# Patient Record
Sex: Female | Born: 1950
Health system: Southern US, Community
[De-identification: ages and names within clinical notes are randomized; demographics above are authoritative.]

## PROBLEM LIST (undated history)

## (undated) DIAGNOSIS — R011 Cardiac murmur, unspecified: Secondary | ICD-10-CM

## (undated) DIAGNOSIS — B019 Varicella without complication: Secondary | ICD-10-CM

## (undated) DIAGNOSIS — M1990 Unspecified osteoarthritis, unspecified site: Secondary | ICD-10-CM

## (undated) DIAGNOSIS — G43909 Migraine, unspecified, not intractable, without status migrainosus: Secondary | ICD-10-CM

## (undated) DIAGNOSIS — J45909 Unspecified asthma, uncomplicated: Secondary | ICD-10-CM

## (undated) DIAGNOSIS — T7840XA Allergy, unspecified, initial encounter: Secondary | ICD-10-CM

## (undated) DIAGNOSIS — I639 Cerebral infarction, unspecified: Secondary | ICD-10-CM

## (undated) HISTORY — DX: Allergy, unspecified, initial encounter: T78.40XA

## (undated) HISTORY — PX: COLONOSCOPY: SHX174

## (undated) HISTORY — DX: Unspecified osteoarthritis, unspecified site: M19.90

## (undated) HISTORY — DX: Varicella without complication: B01.9

## (undated) HISTORY — DX: Cerebral infarction, unspecified: I63.9

## (undated) HISTORY — DX: Cardiac murmur, unspecified: R01.1

## (undated) HISTORY — DX: Migraine, unspecified, not intractable, without status migrainosus: G43.909

## (undated) HISTORY — DX: Unspecified asthma, uncomplicated: J45.909

---
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
## 2017-03-23 ENCOUNTER — Ambulatory Visit (INDEPENDENT_AMBULATORY_CARE_PROVIDER_SITE_OTHER): Payer: BLUE CROSS/BLUE SHIELD | Admitting: Family Medicine

## 2017-03-23 ENCOUNTER — Encounter: Payer: Self-pay | Admitting: Gastroenterology

## 2017-03-23 ENCOUNTER — Encounter: Payer: Self-pay | Admitting: Family Medicine

## 2017-03-23 VITALS — BP 136/80 | HR 97 | Resp 12 | Ht 66.93 in | Wt 165.2 lb

## 2017-03-23 DIAGNOSIS — Z13220 Encounter for screening for lipoid disorders: Secondary | ICD-10-CM

## 2017-03-23 DIAGNOSIS — M7541 Impingement syndrome of right shoulder: Secondary | ICD-10-CM

## 2017-03-23 DIAGNOSIS — Z131 Encounter for screening for diabetes mellitus: Secondary | ICD-10-CM | POA: Diagnosis not present

## 2017-03-23 DIAGNOSIS — M25511 Pain in right shoulder: Secondary | ICD-10-CM

## 2017-03-23 DIAGNOSIS — Z1211 Encounter for screening for malignant neoplasm of colon: Secondary | ICD-10-CM

## 2017-03-23 LAB — BASIC METABOLIC PANEL
BUN: 14 mg/dL (ref 6–23)
CO2: 27 mEq/L (ref 19–32)
Calcium: 9.5 mg/dL (ref 8.4–10.5)
Chloride: 105 mEq/L (ref 96–112)
Creatinine, Ser: 0.74 mg/dL (ref 0.40–1.20)
GFR: 101.37 mL/min (ref >60.00)
Glucose, Bld: 110 mg/dL — ABNORMAL HIGH (ref 70–99)
Potassium: 4 mEq/L (ref 3.5–5.1)
Sodium: 140 mEq/L (ref 135–145)

## 2017-03-23 LAB — LIPID PANEL
Cholesterol: 226 mg/dL — ABNORMAL HIGH (ref 0–200)
HDL: 58.9 mg/dL (ref >39.00)
LDL Cholesterol: 141 mg/dL — ABNORMAL HIGH (ref 0–99)
NonHDL: 166.96
Total CHOL/HDL Ratio: 4
Triglycerides: 130 mg/dL (ref 0.0–149.0)
VLDL: 26 mg/dL (ref 0.0–40.0)

## 2017-03-23 LAB — CBC
HCT: 44 % (ref 36.0–46.0)
Hemoglobin: 14.2 g/dL (ref 12.0–15.0)
MCHC: 32.2 g/dL (ref 30.0–36.0)
MCV: 81.3 fl (ref 78.0–100.0)
Platelets: 229 10*3/uL (ref 150.0–400.0)
RBC: 5.42 Mil/uL — ABNORMAL HIGH (ref 3.87–5.11)
RDW: 13.8 % (ref 11.5–15.5)
WBC: 6.3 10*3/uL (ref 4.0–10.5)

## 2017-03-23 NOTE — Progress Notes (Addendum)
HPI:   Leah Pierce is a 66 y.o. female, who is here today with her female friend to establish care.  Former PCP: Dr Terri Skains in [place]. Moved to this area in 02/2016.  Last preventive routine visit: 2016 Colonoscopy 2006-2007  Chronic medical problems: asthma and migraine headaches.   Concerns today: Shoulder pain.  Having right shoulder pain since 12/2016. She was trying to "undo" her bra when she felt a "pop" and sudden pain. Pain is intermittent, throbbing,exacerbated by picking heavy objects and alleviated by rest. It is mild, getting better but concerned because it is not resolved. Sometimes pain is radiated to right trapezium and cervical area as well as mid right arm. Occasionally tingling sensation on fingertips,bilateral.  She denies weakness.  She has not tried OTC medication.  -She is fating today and would like labs done, lipid and diabetes screening.   Review of Systems  Constitutional: Negative for activity change, appetite change, fatigue, fever and unexpected weight change.  HENT: Negative for mouth sores, nosebleeds and trouble swallowing.   Eyes: Negative for redness and visual disturbance.  Respiratory: Negative for cough, shortness of breath and wheezing.   Cardiovascular: Negative for chest pain, palpitations and leg swelling.  Gastrointestinal: Negative for abdominal pain, nausea and vomiting.       Negative for changes in bowel habits.  Endocrine: Negative for cold intolerance, heat intolerance, polydipsia, polyphagia and polyuria.  Genitourinary: Negative for decreased urine volume and hematuria.  Musculoskeletal: Positive for arthralgias and neck pain. Negative for joint swelling.  Skin: Negative for color change and rash.  Allergic/Immunologic: Positive for environmental allergies.  Neurological: Negative for syncope, weakness and headaches.  Hematological: Negative for adenopathy. Does not bruise/bleed easily.    Psychiatric/Behavioral: Negative for confusion and sleep disturbance. The patient is not nervous/anxious.      No current outpatient prescriptions on file prior to visit.   No current facility-administered medications on file prior to visit.      Past Medical History:  Diagnosis Date  . Allergy   . Asthma   . Chicken pox   . Migraines    Not on File  Family History  Problem Relation Age of Onset  . Arthritis Mother   . Stroke Mother   . Hypertension Mother   . Diabetes Mother   . Arthritis Father   . Cancer Father     Prostate  . Cancer Brother     prostate  . Cancer Brother     Pancreas    Social History   Social History  . Marital status: Single    Spouse name: N/A  . Number of children: N/A  . Years of education: N/A   Social History Main Topics  . Smoking status: Never Smoker  . Smokeless tobacco: Never Used  . Alcohol use Yes  . Drug use: No  . Sexual activity: Not Asked   Other Topics Concern  . None   Social History Narrative  . None    Vitals:   03/23/17 0824  BP: 136/80  Pulse: 97  Resp: 12  O2 sat at RA 98%  Body mass index is 25.94 kg/m.   Physical Exam  Nursing note and vitals reviewed. Constitutional: She is oriented to person, place, and time. She appears well-developed and well-nourished. No distress.  HENT:  Head: Atraumatic.  Mouth/Throat: Oropharynx is clear and moist and mucous membranes are normal.  Eyes: Conjunctivae and EOM are normal.  Neck: No tracheal deviation present. No  thyroid mass and no thyromegaly present.  Cardiovascular: Normal rate and regular rhythm.   No murmur heard. Pulses:      Dorsalis pedis pulses are 2+ on the right side, and 2+ on the left side.  Respiratory: Effort normal and breath sounds normal. No respiratory distress.  GI: Soft. She exhibits no mass. There is no hepatomegaly. There is no tenderness.  Musculoskeletal: She exhibits no edema.  Right shoulder: Pain elicited with Juanetta Gosling'  test, empty can supraspinatus test,lift-Off Subscapularis test.  ROM elicits pain. Active abduction mildly limited, passive movement normal. Mild pain upon palpation of right trapezium muscle. No tenderness upon palpation of cervical paraspinal muscles and no limitation of ROM.  Lymphadenopathy:    She has no cervical adenopathy.  Neurological: She is alert and oriented to person, place, and time. She has normal strength. Gait normal.  Skin: Skin is warm. No rash noted. No erythema.  Psychiatric: She has a normal mood and affect.  Well groomed, good eye contact.     ASSESSMENT AND PLAN:   Leah Pierce was seen today for establish care.  Diagnoses and all orders for this visit:  Lab Results  Component Value Date   WBC 6.3 03/23/2017   HGB 14.2 03/23/2017   HCT 44.0 03/23/2017   MCV 81.3 03/23/2017   PLT 229.0 03/23/2017   Lab Results  Component Value Date   CHOL 226 (H) 03/23/2017   HDL 58.90 03/23/2017   LDLCALC 141 (H) 03/23/2017   TRIG 130.0 03/23/2017   CHOLHDL 4 03/23/2017   Lab Results  Component Value Date   CREATININE 0.74 03/23/2017   BUN 14 03/23/2017   NA 140 03/23/2017   K 4.0 03/23/2017   CL 105 03/23/2017   CO2 27 03/23/2017    Right shoulder pain, unspecified chronicity  Improving. Possible causes discussed: OA, rotator cuff tendinitis,radicular pain among some. Further recommendations will be given according to imaging results.   -     DG Shoulder Right; Future -     DG Cervical Spine Complete; Future -     CBC -     Ambulatory referral to Physical Therapy  Impingement syndrome, shoulder, right  She agrees with trying PT evaluation and treatment. OTC Tylenol, NSAID's, or topical Icy hot may help.  -     Ambulatory referral to Physical Therapy  Diabetes mellitus screening -     Basic metabolic panel  Lipid screening -     Lipid panel  Colon cancer screening -     Ambulatory referral to Gastroenterology   Next OV CPE.    Leah G.  [place], MD  [facility] [place] office.

## 2017-03-23 NOTE — Progress Notes (Signed)
Pre visit review using our clinic review tool, if applicable. No additional management support is needed unless otherwise documented below in the visit note. 

## 2017-03-23 NOTE — Patient Instructions (Signed)
A few things to remember from today's visit:   Right shoulder pain, unspecified chronicity - Plan: DG Shoulder Right, DG Cervical Spine Complete, CBC, Ambulatory referral to Physical Therapy  Impingement syndrome, shoulder, right - Plan: Ambulatory referral to Physical Therapy  Diabetes mellitus screening - Plan: Basic metabolic panel  Lipid screening - Plan: Lipid panel  Colon cancer screening - Plan: Ambulatory referral to Gastroenterology   Please be sure medication list is accurate. If a new problem present, please set up appointment sooner than planned today.

## 2017-03-24 ENCOUNTER — Ambulatory Visit (INDEPENDENT_AMBULATORY_CARE_PROVIDER_SITE_OTHER)
Admission: RE | Admit: 2017-03-24 | Discharge: 2017-03-24 | Disposition: A | Discharge status: Home / Self Care | Payer: BLUE CROSS/BLUE SHIELD | Source: Ambulatory Visit | Attending: Family Medicine | Admitting: Family Medicine

## 2017-03-24 DIAGNOSIS — M25511 Pain in right shoulder: Secondary | ICD-10-CM

## 2017-03-31 ENCOUNTER — Ambulatory Visit: Payer: BLUE CROSS/BLUE SHIELD | Attending: Family Medicine

## 2017-03-31 DIAGNOSIS — M25511 Pain in right shoulder: Secondary | ICD-10-CM | POA: Insufficient documentation

## 2017-03-31 DIAGNOSIS — R293 Abnormal posture: Secondary | ICD-10-CM

## 2017-03-31 DIAGNOSIS — G8929 Other chronic pain: Secondary | ICD-10-CM | POA: Insufficient documentation

## 2017-03-31 DIAGNOSIS — M25611 Stiffness of right shoulder, not elsewhere classified: Secondary | ICD-10-CM | POA: Insufficient documentation

## 2017-03-31 NOTE — Therapy (Signed)
[facility] [address], [place], [place], [address] Phone: [phone]   Fax:  [phone]  Physical Therapy Evaluation  Patient Details  Name: Leah Pierce MRN: [id number] Date of Birth: 1951-02-20 Referring Provider: Martinique, Betty, MD  Encounter Date: 03/31/2017      PT End of Session - 03/31/17 1222    Visit Number 1   Date for PT Re-Evaluation 05/26/17   PT Start Time 1134   PT Stop Time 1217   PT Time Calculation (min) 43 min   Activity Tolerance Patient tolerated treatment well   Behavior During Therapy [facility] for tasks assessed/performed      Past Medical History:  Diagnosis Date  . Allergy   . Asthma   . Chicken pox   . Migraines     Past Surgical History:  Procedure Laterality Date  . TONSILLECTOMY  1959    There were no vitals filed for this visit.       Subjective Assessment - 03/31/17 1136    Subjective Pt is a Rt hand dominant female who presents to PT with compliants of Rt shoulder pain that began 12/2016.  Pt reached behind her back to fasten her bra and felt immediate pain.     Diagnostic tests x-ray of shoulder negative.  Cervical x-ray: DDD at C5-6   Patient Stated Goals reduce Rt shoulder pain, reach overhead with Rt UE, fasten bra with Rt UE   Currently in Pain? Yes   Pain Score 0-No pain  up 5/10 with aggravating activities   Pain Location Shoulder   Pain Orientation Right   Pain Descriptors / Indicators Throbbing   Pain Type Chronic pain   Pain Onset More than a month ago   Pain Frequency Intermittent   Aggravating Factors  reaching overhead, lifting/laundry, reaching behind the back   Pain Relieving Factors somteimes heat            [facility] PT Assessment - 03/31/17 0001      Assessment   Medical Diagnosis Rt shoulder pain, impingement syndrome, shoulder, right   Referring Provider Martinique, Betty, MD   Onset Date/Surgical Date 01/01/17   Hand Dominance Right    Next MD Visit 6 months     Precautions   Precautions None     Restrictions   Weight Bearing Restrictions No     Balance Screen   Has the patient fallen in the past 6 months No   Has the patient had a decrease in activity level because of a fear of falling?  No   Is the patient reluctant to leave their home because of a fear of falling?  No     Home Ecologist residence   Living Arrangements Spouse/significant other   Type of [place] Two level     Prior Function   Level of [place] Retired   Leisure [employer], walking      Cognition   Overall Cognitive Status Within Functional Limits for tasks assessed     Observation/Other Assessments   Focus on Therapeutic Outcomes (FOTO)  49% limitation     Posture/Postural Control   Posture/Postural Control Postural limitations   Postural Limitations Forward head;Rounded Shoulders     ROM / Strength   AROM / PROM / Strength AROM;PROM;Strength     AROM   Overall AROM  Deficits   Overall AROM Comments Rt=Lt shoulder flexion and abduction limited  by 25% with pain on the Rt.   AROM Assessment Site Shoulder   Right/Left Shoulder Right   Right Shoulder Internal Rotation --  lacking 9 inches vs the Lt and more lateral   Right Shoulder External Rotation --  lacking 2 inches vs the Lt     PROM   Overall PROM  Deficits   Overall PROM Comments Lt P/ROM is full   PROM Assessment Site Shoulder   Right/Left Shoulder Right   Right Shoulder Flexion 143 Degrees   Right Shoulder ABduction 83 Degrees   Right Shoulder Internal Rotation 43 Degrees   Right Shoulder External Rotation 48 Degrees     Strength   Overall Strength Deficits   Strength Assessment Site Shoulder   Right/Left Shoulder Right;Left   Right Shoulder Flexion 4/5   Right Shoulder ABduction 4-/5   Right Shoulder Internal Rotation 4+/5   Right Shoulder External Rotation 4/5   Left  Shoulder Flexion 4+/5   Left Shoulder ABduction 4+/5   Left Shoulder Internal Rotation 5/5   Left Shoulder External Rotation 5/5     Palpation   Palpation comment palpable tenderness over anterior aspect of Rt glenohumeral joint and trigger point in Rt upper traps.  Decreased inferior glide.     Ambulation/Gait   Ambulation/Gait Yes   Ambulation/Gait Assistance 7: Independent                   [facility] Adult PT Treatment/Exercise - 03/31/17 0001      Modalities   Modalities Iontophoresis     Iontophoresis   Type of Iontophoresis Dexamethasone   Location Rt anterior glenohumeral joint   Dose 1.0   #1   Time 6 hour wear                PT Education - 03/31/17 1213    Education provided Yes   Education Details shoulder A/AROM, posture education, scap squeezes, Psychologist, clinical) Educated Patient;Other (comment)   Methods Explanation;Demonstration;Handout   Comprehension Verbalized understanding;Returned demonstration          PT Short Term Goals - 03/31/17 1229      PT SHORT TERM GOAL #1   Title be independent in initial HEP   Time 4   Period Weeks   Status New     PT SHORT TERM GOAL #2   Title demonstrate Rt shoulder A/ROM IR to lacking < or = to 5 inches vs the Lt to improve self-care   Time 4   Period Weeks   Status New     PT SHORT TERM GOAL #3   Title report a 30% reduction in Rt shoulder pain with lifting and reaching   Time 4   Period Weeks   Status New     PT SHORT TERM GOAL #4   Title report 25% improvement in quality and quantity of sleep   Time 4   Period Weeks   Status New     PT SHORT TERM GOAL #5   Title demonstrate and verbalize understanding of correct posture and report making corrections at home   Time 4   Period Weeks   Status New           PT Long Term Goals - 03/31/17 1132      PT LONG TERM GOAL #1   Title be independent in advanced HEP   Time 8   Period Weeks   Status New     PT LONG TERM GOAL #2  Title reduce FOTO to < or = to 33% limitation   Time 8   Period Weeks   Status New     PT LONG TERM GOAL #3   Title report a 75% reduction in Rt shoulder pain with reaching and lifitng   Time 8   Period Weeks   Status New     PT LONG TERM GOAL #4   Title demonstrate Rt shoulder A/ROM IR to lacking < or = to 3 inches vs the Lt to improve self-care   Time 8   Period Weeks   Status New     PT LONG TERM GOAL #5   Title report no sleep limitations due to Rt shoulder pain   Time 8   Period Weeks   Status New               Plan - 03/31/17 1223    Clinical Impression Statement Pt is a Rt hand dominant female who presents to PT with Rt shoulder pain that began 12/2016 when she reached back to fasten her bra.  X-ray of neck showerd C5-6 DDD and Rt shoulder was normal.  Pt demonstrates limited Rt shoulder A/ROM, P/ROM and strength.  Pt with painful Rt shoulder mobility.  Pt with palpable tenderness over Rt anterior glenohumeral joint and trigger points in Rt upper trap.  Pt reports up to 5/10 Rt shoulder pain with lifting and reaching over head or behind the back and pt wakes with pain at night.  Pt with poor seated posture with forward shoulders and head.  Pt will benefit from skilled PT for postural strength, shoulder ROM and mobilization, ionto and modalities.     Rehab Potential Good   PT Frequency 2x / week   PT Duration 8 weeks   PT Treatment/Interventions ADLs/Self Care Home Management;Cryotherapy;Electrical Stimulation;Iontophoresis 1m/ml Dexamethasone;Ultrasound;Moist Heat;Therapeutic activities;Therapeutic exercise;Neuromuscular re-education;Patient/family education;Passive range of motion;Manual techniques;Dry needling;Taping   PT Next Visit Plan postural strength, Rt shoulder flexibility, ionto #2, shoulder mobs, modalities   Consulted and Agree with Plan of Care Patient      Patient will benefit from skilled therapeutic intervention in order to improve the following  deficits and impairments:  Decreased range of motion, Pain, Decreased endurance, Increased muscle spasms, Postural dysfunction, Decreased strength, Hypomobility, Impaired flexibility, Improper body mechanics, Decreased activity tolerance  Visit Diagnosis: Chronic right shoulder pain - Plan: PT plan of care cert/re-cert  Abnormal posture - Plan: PT plan of care cert/re-cert  Stiffness of right shoulder, not elsewhere classified - Plan: PT plan of care cert/re-cert     Problem List There are no active problems to display for this patient.    KSigurd Sos PT 03/31/17 12:38 PM  [facility] [address] [place] [address]Phone: [phone]  Fax:  [phone] Name: MCamden MazzaferroMRN: [id number] of Birth: [birth year]

## 2017-03-31 NOTE — Patient Instructions (Addendum)
Hold all stretches 5-10 seconds and perform 5-10 times, 3 times a day.   Slide right arm up wall, with  PALM FACING THE WALL, by leaning toward wall.    Copyright  VHI. All rights reserved.    Clasp hands together and raise arms above head, keeping elbows as straight as possible. Can be done sitting or lying.     One hand on opposite side of head, pull head to side as far as is comfortable. Stop if there is pain. Hold ____ seconds. Repeat with other hand to other side. Repeat ____ times. Do ____ sessions per day.   Copyright  VHI. All rights reserved.  Scapular Retraction (Standing)    Hold 5 seconds, do 10.  Many times a day.       Posture - Standing   Good posture is important. Avoid slouching and forward head thrust. Maintain curve in low back and align ears over shoulders, hips over ankles.  Pull your belly button in toward your back bone. Posture Tips DO: - stand tall and erect - keep chin tucked in - keep head and shoulders in alignment - check posture regularly in mirror or large window - pull head back against headrest in car seat;  Change your position often.  Sit with lumbar support. DON'T: - slouch or slump while watching TV or reading - sit, stand or lie in one position  for too long;  Sitting is especially hard on the spine so if you sit at a desk/use the computer, then stand up often! Copyright  VHI. All rights reserved.  Posture - Sitting  Sit upright, head facing forward. Try using a roll to support lower back. Keep shoulders relaxed, and avoid rounded back. Keep hips level with knees. Avoid crossing legs for long periods. Copyright  VHI. All rights reserved.  Chronic neck strain can develop because of poor posture and faulty work habits  Postural strain related to slumped sitting and forward head posture is a leading cause of headaches, neck and upper back pain  General strengthening and flexibility exercises are helpful in the treatment of neck pain.   Most importantly, you should learn to correct the posture that may be contributing to chronic pain.   Change positions frequently  Change your work or home environment to improve posture and mechanics.   AROM: Lateral Neck Flexion    Slowly tilt head toward one shoulder, then the other. Hold each position _20___ seconds. Repeat __3__ times per set. Do __1__ sets per session. Do _3___ sessions per day.  http://orth.exer.us/297   Copyright  VHI. All rights reserved.   IONTOPHORESIS PATIENT PRECAUTIONS & CONTRAINDICATIONS:  . Redness under one or both electrodes can occur.  This characterized by a uniform redness that usually disappears within 12 hours of treatment. . Small pinhead size blisters may result in response to the drug.  Contact your physician if the problem persists more than 24 hours. . On rare occasions, iontophoresis therapy can result in temporary skin reactions such as rash, inflammation, irritation or burns.  The skin reactions may be the result of individual sensitivity to the ionic solution used, the condition of the skin at the start of treatment, reaction to the materials in the electrodes, allergies or sensitivity to dexamethasone, or a poor connection between the patch and your skin.  Discontinue using iontophoresis if you have any of these reactions and report to your therapist. . Remove the Patch or electrodes if you have any undue sensation of pain or  burning during the treatment and report discomfort to your therapist. . Tell your Therapist if you have had known adverse reactions to the application of electrical current. . If using the Patch, the LED light will turn off when treatment is complete and the patch can be removed.  Approximate treatment time is 1-3 hours.  Remove the patch when light goes off or after 6 hours. . The Patch can be worn during normal activity, however excessive motion where the electrodes have been placed can cause poor contact between  the skin and the electrode or uneven electrical current resulting in greater risk of skin irritation. Marland Kitchen Keep out of the reach of children.   . DO NOT use if you have a cardiac pacemaker or any other electrically sensitive implanted device. . DO NOT use if you have a known sensitivity to dexamethasone. . DO NOT use during Magnetic Resonance Imaging (MRI). . DO NOT use over broken or compromised skin (e.g. sunburn, cuts, or acne) due to the increased risk of skin reaction. . DO NOT SHAVE over the area to be treated:  To establish good contact between the Patch and the skin, excessive hair may be clipped. . DO NOT place the Patch or electrodes on or over your eyes, directly over your heart, or brain. . DO NOT reuse the Patch or electrodes as this may cause burns to occur. [facility] [address], [address] [place], [place] [address] Phone # [phone] Fax [phone]

## 2017-04-01 ENCOUNTER — Encounter: Payer: Self-pay | Admitting: Physical Therapy

## 2017-04-01 ENCOUNTER — Ambulatory Visit: Payer: BLUE CROSS/BLUE SHIELD | Admitting: Physical Therapy

## 2017-04-01 DIAGNOSIS — R293 Abnormal posture: Secondary | ICD-10-CM

## 2017-04-01 DIAGNOSIS — M25611 Stiffness of right shoulder, not elsewhere classified: Secondary | ICD-10-CM

## 2017-04-01 DIAGNOSIS — M25511 Pain in right shoulder: Secondary | ICD-10-CM | POA: Diagnosis not present

## 2017-04-01 DIAGNOSIS — G8929 Other chronic pain: Secondary | ICD-10-CM

## 2017-04-01 NOTE — Therapy (Signed)
[facility] [address] [place], [place], [address] Phone: [phone]   Fax:  [phone]  Physical Therapy Treatment  Patient Details  Name: Leah Pierce MRN: [id number] Date of Birth: Dec 14, 1950 Referring Provider: [place], Betty, MD  Encounter Date: 04/01/2017      PT End of Session - 04/01/17 1224    Visit Number 2   Date for PT Re-Evaluation 05/26/17   PT Start Time 1225   PT Stop Time 1305   PT Time Calculation (min) 40 min   Activity Tolerance Patient tolerated treatment well   Behavior During Therapy [facility] for tasks assessed/performed      Past Medical History:  Diagnosis Date  . Allergy   . Asthma   . Chicken pox   . Migraines     Past Surgical History:  Procedure Laterality Date  . TONSILLECTOMY  1959    There were no vitals filed for this visit.      Subjective Assessment - 04/01/17 1225    Subjective Pt was sore after eval. First patch didn't help much but it also did not stay on very well.   Diagnostic tests x-ray of shoulder negative.  Cervical x-ray: DDD at C5-6   Patient Stated Goals reduce Rt shoulder pain, reach overhead with Rt UE, fasten bra with Rt UE   Pain Score 4    Pain Location Shoulder   Pain Orientation Right   Pain Descriptors / Indicators Throbbing   Multiple Pain Sites No            [facility] PT Assessment - 04/01/17 0001      PROM   Right Shoulder ABduction 110 Degrees                     [facility] Adult PT Treatment/Exercise - 04/01/17 0001      Shoulder Exercises: Seated   External Rotation Strengthening;Both;20 reps;Theraband   Theraband Level (Shoulder External Rotation) Level 1 (Yellow)   Other Seated Exercises Shld circles back 10x     Shoulder Exercises: Standing   Extension Strengthening;Both;10 reps;Theraband   Theraband Level (Shoulder Extension) Level 2 (Red)   Row Strengthening;Both;10 reps;Theraband   Theraband Level  (Shoulder Row) Level 2 (Red)   Other Standing Exercises cone stack to second shelf 8x 2 sets   Other Standing Exercises UE ranger 10x circles each directiion     Shoulder Exercises: Pulleys   Flexion 3 minutes   ABduction --  scaption  x 3 min     Shoulder Exercises: ROM/Strengthening   UBE (Upper Arm Bike) L1 2x2 with postural focus   Other ROM/Strengthening Exercises Finger ladder 10x flexion reaching #21     Iontophoresis   Type of Iontophoresis Dexamethasone   Location Rt anterior glenohumeral joint  Used alcohol swab for oily skin   Dose --  #2 1 ml skin intact   Time 6 hour wear                PT Education - 03/31/17 1213    Education provided Yes   Education Details shoulder A/AROM, posture education, scap squeezes, Research scientist (life sciences)) Educated Patient;Other (comment)   Methods Explanation;Demonstration;Handout   Comprehension Verbalized understanding;Returned demonstration          PT Short Term Goals - 03/31/17 1229      PT SHORT TERM GOAL #1   Title be independent in initial HEP   Time 4   Period Weeks  Status New     PT SHORT TERM GOAL #2   Title demonstrate Rt shoulder A/ROM IR to lacking < or = to 5 inches vs the Lt to improve self-care   Time 4   Period Weeks   Status New     PT SHORT TERM GOAL #3   Title report a 30% reduction in Rt shoulder pain with lifting and reaching   Time 4   Period Weeks   Status New     PT SHORT TERM GOAL #4   Title report 25% improvement in quality and quantity of sleep   Time 4   Period Weeks   Status New     PT SHORT TERM GOAL #5   Title demonstrate and verbalize understanding of correct posture and report making corrections at home   Time 4   Period Weeks   Status New           PT Long Term Goals - 03/31/17 1132      PT LONG TERM GOAL #1   Title be independent in advanced HEP   Time 8   Period Weeks   Status New     PT LONG TERM GOAL #2   Title reduce FOTO to < or = to 33%  limitation   Time 8   Period Weeks   Status New     PT LONG TERM GOAL #3   Title report a 75% reduction in Rt shoulder pain with reaching and lifitng   Time 8   Period Weeks   Status New     PT LONG TERM GOAL #4   Title demonstrate Rt shoulder A/ROM IR to lacking < or = to 3 inches vs the Lt to improve self-care   Time 8   Period Weeks   Status New     PT LONG TERM GOAL #5   Title report no sleep limitations due to Rt shoulder pain   Time 8   Period Weeks   Status New               Plan - 04/01/17 1224    Clinical Impression Statement AROM alsready showing signs or improving, pain startin gto come down. Pt tolerated many exercises today with only complaints of being tired, no increased pain. She is independent & compliant her initial HEP, meeting this goal.     Rehab Potential Good   PT Frequency 2x / week   PT Duration 8 weeks   PT Treatment/Interventions ADLs/Self Care Home Management;Cryotherapy;Electrical Stimulation;Iontophoresis 4mg /ml Dexamethasone;Ultrasound;Moist Heat;Therapeutic activities;Therapeutic exercise;Neuromuscular re-education;Patient/family education;Passive range of motion;Manual techniques;Dry needling;Taping   PT Next Visit Plan Ionto #3, shoulder ROM and strength, shoulder mobs   Consulted and Agree with Plan of Care --      Patient will benefit from skilled therapeutic intervention in order to improve the following deficits and impairments:  Decreased range of motion, Pain, Decreased endurance, Postural dysfunction, Decreased strength, Hypomobility, Impaired flexibility, Improper body mechanics, Decreased activity tolerance  Visit Diagnosis: Chronic right shoulder pain  Abnormal posture  Stiffness of right shoulder, not elsewhere classified     Problem List There are no active problems to display for this patient.   Antoine Fiallos, PTA 04/01/2017, 1:07 PM  [facility] [address] [place], [place], [address] Phone: [phone]   Fax:  [phone]  Name: Jaydalynn Olivero MRN: [id number] Date of Birth: 18-Sep-1951

## 2017-04-06 ENCOUNTER — Encounter: Payer: Self-pay | Admitting: Physical Therapy

## 2017-04-06 ENCOUNTER — Ambulatory Visit: Payer: BLUE CROSS/BLUE SHIELD | Admitting: Physical Therapy

## 2017-04-06 DIAGNOSIS — R293 Abnormal posture: Secondary | ICD-10-CM

## 2017-04-06 DIAGNOSIS — G8929 Other chronic pain: Secondary | ICD-10-CM

## 2017-04-06 DIAGNOSIS — M25511 Pain in right shoulder: Principal | ICD-10-CM

## 2017-04-06 DIAGNOSIS — M25611 Stiffness of right shoulder, not elsewhere classified: Secondary | ICD-10-CM

## 2017-04-06 NOTE — Therapy (Signed)
[facility] [address], [address] [place], [place], [address] Phone: [phone]   Fax:  [phone]  Physical Therapy Treatment  Patient Details  Name: Leah Pierce MRN: [id number] Date of Birth: 1951-09-09 Referring Provider: [place], Betty, MD  Encounter Date: 04/06/2017      PT End of Session - 04/06/17 1227    Visit Number 3   Date for PT Re-Evaluation 05/26/17   PT Start Time 1227   PT Stop Time 1306   PT Time Calculation (min) 39 min   Activity Tolerance Patient tolerated treatment well   Behavior During Therapy [facility] for tasks assessed/performed      Past Medical History:  Diagnosis Date  . Allergy   . Asthma   . Chicken pox   . Migraines     Past Surgical History:  Procedure Laterality Date  . TONSILLECTOMY  1959    There were no vitals filed for this visit.      Subjective Assessment - 04/06/17 1230    Subjective Pain about the same, my motion is getting better, sleeping about the same.    Diagnostic tests x-ray of shoulder negative.  Cervical x-ray: DDD at C5-6   Patient Stated Goals reduce Rt shoulder pain, reach overhead with Rt UE, fasten bra with Rt UE   Currently in Pain? Yes   Pain Score 4    Pain Location Shoulder   Pain Orientation Right   Pain Descriptors / Indicators Sore;Throbbing   Aggravating Factors  Reaching   Pain Relieving Factors sometimes heat?   Multiple Pain Sites No                         [facility] Adult PT Treatment/Exercise - 04/06/17 0001      Self-Care   Self-Care Posture   Posture Review of posture with focus on where she puts her chin.     Therapeutic Activites    Therapeutic Activities --  Self UT release with theracane: where to buy     Shoulder Exercises: Standing   Internal Rotation AAROM;Both;10 reps   Row Strengthening;Both;10 reps;Theraband   Theraband Level (Shoulder Row) Level 2 (Red)     Shoulder Exercises: Pulleys   Flexion  3 minutes   ABduction Limitations 3 min  some pain     Shoulder Exercises: ROM/Strengthening   UBE (Upper Arm Bike) L1 3x3  postural focus    Other ROM/Strengthening Exercises Finger ladder 10x flexion reaching #21  #22     Shoulder Exercises: Stretch   Other Shoulder Stretches Theracane trigger point release to RTUT      Iontophoresis   Type of Iontophoresis Dexamethasone   Location Rt anterior glenohumeral joint  Used alcohol swab for oily skin   Dose 1 ml  #3 skin intact   Time 6 hour wear                PT Education - 04/06/17 1257    Education provided Yes   Education Details IR behind the back stretch   Person(s) Educated Patient   Methods Explanation;Demonstration;Tactile cues;Verbal cues;Handout   Comprehension Verbalized understanding;Returned demonstration          PT Short Term Goals - 04/06/17 1228      PT SHORT TERM GOAL #1   Title be independent in initial HEP   Time 4   Period Weeks   Status Achieved     PT SHORT TERM GOAL #3   Title  report a 30% reduction in Rt shoulder pain with lifting and reaching   Time 4   Period Weeks   Status On-going  pain about the same     PT SHORT TERM GOAL #4   Title report 25% improvement in quality and quantity of sleep   Time 4   Period Weeks   Status On-going     PT SHORT TERM GOAL #5   Title demonstrate and verbalize understanding of correct posture and report making corrections at home   Time 4   Period Weeks   Status Achieved           PT Long Term Goals - 03/31/17 1132      PT LONG TERM GOAL #1   Title be independent in advanced HEP   Time 8   Period Weeks   Status New     PT LONG TERM GOAL #2   Title reduce FOTO to < or = to 33% limitation   Time 8   Period Weeks   Status New     PT LONG TERM GOAL #3   Title report a 75% reduction in Rt shoulder pain with reaching and lifitng   Time 8   Period Weeks   Status New     PT LONG TERM GOAL #4   Title demonstrate Rt shoulder  A/ROM IR to lacking < or = to 3 inches vs the Lt to improve self-care   Time 8   Period Weeks   Status New     PT LONG TERM GOAL #5   Title report no sleep limitations due to Rt shoulder pain   Time 8   Period Weeks   Status New               Plan - 04/06/17 1228    Clinical Impression Statement Pt reports her pain is about the same. Exercises do not increase pain. She is reaching higher in the shower and in her kitchen.  Sleeping is about the same , she feels becasue she sleeps on her shoulder this contributes to the pain. Pt was educated in how to use the theracane for home self release techniques for RT upper trap trigger points.  Pt is interested in purchasing.    Rehab Potential Good   PT Frequency 2x / week   PT Duration 8 weeks   PT Treatment/Interventions ADLs/Self Care Home Management;Cryotherapy;Electrical Stimulation;Iontophoresis 4mg /ml Dexamethasone;Ultrasound;Moist Heat;Therapeutic activities;Therapeutic exercise;Neuromuscular re-education;Patient/family education;Passive range of motion;Manual techniques;Dry needling;Taping   PT Next Visit Plan Ionto #4 shoulder AROM and strength   Consulted and Agree with Plan of Care Patient      Patient will benefit from skilled therapeutic intervention in order to improve the following deficits and impairments:  Decreased range of motion, Pain, Decreased endurance, Postural dysfunction, Decreased strength, Hypomobility, Impaired flexibility, Improper body mechanics, Decreased activity tolerance  Visit Diagnosis: Chronic right shoulder pain  Abnormal posture  Stiffness of right shoulder, not elsewhere classified     Problem List There are no active problems to display for this patient.   COCHRAN,JENNIFER, PTA 04/06/2017, 1:08 PM  [facility] [address] [place], [place], [address] Phone: [phone]   Fax:  [phone]  Name: Kella Splinter MRN: [id number] Date of Birth: 07-19-1951

## 2017-04-06 NOTE — Patient Instructions (Signed)
Chest / Shoulder Stretch: Yardstick Along Spine    Hold stick horizontally against buttocks, hands shoulder width apart. Bend elbows, and raise stick along spine. Hold 1 count. Slowly return to starting position. Repeat __10__ times. Do 3x day  Copyright  VHI. All rights reserved.

## 2017-04-08 ENCOUNTER — Encounter: Payer: Self-pay | Admitting: Physical Therapy

## 2017-04-08 ENCOUNTER — Ambulatory Visit: Payer: BLUE CROSS/BLUE SHIELD | Admitting: Physical Therapy

## 2017-04-08 DIAGNOSIS — G8929 Other chronic pain: Secondary | ICD-10-CM

## 2017-04-08 DIAGNOSIS — M25511 Pain in right shoulder: Secondary | ICD-10-CM | POA: Diagnosis not present

## 2017-04-08 DIAGNOSIS — M25611 Stiffness of right shoulder, not elsewhere classified: Secondary | ICD-10-CM

## 2017-04-08 DIAGNOSIS — R293 Abnormal posture: Secondary | ICD-10-CM

## 2017-04-08 NOTE — Therapy (Signed)
 [facility] [address], [address] [place], [place], [address] Phone: [phone]   Fax:  [phone]  Physical Therapy Treatment  Patient Details  Name: Leah Pierce MRN: [id number] Date of Birth: 07/20/1951 Referring Provider: [place], Betty, MD  Encounter Date: 04/08/2017      PT End of Session - 04/08/17 1220    Visit Number 4   Date for PT Re-Evaluation 05/26/17   PT Start Time 1220   PT Stop Time 1300   PT Time Calculation (min) 40 min   Activity Tolerance Patient tolerated treatment well   Behavior During Therapy [facility] for tasks assessed/performed      Past Medical History:  Diagnosis Date  . Allergy   . Asthma   . Chicken pox   . Migraines     Past Surgical History:  Procedure Laterality Date  . TONSILLECTOMY  1959    There were no vitals filed for this visit.      Subjective Assessment - 04/08/17 1221    Subjective The next day my shoulder felt much better. I already did laundry this AM.    Currently in Pain? Yes   Pain Score 4    Pain Location Shoulder   Pain Orientation Right   Pain Descriptors / Indicators Dull   Multiple Pain Sites No                         [facility] Adult PT Treatment/Exercise - 04/08/17 0001      Shoulder Exercises: Seated   Abduction AROM;Strengthening;Both;20 reps     Shoulder Exercises: Standing   Extension Strengthening;Both;20 reps;Theraband   Theraband Level (Shoulder Extension) Level 3 (Green)   Row Strengthening;Both;20 reps;Theraband   Theraband Level (Shoulder Row) Level 3 (Green)     Shoulder Exercises: Pulleys   Flexion 3 minutes   ABduction Limitations 3 min  some pain     Shoulder Exercises: ROM/Strengthening   UBE (Upper Arm Bike) L1 3x3  postural focus    Other ROM/Strengthening Exercises Finger ladder 10x scaption  reaching #21  #22     Shoulder Exercises: Stretch   Internal Rotation Stretch --  2x 10 with cane behind pt  back     Iontophoresis   Type of Iontophoresis Dexamethasone   Location Rt anterior glenohumeral joint  Used alcohol swab for oily skin   Dose 1 ml  #3 skin intact   Time 6 hour wear     Manual Therapy   Manual Therapy Soft tissue mobilization   Soft tissue mobilization Rt shoulder, bicep, forearm and upper trap                  PT Short Term Goals - 04/06/17 1228      PT SHORT TERM GOAL #1   Title be independent in initial HEP   Time 4   Period Weeks   Status Achieved     PT SHORT TERM GOAL #3   Title report a 30% reduction in Rt shoulder pain with lifting and reaching   Time 4   Period Weeks   Status On-going  pain about the same     PT SHORT TERM GOAL #4   Title report 25% improvement in quality and quantity of sleep   Time 4   Period Weeks   Status On-going     PT SHORT TERM GOAL #5   Title demonstrate and verbalize understanding of correct posture and report making corrections  at home   Time 4   Period Weeks   Status Achieved           PT Long Term Goals - 03/31/17 1132      PT LONG TERM GOAL #1   Title be independent in advanced HEP   Time 8   Period Weeks   Status New     PT LONG TERM GOAL #2   Title reduce FOTO to < or = to 33% limitation   Time 8   Period Weeks   Status New     PT LONG TERM GOAL #3   Title report a 75% reduction in Rt shoulder pain with reaching and lifitng   Time 8   Period Weeks   Status New     PT LONG TERM GOAL #4   Title demonstrate Rt shoulder A/ROM IR to lacking < or = to 3 inches vs the Lt to improve self-care   Time 8   Period Weeks   Status New     PT LONG TERM GOAL #5   Title report no sleep limitations due to Rt shoulder pain   Time 8   Period Weeks   Status New               Plan - 04/08/17 1221    Clinical Impression Statement Pain continues to come down slowly, pt had a good day yesterday she reports. Most difficulty is reaching behing her back.  She has not purcahsed a  theracane yet. Pt's soft tissue was fairly tight feeling from the upper trap all the way to the forearm. Pt was able to do laundry this AM with minimal pain.    PT Frequency 2x / week   PT Duration 8 weeks   PT Treatment/Interventions ADLs/Self Care Home Management;Cryotherapy;Electrical Stimulation;Iontophoresis 4mg /ml Dexamethasone;Ultrasound;Moist Heat;Therapeutic activities;Therapeutic exercise;Neuromuscular re-education;Patient/family education;Passive range of motion;Manual techniques;Dry needling;Taping   PT Next Visit Plan Ionto, IR & abduction ROM gentle strength. Manual to RT UE.    Consulted and Agree with Plan of Care Patient      Patient will benefit from skilled therapeutic intervention in order to improve the following deficits and impairments:  Decreased range of motion, Pain, Decreased endurance, Postural dysfunction, Decreased strength, Hypomobility, Impaired flexibility, Improper body mechanics, Decreased activity tolerance  Visit Diagnosis: Chronic right shoulder pain  Abnormal posture  Stiffness of right shoulder, not elsewhere classified     Problem List There are no active problems to display for this patient.   ,, PTA 04/08/2017, 1:03 PM  [facility] [address], [address] [place], [place], [address] Phone: [phone]   Fax:  [phone]  Name: Leah Pierce MRN: [id number] Date of Birth: 1951-10-20

## 2017-04-14 ENCOUNTER — Encounter: Payer: BLUE CROSS/BLUE SHIELD | Admitting: Physical Therapy

## 2017-04-16 ENCOUNTER — Encounter: Payer: BLUE CROSS/BLUE SHIELD | Admitting: Physical Therapy

## 2017-04-20 ENCOUNTER — Encounter: Payer: Self-pay | Admitting: Physical Therapy

## 2017-04-20 ENCOUNTER — Ambulatory Visit: Payer: BLUE CROSS/BLUE SHIELD | Attending: Family Medicine | Admitting: Physical Therapy

## 2017-04-20 DIAGNOSIS — R293 Abnormal posture: Secondary | ICD-10-CM | POA: Insufficient documentation

## 2017-04-20 DIAGNOSIS — M25611 Stiffness of right shoulder, not elsewhere classified: Secondary | ICD-10-CM | POA: Insufficient documentation

## 2017-04-20 DIAGNOSIS — M25511 Pain in right shoulder: Secondary | ICD-10-CM | POA: Diagnosis present

## 2017-04-20 DIAGNOSIS — G8929 Other chronic pain: Secondary | ICD-10-CM | POA: Diagnosis present

## 2017-04-20 NOTE — Therapy (Signed)
[facility] [address] [place], [place], [address] Phone: [phone]   Fax:  [phone]  Physical Therapy Treatment  Patient Details  Name: Leah Pierce MRN: [id number] Date of Birth: 1951-09-16 Referring Provider: [place], Betty, MD  Encounter Date: 04/20/2017      PT End of Session - 04/20/17 1023    Visit Number 5   Date for PT Re-Evaluation 05/26/17   PT Start Time 1015   PT Stop Time 1100   PT Time Calculation (min) 45 min   Activity Tolerance Patient tolerated treatment well   Behavior During Therapy [facility] for tasks assessed/performed      Past Medical History:  Diagnosis Date  . Allergy   . Asthma   . Chicken pox   . Migraines     Past Surgical History:  Procedure Laterality Date  . TONSILLECTOMY  1959    There were no vitals filed for this visit.      Subjective Assessment - 04/20/17 1021    Subjective Patient arriving today reporting 4/10 pain in her right shoulder. Pt reporting doing household chores over the weekend which increased pain.    Diagnostic tests x-ray of shoulder negative.  Cervical x-ray: DDD at C5-6   Patient Stated Goals reduce Rt shoulder pain, reach overhead with Rt UE, fasten bra with Rt UE   Currently in Pain? Yes   Pain Score 4    Pain Location Shoulder   Pain Orientation Right   Pain Descriptors / Indicators Aching;Throbbing   Pain Onset More than a month ago   Pain Frequency Intermittent   Aggravating Factors  reaching, household chores   Pain Relieving Factors heat   Multiple Pain Sites No                         [facility] Adult PT Treatment/Exercise - 04/20/17 0001      Shoulder Exercises: Seated   Abduction AROM;Strengthening;Both;20 reps     Shoulder Exercises: Standing   Extension Strengthening;Both;20 reps;Theraband   Theraband Level (Shoulder Extension) Level 3 (Green)   Row Strengthening;Both;20 reps;Theraband   Theraband  Level (Shoulder Row) Level 3 (Green)     Shoulder Exercises: Pulleys   Flexion 3 minutes   ABduction Limitations 3 min  some pain     Shoulder Exercises: ROM/Strengthening   UBE (Upper Arm Bike) L1 3x3  postural focus    Other ROM/Strengthening Exercises Finger ladder reaching to #22  x 10 times  #22     Modalities   Modalities Iontophoresis     Iontophoresis   Type of Iontophoresis Dexamethasone   Location Rt anterior glenohumeral joint  Used alcohol swab for oily skin   Dose 1 ml  #3 skin intact   Time 6 hour wear     Manual Therapy   Manual Therapy Soft tissue mobilization   Soft tissue mobilization right shoulder bicep, forearm and upper trap                PT Education - 04/20/17 1022    Education provided Yes   Education Details reviewed HEP verbally   Person(s) Educated Patient   Methods Explanation;Demonstration   Comprehension Returned demonstration;Verbalized understanding          PT Short Term Goals - 04/06/17 1228      PT SHORT TERM GOAL #1   Title be independent in initial HEP   Time 4   Period Weeks   Status  Achieved     PT SHORT TERM GOAL #3   Title report a 30% reduction in Rt shoulder pain with lifting and reaching   Time 4   Period Weeks   Status On-going  pain about the same     PT SHORT TERM GOAL #4   Title report 25% improvement in quality and quantity of sleep   Time 4   Period Weeks   Status On-going     PT SHORT TERM GOAL #5   Title demonstrate and verbalize understanding of correct posture and report making corrections at home   Time 4   Period Weeks   Status Achieved           PT Long Term Goals - 03/31/17 1132      PT LONG TERM GOAL #1   Title be independent in advanced HEP   Time 8   Period Weeks   Status New     PT LONG TERM GOAL #2   Title reduce FOTO to < or = to 33% limitation   Time 8   Period Weeks   Status New     PT LONG TERM GOAL #3   Title report a 75% reduction in Rt shoulder pain  with reaching and lifitng   Time 8   Period Weeks   Status New     PT LONG TERM GOAL #4   Title demonstrate Rt shoulder A/ROM IR to lacking < or = to 3 inches vs the Lt to improve self-care   Time 8   Period Weeks   Status New     PT LONG TERM GOAL #5   Title report no sleep limitations due to Rt shoulder pain   Time 8   Period Weeks   Status New               Plan - 04/20/17 1025    Clinical Impression Statement Pt reporting pain upon arrival reporting 4/10 pain in her r shoulder. Pt tolerated doing exercises well. Pt did reporting increased pain with shoulder abduction today. continued skilled PT to progress pt toward goals set.    Rehab Potential Good   PT Frequency 2x / week   PT Duration 8 weeks   PT Next Visit Plan Ionto, IR & abduction ROM gentle strength. Manual to RT UE.    Consulted and Agree with Plan of Care Patient      Patient will benefit from skilled therapeutic intervention in order to improve the following deficits and impairments:  Decreased range of motion, Pain, Decreased endurance, Postural dysfunction, Decreased strength, Hypomobility, Impaired flexibility, Improper body mechanics, Decreased activity tolerance  Visit Diagnosis: Chronic right shoulder pain  Abnormal posture  Stiffness of right shoulder, not elsewhere classified     Problem List There are no active problems to display for this patient.   Sharmon Leyden, MPT 04/20/2017, 10:37 AM  [facility] [address] [place], [place], [address] Phone: [phone]   Fax:  [phone]  Name: Leah Pierce MRN: [id number] Date of Birth: 1951-04-03

## 2017-04-22 ENCOUNTER — Ambulatory Visit: Payer: BLUE CROSS/BLUE SHIELD

## 2017-04-22 DIAGNOSIS — M25611 Stiffness of right shoulder, not elsewhere classified: Secondary | ICD-10-CM

## 2017-04-22 DIAGNOSIS — M25511 Pain in right shoulder: Secondary | ICD-10-CM | POA: Diagnosis not present

## 2017-04-22 DIAGNOSIS — G8929 Other chronic pain: Secondary | ICD-10-CM

## 2017-04-22 DIAGNOSIS — R293 Abnormal posture: Secondary | ICD-10-CM

## 2017-04-22 NOTE — Patient Instructions (Addendum)
KEEP HEAD IN NEUTRAL AND SHOULDERS DOWN AND RELAXED   Hold tubing in right hand, arm forward. Pull arm back, elbow straight.  Both arms at the same time.  Repeat __10__ times per set. Do __2__ sets per session. Do _1-2___ sessions per day.  Copyright  VHI. All rights reserved.     With resistive band anchored in door, grasp both ends. Keeping elbows bent, pull back, squeezing shoulder blades together. Hold _3__ seconds. Repeat _2x10___ times. Do _1-2___ sessions per day.  http://[email or web address].exer.us/98   SHOULDER: Flexion Unilateral (Weight)   Start with arm at side. Raise both arms forward and up to shoulder high  Keep elbows straight.  Use   lb weight.10  reps per set, 2-3___ sets per day.  Copyright  VHI. All rights reserved.  SHOULDER: Abduction (Weight)   Raise arm out and up. Keep elbow straight. Do not shrug shoulders. Use  # lb weight. _10__ reps per set, 2-_3__ sets per day.  Copyright  VHI. All rights reserved.  SHOULDER: Scaption (Weight)   Place arm at 45 angle to body. Raise arm up to shoulder level - shoulder keeping elbow straight.  Use   lb weight. _10__ reps per set, _2-3__ sets per day.  IONTOPHORESIS PATIENT PRECAUTIONS & CONTRAINDICATIONS:  . Redness under one or both electrodes can occur.  This characterized by a uniform redness that usually disappears within 12 hours of treatment. . Small pinhead size blisters may result in response to the drug.  Contact your physician if the problem persists more than 24 hours. . On rare occasions, iontophoresis therapy can result in temporary skin reactions such as rash, inflammation, irritation or burns.  The skin reactions may be the result of individual sensitivity to the ionic solution used, the condition of the skin at the start of treatment, reaction to the materials in the electrodes, allergies or sensitivity to dexamethasone, or a poor connection between the patch and your skin.  Discontinue using iontophoresis if  you have any of these reactions and report to your therapist. . Remove the Patch or electrodes if you have any undue sensation of pain or burning during the treatment and report discomfort to your therapist. . Tell your Therapist if you have had known adverse reactions to the application of electrical current. . If using the Patch, the LED light will turn off when treatment is complete and the patch can be removed.  Approximate treatment time is 1-3 hours.  Remove the patch when light goes off or after 6 hours. . The Patch can be worn during normal activity, however excessive motion where the electrodes have been placed can cause poor contact between the skin and the electrode or uneven electrical current resulting in greater risk of skin irritation. Marland Kitchen Keep out of the reach of children.   . DO NOT use if you have a cardiac pacemaker or any other electrically sensitive implanted device. . DO NOT use if you have a known sensitivity to dexamethasone. . DO NOT use during Magnetic Resonance Imaging (MRI). . DO NOT use over broken or compromised skin (e.g. sunburn, cuts, or acne) due to the increased risk of skin reaction. . DO NOT SHAVE over the area to be treated:  To establish good contact between the Patch and the skin, excessive hair may be clipped. . DO NOT place the Patch or electrodes on or over your eyes, directly over your heart, or brain. . DO NOT reuse the Patch or electrodes as this may cause burns to  occur.   [facility] [address], [address] [place], [place] [address] Phone # [phone] Fax [phone]

## 2017-04-22 NOTE — Therapy (Signed)
[facility] [address], [place], [place], [address] Phone: [phone]   Fax:  [phone]  Physical Therapy Treatment  Patient Details  Name: Leah Pierce MRN: [id number] Date of Birth: Dec 04, 1950 Referring Provider: Martinique, Betty, MD  Encounter Date: 04/22/2017      PT End of Session - 04/22/17 1229    Visit Number 6   PT Start Time 2202   PT Stop Time 1228   PT Time Calculation (min) 43 min   Activity Tolerance Patient tolerated treatment well   Behavior During Therapy [facility] for tasks assessed/performed      Past Medical History:  Diagnosis Date  . Allergy   . Asthma   . Chicken pox   . Migraines     Past Surgical History:  Procedure Laterality Date  . TONSILLECTOMY  1959    There were no vitals filed for this visit.      Subjective Assessment - 04/22/17 1204    Subjective Ready for D/C.  Pt reports 90% improvement   Currently in Pain? Yes   Pain Score 0-No pain            [facility] PT Assessment - 04/22/17 0001      Assessment   Medical Diagnosis Rt shoulder pain, impingement syndrome, shoulder, right     Cognition   Overall Cognitive Status Within Functional Limits for tasks assessed     Observation/Other Assessments   Focus on Therapeutic Outcomes (FOTO)  38% limitation     ROM / Strength   AROM / PROM / Strength Strength;AROM     AROM   Overall AROM  Deficits   AROM Assessment Site Shoulder   Right/Left Shoulder Right   Right Shoulder Flexion 132 Degrees   Right Shoulder ABduction 110 Degrees   Right Shoulder Internal Rotation --  lacking 9# and lateral   Right Shoulder External Rotation --  Rt=Lt     Strength   Right Shoulder Flexion 4/5   Right Shoulder ABduction 4-/5   Right Shoulder Internal Rotation 5/5   Right Shoulder External Rotation 4+/5                     [facility] Adult PT Treatment/Exercise - 04/22/17 0001      Shoulder Exercises: Seated    Flexion Strengthening;Both;20 reps  also scaption   Abduction AROM;Strengthening;Both;20 reps     Shoulder Exercises: Standing   Extension Strengthening;Both;20 reps;Theraband   Theraband Level (Shoulder Extension) Level 3 (Green)   Row Strengthening;Both;20 reps;Theraband   Theraband Level (Shoulder Row) Level 3 (Green)     Shoulder Exercises: Pulleys   Flexion 3 minutes   ABduction Limitations 3 min  some pain     Shoulder Exercises: ROM/Strengthening   UBE (Upper Arm Bike) L1 3x3  postural focus   PT present to discuss progress   Other ROM/Strengthening Exercises Finger ladder reaching  x 10 times  #22     Iontophoresis   Type of Iontophoresis Dexamethasone   Location Rt anterior glenohumeral joint  Used alcohol swab for oily skin   Dose 47m  #6   Time 6 hour wear                PT Education - 04/22/17 1212    Education provided Yes   Education Details scapular strength, 3 way raises   Person(s) Educated Patient   Methods Explanation;Demonstration   Comprehension Verbalized understanding;Returned demonstration  PT Short Term Goals - 04/06/17 1228      PT SHORT TERM GOAL #1   Title be independent in initial HEP   Time 4   Period Weeks   Status Achieved     PT SHORT TERM GOAL #3   Title report a 30% reduction in Rt shoulder pain with lifting and reaching   Time 4   Period Weeks   Status On-going  pain about the same     PT SHORT TERM GOAL #4   Title report 25% improvement in quality and quantity of sleep   Time 4   Period Weeks   Status On-going     PT SHORT TERM GOAL #5   Title demonstrate and verbalize understanding of correct posture and report making corrections at home   Time 4   Period Weeks   Status Achieved           PT Long Term Goals - 04/22/17 1202      PT LONG TERM GOAL #1   Title be independent in advanced HEP   Status Achieved     PT LONG TERM GOAL #2   Title reduce FOTO to < or = to 33% limitation    Baseline 38% limitation   Status Achieved     PT LONG TERM GOAL #3   Title report a 75% reduction in Rt shoulder pain with reaching and lifitng   Baseline 90%   Status Achieved     PT LONG TERM GOAL #4   Title demonstrate Rt shoulder A/ROM IR to lacking < or = to 3 inches vs the Lt to improve self-care   Status Not Met     PT LONG TERM GOAL #5   Title report no sleep limitations due to Rt shoulder pain   Status Achieved               Plan - 04/22/17 1212    Clinical Impression Statement Pt is ready for D/C to HEP.  Pt reports 90% overall improvement since the start of care.  Pt issued HEP for advanced strength after D/C and pt will continue to work on flexibility due to limited A/ROM into flexion and IR.  Pt will follow-up with MD as needed.     PT Next Visit Plan D/C PT to HEP   Consulted and Agree with Plan of Care Patient      Patient will benefit from skilled therapeutic intervention in order to improve the following deficits and impairments:     Visit Diagnosis: Chronic right shoulder pain  Abnormal posture  Stiffness of right shoulder, not elsewhere classified     Problem List There are no active problems to display for this patient.    PHYSICAL THERAPY DISCHARGE SUMMARY  Visits from Start of Care: 6 Current functional level related to goals / functional outcomes: Pt reports 90% overall improvement. Pt will D/C to HEP   Remaining deficits: See above for most current status.   Education / Equipment: HEP Plan: Patient agrees to discharge.  Patient goals were partially met. Patient is being discharged due to being pleased with the current functional level.  ?????        Sigurd Sos, PT 04/22/17 12:32 PM  [facility] [address] [place], [place], [address] Phone: [phone]   Fax:  [phone]  Name: Kortny Lirette MRN: [id number] Date of Birth: June 26, 1951

## 2017-05-18 ENCOUNTER — Ambulatory Visit (AMBULATORY_SURGERY_CENTER): Payer: Self-pay | Admitting: *Deleted

## 2017-05-18 ENCOUNTER — Encounter: Payer: Self-pay | Admitting: Gastroenterology

## 2017-05-18 VITALS — Ht 63.0 in | Wt 166.0 lb

## 2017-05-18 DIAGNOSIS — Z1211 Encounter for screening for malignant neoplasm of colon: Secondary | ICD-10-CM

## 2017-05-18 MED ORDER — NA SULFATE-K SULFATE-MG SULF 17.5-3.13-1.6 GM/177ML PO SOLN: ORAL | 0 refills | Status: DC

## 2017-05-18 NOTE — Progress Notes (Signed)
No egg or soy allergy  No home oxygen used or hx of sleep apnea  No diet medications used  No anesthesia or intubation problems per pt  Registered in EMMI  Pt had colonoscopy in 2006 in [place]- not sure who did procedure or where.  States she had no polyps

## 2017-05-18 NOTE — Addendum Note (Signed)
Addended by: WESTBROOK, KRISTEN B on: 05/18/2017 11:04 AM   Modules accepted: Level of Service  

## 2017-06-01 ENCOUNTER — Ambulatory Visit (AMBULATORY_SURGERY_CENTER): Payer: PPO | Admitting: Gastroenterology

## 2017-06-01 ENCOUNTER — Encounter: Payer: Self-pay | Admitting: Gastroenterology

## 2017-06-01 VITALS — BP 142/87 | HR 75 | Temp 97.1°F | Resp 17 | Ht 63.0 in | Wt 166.0 lb

## 2017-06-01 DIAGNOSIS — Z1211 Encounter for screening for malignant neoplasm of colon: Secondary | ICD-10-CM

## 2017-06-01 DIAGNOSIS — Z1212 Encounter for screening for malignant neoplasm of rectum: Secondary | ICD-10-CM | POA: Diagnosis not present

## 2017-06-01 MED ORDER — SODIUM CHLORIDE 0.9 % IV SOLN: 500.0000 mL | INTRAVENOUS | Status: AC

## 2017-06-01 NOTE — Progress Notes (Signed)
Alert and oriented x3, pleased with MAC, report to RN Vinnie Level

## 2017-06-01 NOTE — Op Note (Signed)
[facility] Patient Name: Leah Pierce Procedure Date: 06/01/2017 8:53 AM MRN: [id number] Endoscopist: Napoleon Form , MD Age: 66 Referring MD:  Date of Birth: 1951/06/30 Gender: Female Account #: [id number] Procedure:                Colonoscopy Indications:              Screening for colorectal malignant neoplasm, Last                            colonoscopy: date unknown (unable to locate last                            colonoscopy report) Medicines:                Monitored Anesthesia Care Procedure:                Pre-Anesthesia Assessment:                           - Prior to the procedure, a History and Physical                            was performed, and patient medications and                            allergies were reviewed. The patient's tolerance of                            previous anesthesia was also reviewed. The risks                            and benefits of the procedure and the sedation                            options and risks were discussed with the patient.                            All questions were answered, and informed consent                            was obtained. Prior Anticoagulants: The patient has                            taken no previous anticoagulant or antiplatelet                            agents. ASA Grade Assessment: II - A patient with                            mild systemic disease. After reviewing the risks                            and benefits, the patient was deemed in  satisfactory condition to undergo the procedure.                           After obtaining informed consent, the colonoscope                            was passed under direct vision. Throughout the                            procedure, the patient's blood pressure, pulse, and                            oxygen saturations were monitored continuously. The                            Colonoscope was introduced through  the anus and                            advanced to the the terminal ileum, with                            identification of the appendiceal orifice and IC                            valve. The colonoscopy was performed without                            difficulty. The patient tolerated the procedure                            well. The quality of the bowel preparation was                            excellent. The terminal ileum, ileocecal valve,                            appendiceal orifice, and rectum were photographed. Scope In: 8:56:45 AM Scope Out: 9:14:58 AM Scope Withdrawal Time: 0 hours 13 minutes 26 seconds  Total Procedure Duration: 0 hours 18 minutes 13 seconds  Findings:                 The perianal and digital rectal examinations were                            normal.                           Non-bleeding internal hemorrhoids were found during                            retroflexion. The hemorrhoids were small.                           The exam was otherwise without abnormality. Complications:            No immediate complications.  Estimated Blood Loss:     Estimated blood loss: none. Impression:               - Non-bleeding internal hemorrhoids.                           - The examination was otherwise normal.                           - No specimens collected. Recommendation:           - Patient has a contact number available for                            emergencies. The signs and symptoms of potential                            delayed complications were discussed with the                            patient. Return to normal activities tomorrow.                            Written discharge instructions were provided to the                            patient.                           - Resume previous diet.                           - Continue present medications.                           - Repeat colonoscopy in 10 years for screening                             purposes. Napoleon Form, MD 06/01/2017 9:17:03 AM This report has been signed electronically.

## 2017-06-01 NOTE — Patient Instructions (Signed)
YOU HAD AN ENDOSCOPIC PROCEDURE TODAY AT THE [facility]:   Refer to the procedure report that was given to you for any specific questions about what was found during the examination.  If the procedure report does not answer your questions, please call your gastroenterologist to clarify.  If you requested that your care partner not be given the details of your procedure findings, then the procedure report has been included in a sealed envelope for you to review at your convenience later.  YOU SHOULD EXPECT: Some feelings of bloating in the abdomen. Passage of more gas than usual.  Walking can help get rid of the air that was put into your GI tract during the procedure and reduce the bloating. If you had a lower endoscopy (such as a colonoscopy or flexible sigmoidoscopy) you may notice spotting of blood in your stool or on the toilet paper. If you underwent a bowel prep for your procedure, you may not have a normal bowel movement for a few days.  Please Note:  You might notice some irritation and congestion in your nose or some drainage.  This is from the oxygen used during your procedure.  There is no need for concern and it should clear up in a day or so.  SYMPTOMS TO REPORT IMMEDIATELY:   Following lower endoscopy (colonoscopy or flexible sigmoidoscopy):  Excessive amounts of blood in the stool  Significant tenderness or worsening of abdominal pains  Swelling of the abdomen that is new, acute  Fever of 100F or higher   For urgent or emergent issues, a gastroenterologist can be reached at any hour by calling [phone].   DIET:  We do recommend a small meal at first, but then you may proceed to your regular diet.  Drink plenty of fluids but you should avoid alcoholic beverages for 24 hours. Try to increase the fiber in your diet, and drink plenty of water.  ACTIVITY:  You should plan to take it easy for the rest of today and you should NOT DRIVE or use heavy machinery until  tomorrow (because of the sedation medicines used during the test).    FOLLOW UP: Our staff will call the number listed on your records the next business day following your procedure to check on you and address any questions or concerns that you may have regarding the information given to you following your procedure. If we do not reach you, we will leave a message.  However, if you are feeling well and you are not experiencing any problems, there is no need to return our call.  We will assume that you have returned to your regular daily activities without incident.  If any biopsies were taken you will be contacted by phone or by letter within the next 1-3 weeks.  Please call us at [phone] if you have not heard about the biopsies in 3 weeks.    SIGNATURES/CONFIDENTIALITY: You and/or your care partner have signed paperwork which will be entered into your electronic medical record.  These signatures attest to the fact that that the information above on your After Visit Summary has been reviewed and is understood.  Full responsibility of the confidentiality of this discharge information lies with you and/or your care-partner.  You will need another colonoscopy in 10 years. 

## 2017-06-01 NOTE — Progress Notes (Signed)
Pt's states no medical or surgical changes since previsit or office visit.  No egg or soy allergy  

## 2017-06-02 ENCOUNTER — Telehealth: Payer: Self-pay

## 2017-06-02 NOTE — Telephone Encounter (Signed)
  Follow up Call-  Call back number 06/01/2017  Post procedure Call Back phone  # [phone]  Permission to leave phone message Yes  Some recent data might be hidden     Patient questions:  Do you have a fever, pain , or abdominal swelling? No. Pain Score  0 *  Have you tolerated food without any problems? Yes.    Have you been able to return to your normal activities? Yes.    Do you have any questions about your discharge instructions: Diet   No. Medications  No. Follow up visit  No.  Do you have questions or concerns about your Care? No.  Actions: * If pain score is 4 or above: No action needed, pain <4.

## 2017-09-23 ENCOUNTER — Encounter: Payer: Self-pay | Admitting: Family Medicine

## 2017-09-23 ENCOUNTER — Ambulatory Visit: Payer: PPO | Admitting: Family Medicine

## 2017-09-23 VITALS — BP 120/82 | HR 89 | Temp 98.3°F | Resp 12 | Ht 63.0 in | Wt 162.5 lb

## 2017-09-23 DIAGNOSIS — J309 Allergic rhinitis, unspecified: Secondary | ICD-10-CM | POA: Insufficient documentation

## 2017-09-23 DIAGNOSIS — J988 Other specified respiratory disorders: Secondary | ICD-10-CM

## 2017-09-23 DIAGNOSIS — J4520 Mild intermittent asthma, uncomplicated: Secondary | ICD-10-CM | POA: Insufficient documentation

## 2017-09-23 MED ORDER — ALBUTEROL SULFATE HFA 108 (90 BASE) MCG/ACT IN AERS: 2.0000 | INHALATION_SPRAY | Freq: Four times a day (QID) | RESPIRATORY_TRACT | 0 refills | Status: DC | PRN

## 2017-09-23 MED ORDER — AZITHROMYCIN 250 MG PO TABS: ORAL_TABLET | ORAL | 0 refills | Status: AC

## 2017-09-23 NOTE — Progress Notes (Signed)
ACUTE VISIT  HPI:  Chief Complaint  Patient presents with  . Cough  . Body sweats    [name] is a 66 y.o.female here today complaining of 2-3 weeks of respiratory symptoms. Initially she had sore throat, myalgias, and fatigue but these are resolved. She is still having a productive cough with yellowish/clear sputum and subjective fever with night sweats.  Cough  This is a new problem. The current episode started 1 to 4 weeks ago. The problem has been unchanged. The cough is productive of sputum. Associated symptoms include chills, a fever, postnasal drip, rhinorrhea and sweats. Pertinent negatives include no chest pain, ear congestion, ear pain, eye redness, headaches, heartburn, hemoptysis, myalgias, nasal congestion, rash, sore throat, shortness of breath, weight loss or wheezing. The symptoms are aggravated by exercise. She has tried OTC cough suppressant for the symptoms. The treatment provided mild relief. Her past medical history is significant for environmental allergies.   No alleviating factor identified. No Hx of recent travel. No sick contact. No known insect bite.  Hx of allergies: Allergic rhinitis, she has not on treatment currently.  Symptoms are exacerbated by sudden changes in temperature, she is not sure about elevating factors.  She does not like intranasal spray medications.  History of asthma, she has not an exacerbation a year.   Review of Systems  Constitutional: Positive for chills, fatigue and fever. Negative for activity change, appetite change and weight loss.  HENT: Positive for postnasal drip, rhinorrhea and sneezing. Negative for ear pain, mouth sores, nosebleeds, sinus pain, sore throat, trouble swallowing and voice change.   Eyes: Negative for discharge, redness and itching.  Respiratory: Positive for cough. Negative for hemoptysis, shortness of breath and wheezing.   Cardiovascular: Negative for chest pain and leg swelling.    Gastrointestinal: Negative for abdominal pain, diarrhea, heartburn, nausea and vomiting.  Musculoskeletal: Negative for back pain and myalgias.  Skin: Negative for pallor and rash.  Allergic/Immunologic: Positive for environmental allergies.  Neurological: Negative for syncope, weakness and headaches.  Hematological: Negative for adenopathy. Does not bruise/bleed easily.      Current Outpatient Medications on File Prior to Visit  Medication Sig Dispense Refill  . BIOTIN PO Take by mouth as needed.    Marland Kitchen co-enzyme Q-10 30 MG capsule Take 30 mg by mouth daily.     . Multiple Vitamin (MULTIVITAMIN WITH MINERALS) TABS tablet Take 1 tablet by mouth daily.    Marland Kitchen thiamine (VITAMIN B-1) 100 MG tablet Take 100 mg by mouth daily.    . vitamin E 100 UNIT capsule Take by mouth daily.     Current Facility-Administered Medications on File Prior to Visit  Medication Dose Route Frequency Provider Last Rate Last Dose  . 0.9 %  sodium chloride infusion  500 mL Intravenous Continuous Nandigam, Eleonore Chiquito, MD         Past Medical History:  Diagnosis Date  . Allergy   . Arthritis   . Asthma    hx of  . Chicken pox   . Heart murmur   . Migraines    Allergies  Allergen Reactions  . Tetracyclines & Related     Rash on hands    Social History   Socioeconomic History  . Marital status: Single    Spouse name: None  . Number of children: None  . Years of education: None  . Highest education level: None  Social Needs  . Financial resource strain: None  .  Food insecurity - worry: None  . Food insecurity - inability: None  . Transportation needs - medical: None  . Transportation needs - non-medical: None  Occupational History  . None  Tobacco Use  . Smoking status: Never Smoker  . Smokeless tobacco: Never Used  Substance and Sexual Activity  . Alcohol use: Yes    Comment: occasionally  . Drug use: No  . Sexual activity: None  Other Topics Concern  . None  Social History Narrative   . None    Vitals:   09/23/17 1216  BP: 120/82  Pulse: 89  Resp: 12  Temp: 98.3 F (36.8 C)  SpO2: 98%   Body mass index is 28.79 kg/m.   Physical Exam  Nursing note and vitals reviewed. Constitutional: She is oriented to person, place, and time. She appears well-developed. She does not appear ill. No distress.  HENT:  Head: Normocephalic and atraumatic.  Right Ear: Tympanic membrane, external ear and ear canal normal.  Left Ear: Tympanic membrane, external ear and ear canal normal.  Nose: Right sinus exhibits no maxillary sinus tenderness and no frontal sinus tenderness. Left sinus exhibits no maxillary sinus tenderness and no frontal sinus tenderness.  Mouth/Throat: Oropharynx is clear and moist and mucous membranes are normal.  Hypertrophic turbinates.  Eyes: Conjunctivae are normal.  Neck: No muscular tenderness present. No edema and no erythema present.  Cardiovascular: Normal rate and regular rhythm.  No murmur heard. Respiratory: Effort normal and breath sounds normal. No respiratory distress.  Prolonged expiration.  Lymphadenopathy:       Head (right side): No submandibular adenopathy present.       Head (left side): No submandibular adenopathy present.    She has no cervical adenopathy.  Neurological: She is alert and oriented to person, place, and time. She has normal strength.  Skin: Skin is warm. No rash noted. No erythema.  Psychiatric: She has a normal mood and affect. Her speech is normal.  Well groomed, good eye contact.     ASSESSMENT AND PLAN:   [name] was seen today for cough and body sweats.  Diagnoses and all orders for this visit:  Respiratory tract infection  To the auscultation negative for rales. We discussed a few options, including imaging to evaluate for pneumonia versus empiric antibiotic treatment.  She would like to start antibiotic and prefers to hold on CXR.  We discussed side effects of abx.. Instructed about warning  signs. Recommend checking temperature if she feels like she is febrile. Explained that the cough can last a few days and even weeks after URI. Follow-up as needed.  -     azithromycin (ZITHROMAX) 250 MG tablet; 2 tabs day one then 1 tab daily for 4 days. -     albuterol (PROVENTIL HFA;VENTOLIN HFA) 108 (90 Base) MCG/ACT inhaler; Inhale 2 puffs every 6 (six) hours as needed into the lungs for wheezing or shortness of breath.  Allergic rhinitis, unspecified seasonality, unspecified trigger  Nasal irrigations with saline as needed. She is not interested in intranasal medication. OTC Allegra 180 mg daily recommended. Follow-up as needed.  Asthma, intermittent, uncomplicated  Explained the cough can be a manifestation of asthma exacerbation mainly when present with exercise, so Albuterol may help with cough. Albuterol inh 2 puff every 6 hours for a week then as needed for wheezing or shortness of breath.  Instructed about warning signs. Follow-up as needed.  -     albuterol (PROVENTIL HFA;VENTOLIN HFA) 108 (90 Base) MCG/ACT inhaler; Inhale 2  puffs every 6 (six) hours as needed into the lungs for wheezing or shortness of breath.    -Ms. Bryon Olden advised to seek attention immediately if symptoms worsen or to follow if they persist or new concerns arise.       Sidney Silberman G. [place], MD  [facility]. [place] office.

## 2017-09-23 NOTE — Patient Instructions (Signed)
A few things to remember from today's visit:   Respiratory tract infection - Plan: azithromycin (ZITHROMAX) 250 MG tablet, albuterol (PROVENTIL HFA;VENTOLIN HFA) 108 (90 Base) MCG/ACT inhaler  Allergic rhinitis, unspecified seasonality, unspecified trigger  There are 2 forms of allergic rhinitis: . Seasonal (hay fever): Caused by an allergy to pollen and/or mold spores in the air. Pollen is the fine powder that comes from the stamen of flowering plants. It can be carried through the air and is easily inhaled. Symptoms are seasonal and usually occur in spring, late summer, and fall. Marland Kitchen Perennial: Caused by other allergens such as dust mites, pet hair or dander, or mold. Symptoms occur year-round.  Symptoms: Your symptoms can vary, depending on the severity of your allergies. Symptoms can include: Sneezing, coughing.itching (mostly eyes, nose, mouth, throat and skin),runny nose,stuffy nose.headache,pressure in the nose and cheeks,ear fullness and popping, sore throat.watery, red, or swollen eyes,dark circles under your eyes,trouble smelling, and sometimes hives.  Allergic rhinitis cannot be prevented. You can help your symptoms by avoiding the things that you are allergic, including: . Keeping windows closed. This is especially important during high-pollen seasons. . Washing your hands after petting animals. . Using dust- and mite-proof bedding and mattress covers. . Wearing glasses outside to protect your eyes. . Showering before bed to wash off allergens from hair and skin. You can also avoid things that can make your symptoms worse, such as: . aerosol sprays . air pollution . cold temperatures . humidity . irritating fumes . tobacco smoke . wind . wood smoke.   Antihistamines help reduce the sneezing, runny nose, and itchiness of allergies. These come in pill form and as nasal sprays. Allegra,Zyrtec,or Claritin are some examples. Decongestants, such as pseudoephedrine and  phenylephrine, help temporarily relieve the stuffy nose of allergies. Decongestants are found in many medicines and come as pills, nose sprays, and nose drops. They could increase heart rate and cause tachycardia and tremor. Nasal Afrin should not be used for more than 3 days because you can become dependent on them. This causes you to feel even more stopped-up when you try to quit using them.  Nasal sprays: steroids or antihistaminics. Over the counter intranasal sterids: Nasocort,Rhinocort,or Flonase.You won't notice their benefits for up to 2 weeks after starting them. Allergy shots or sublingual tablets when other treatment do not help.This is done by immunologists.    Please be sure medication list is accurate. If a new problem present, please set up appointment sooner than planned today.

## 2017-10-21 ENCOUNTER — Other Ambulatory Visit: Payer: Self-pay | Admitting: Family Medicine

## 2017-10-21 DIAGNOSIS — J988 Other specified respiratory disorders: Secondary | ICD-10-CM

## 2017-10-21 DIAGNOSIS — J4520 Mild intermittent asthma, uncomplicated: Secondary | ICD-10-CM

## 2018-02-03 ENCOUNTER — Ambulatory Visit (INDEPENDENT_AMBULATORY_CARE_PROVIDER_SITE_OTHER): Payer: PPO | Admitting: Family Medicine

## 2018-02-03 ENCOUNTER — Encounter: Payer: Self-pay | Admitting: Family Medicine

## 2018-02-03 VITALS — BP 130/86 | HR 87 | Temp 98.2°F | Resp 12 | Ht 63.0 in | Wt 156.2 lb

## 2018-02-03 DIAGNOSIS — R51 Headache: Secondary | ICD-10-CM

## 2018-02-03 DIAGNOSIS — R519 Headache, unspecified: Secondary | ICD-10-CM

## 2018-02-03 DIAGNOSIS — J309 Allergic rhinitis, unspecified: Secondary | ICD-10-CM | POA: Diagnosis not present

## 2018-02-03 DIAGNOSIS — J3489 Other specified disorders of nose and nasal sinuses: Secondary | ICD-10-CM

## 2018-02-03 DIAGNOSIS — M542 Cervicalgia: Secondary | ICD-10-CM

## 2018-02-03 MED ORDER — AMOXICILLIN-POT CLAVULANATE 875-125 MG PO TABS: 1.0000 | ORAL_TABLET | Freq: Two times a day (BID) | ORAL | 0 refills | Status: AC

## 2018-02-03 MED ORDER — TIZANIDINE HCL 2 MG PO TABS: 2.0000 mg | ORAL_TABLET | Freq: Three times a day (TID) | ORAL | 0 refills | Status: DC | PRN

## 2018-02-03 MED ORDER — FLUTICASONE PROPIONATE 50 MCG/ACT NA SUSP: 1.0000 | Freq: Two times a day (BID) | NASAL | 3 refills | Status: DC

## 2018-02-03 NOTE — Patient Instructions (Addendum)
A few things to remember from today's visit:   Allergic rhinitis, unspecified seasonality, unspecified trigger - Plan: fluticasone (FLONASE) 50 MCG/ACT nasal spray  Sinus pain - Plan: amoxicillin-clavulanate (AUGMENTIN) 875-125 MG tablet  Cervicalgia - Plan: tiZANidine (ZANAFLEX) 2 MG tablet  There are 2 forms of allergic rhinitis: . Seasonal (hay fever): Caused by an allergy to pollen and/or mold spores in the air. Pollen is the fine powder that comes from the stamen of flowering plants. It can be carried through the air and is easily inhaled. Symptoms are seasonal and usually occur in spring, late summer, and fall. Marland Kitchen Perennial: Caused by other allergens such as dust mites, pet hair or dander, or mold. Symptoms occur year-round.  Symptoms: Your symptoms can vary, depending on the severity of your allergies. Symptoms can include: Sneezing, coughing.itching (mostly eyes, nose, mouth, throat and skin),runny nose,stuffy nose.headache,pressure in the nose and cheeks,ear fullness and popping, sore throat.watery, red, or swollen eyes,dark circles under your eyes,trouble smelling, and sometimes hives.  Allergic rhinitis cannot be prevented. You can help your symptoms by avoiding the things that you are allergic, including: . Keeping windows closed. This is especially important during high-pollen seasons. . Washing your hands after petting animals. . Using dust- and mite-proof bedding and mattress covers. . Wearing glasses outside to protect your eyes. . Showering before bed to wash off allergens from hair and skin. You can also avoid things that can make your symptoms worse, such as: . aerosol sprays . air pollution . cold temperatures . humidity . irritating fumes . tobacco smoke . wind . wood smoke.   Antihistamines help reduce the sneezing, runny nose, and itchiness of allergies. These come in pill form and as nasal sprays. Allegra,Zyrtec,or Claritin are some examples. Decongestants,  such as pseudoephedrine and phenylephrine, help temporarily relieve the stuffy nose of allergies. Decongestants are found in many medicines and come as pills, nose sprays, and nose drops. They could increase heart rate and cause tachycardia and tremor. Nasal Afrin should not be used for more than 3 days because you can become dependent on them. This causes you to feel even more stopped-up when you try to quit using them.  Nasal sprays: steroids or antihistaminics. Over the counter intranasal sterids: Nasocort,Rhinocort,or Flonase.You won't notice their benefits for up to 2 weeks after starting them. Allergy shots or sublingual tablets when other treatment do not help.This is done by immunologists.   Please be sure medication list is accurate. If a new problem present, please set up appointment sooner than planned today.

## 2018-02-03 NOTE — Progress Notes (Signed)
ACUTE VISIT   HPI:  Chief Complaint  Patient presents with  . Headache    started 3 days, right-sided    Leah Pierce is a 67 y.o. female, who is here today complaining of 3 days of right-sided headache.  She has history of migraines, which she reports as improved at age 28. Right facial and periocular pressure pain,6-7/10 ,constant. She denies associated photophobia, nausea, vomiting, or focal deficit.  She has not identified exacerbating or alleviating factors.  She is reporting visual floaters, which she has had intermittently for about 2 years, she has followed with her eye care provider. Also having sand-like sensation in the right eye, no pruritus or discharge. She denies any recent eye trauma or foreign body.  She has had some nasal congestion and clear rhinorrhea, "little speckles of blood."  She has no noted fever, chills, body aches, fatigue. No sick contacts or recent travel. She has history of allergic rhinitis.  She has not try OTC medications.  Right sided neck pressure/dull pain, intermittently for the past week or so. No history of trauma. Pain is not radiated to upper extremities. She denies numbness or tingling. Pain is exacerbated by neck movement. Alleviated by rest.  Review of Systems  Constitutional: Negative for activity change, appetite change, fatigue and fever.  HENT: Positive for congestion, nosebleeds, postnasal drip, rhinorrhea and sinus pain. Negative for ear pain, mouth sores, sore throat, trouble swallowing and voice change.   Eyes: Negative for photophobia, discharge, redness, itching and visual disturbance.  Respiratory: Positive for cough. Negative for chest tightness, shortness of breath and wheezing.   Gastrointestinal: Negative for abdominal pain, diarrhea, nausea and vomiting.  Musculoskeletal: Positive for neck pain. Negative for gait problem.  Skin: Negative for rash.  Allergic/Immunologic: Positive for  environmental allergies.  Neurological: Positive for headaches. Negative for syncope, facial asymmetry, weakness and numbness.  Hematological: Negative for adenopathy. Does not bruise/bleed easily.      Current Outpatient Medications on File Prior to Visit  Medication Sig Dispense Refill  . BIOTIN PO Take by mouth as needed.    Marland Kitchen co-enzyme Q-10 30 MG capsule Take 30 mg by mouth daily.     . Multiple Vitamin (MULTIVITAMIN WITH MINERALS) TABS tablet Take 1 tablet by mouth daily.    Marland Kitchen thiamine (VITAMIN B-1) 100 MG tablet Take 100 mg by mouth daily.    . vitamin E 100 UNIT capsule Take by mouth daily.    Marland Kitchen albuterol (PROVENTIL HFA;VENTOLIN HFA) 108 (90 Base) MCG/ACT inhaler Inhale 2 puffs every 6 (six) hours as needed into the lungs for wheezing or shortness of breath. 1 Inhaler 0   Current Facility-Administered Medications on File Prior to Visit  Medication Dose Route Frequency Provider Last Rate Last Dose  . 0.9 %  sodium chloride infusion  500 mL Intravenous Continuous Nandigam, Eleonore Chiquito, MD         Past Medical History:  Diagnosis Date  . Allergy   . Arthritis   . Asthma    hx of  . Chicken pox   . Heart murmur   . Migraines    Allergies  Allergen Reactions  . Tetracyclines & Related     Rash on hands    Social History   Socioeconomic History  . Marital status: Single    Spouse name: Not on file  . Number of children: Not on file  . Years of education: Not on file  . Highest education level: Not on  file  Occupational History  . Not on file  Social Needs  . Financial resource strain: Not on file  . Food insecurity:    Worry: Not on file    Inability: Not on file  . Transportation needs:    Medical: Not on file    Non-medical: Not on file  Tobacco Use  . Smoking status: Never Smoker  . Smokeless tobacco: Never Used  Substance and Sexual Activity  . Alcohol use: Yes    Comment: occasionally  . Drug use: No  . Sexual activity: Not on file  Lifestyle  .  Physical activity:    Days per week: Not on file    Minutes per session: Not on file  . Stress: Not on file  Relationships  . Social connections:    Talks on phone: Not on file    Gets together: Not on file    Attends religious service: Not on file    Active member of club or organization: Not on file    Attends meetings of clubs or organizations: Not on file    Relationship status: Not on file  Other Topics Concern  . Not on file  Social History Narrative  . Not on file    Vitals:   02/03/18 1409  BP: 130/86  Pulse: 87  Resp: 12  Temp: 98.2 F (36.8 C)  SpO2: 99%   Body mass index is 27.68 kg/m.    Physical Exam  Nursing note and vitals reviewed. Constitutional: She is oriented to person, place, and time. She appears well-developed. She does not appear ill. No distress.  HENT:  Head: Normocephalic and atraumatic.  Right Ear: Tympanic membrane, external ear and ear canal normal.  Left Ear: Tympanic membrane, external ear and ear canal normal.  Nose: Rhinorrhea and septal deviation present. Right sinus exhibits maxillary sinus tenderness. Right sinus exhibits no frontal sinus tenderness. Left sinus exhibits no maxillary sinus tenderness and no frontal sinus tenderness.  Mouth/Throat: Oropharynx is clear and moist and mucous membranes are normal.  Hypertrophic turbinates.  Eyes: Pupils are equal, round, and reactive to light. Conjunctivae and EOM are normal.  Neck: No edema and no erythema present.  Cardiovascular: Normal rate and regular rhythm.  No murmur heard. Respiratory: Effort normal and breath sounds normal. No respiratory distress.  Musculoskeletal:       Cervical back: She exhibits tenderness. She exhibits no bony tenderness.       Back:  Mild tenderness upon palpation of paraspinal cervical muscles right trapezium, mild muscle spasm.   Lymphadenopathy:    She has no cervical adenopathy.  Neurological: She is alert and oriented to person, place, and  time. She has normal strength. No cranial nerve deficit. Gait normal.  Skin: Skin is warm. No rash noted. No erythema.  Psychiatric: She has a normal mood and affect. Her speech is normal.  Well groomed, good eye contact.      ASSESSMENT AND PLAN:  Leah Pierce was seen today for headache.  Diagnoses and all orders for this visit:  Headache, unspecified headache type  We discussed possible etiologies, including migraine headache, allergies, sinusitis. Neurologic examination today is negative. Abdomen pain imaging is needed at this time. Instructed about warning signs.  Allergic rhinitis, unspecified seasonality, unspecified trigger  Saline nasal irrigations as needed. Flonase intranasal spray recommended as well as a OTC antihistaminic.  -     fluticasone (FLONASE) 50 MCG/ACT nasal spray; Place 1 spray into both nostrils 2 (two) times daily.  Sinus  pain  On examination today right maxillary tenderness with percussion, so I will treat as acute sinusitis.  Explained that this could be viral, in which case antibiotic treatment will not help. We discussed some side effects of antibiotics.  -     amoxicillin-clavulanate (AUGMENTIN) 875-125 MG tablet; Take 1 tablet by mouth 2 (two) times daily for 7 days.  Cervicalgia  Local ice and massage might help. We discussed some side effects of muscle relaxants, recommend Zanaflex 2 mg 3 times daily as needed. Since there is not a history of trauma, I do not think Symbicort imaging is needed today.   -     tiZANidine (ZANAFLEX) 2 MG tablet; Take 1 tablet (2 mg total) by mouth every 8 (eight) hours as needed for up to 15 days for muscle spasms.       -LeahRena Hunke was advised to seek immediate medical attention if sudden worsening symptoms or to follow if they persist or if new concerns arise.  She voices understanding and agrees with plan.       Betty G. [place], MD  [facility]. [place]  office.

## 2018-02-05 ENCOUNTER — Ambulatory Visit: Payer: PPO

## 2018-02-07 ENCOUNTER — Encounter: Payer: Self-pay | Admitting: Family Medicine

## 2018-03-08 ENCOUNTER — Other Ambulatory Visit: Payer: Self-pay | Admitting: Family Medicine

## 2018-03-08 DIAGNOSIS — M542 Cervicalgia: Secondary | ICD-10-CM

## 2018-03-12 ENCOUNTER — Other Ambulatory Visit: Payer: Self-pay | Admitting: *Deleted

## 2018-03-12 DIAGNOSIS — M542 Cervicalgia: Secondary | ICD-10-CM

## 2018-03-12 MED ORDER — TIZANIDINE HCL 2 MG PO TABS: 2.0000 mg | ORAL_TABLET | Freq: Four times a day (QID) | ORAL | 1 refills | Status: DC | PRN

## 2018-04-05 ENCOUNTER — Other Ambulatory Visit: Payer: Self-pay | Admitting: Family Medicine

## 2018-04-05 DIAGNOSIS — J309 Allergic rhinitis, unspecified: Secondary | ICD-10-CM

## 2018-04-19 NOTE — Progress Notes (Signed)
HPI:   Leah Pierce is a 67 y.o. female, who is here today for her welcome to Medicare visit.  Last CPE: 03/2017.  Regular exercise 3 or more time per week: Not consistently. Following a healthy diet: Yes.   She lives with her female partner. Independent ADL's and IADL's.  She fell about 1 to 2 months ago, slip on cocunut oil at home, unharmed.   Functional Status Survey: Is the patient deaf or have difficulty hearing?: No Does the patient have difficulty seeing, even when wearing glasses/contacts?: No Does the patient have difficulty concentrating, remembering, or making decisions?: No Does the patient have difficulty walking or climbing stairs?: No Does the patient have difficulty dressing or bathing?: No Does the patient have difficulty doing errands alone such as visiting a doctor's office or shopping?: No  Fall Risk  04/20/2018  Falls in the past year? Yes  Injury with Fall? No  Risk for fall due to : Other (Comment)     Providers she sees regularly: N/A  She is supposed to follow with ophthalmologist, she has not found provider in town. Last eye exam done in [place] in 2017.   Depression screen [facility] 2/9 04/20/2018  Decreased Interest 0  Down, Depressed, Hopeless 0  PHQ - 2 Score 0    Mini-Cog - 04/20/18 0934    Normal clock drawing test?  yes    How many words correct?  3       Hearing Screening   125Hz  250Hz  500Hz  1000Hz  2000Hz  3000Hz  4000Hz  6000Hz  8000Hz   Right ear:   Pass Pass Pass  Pass    Left ear:   Pass Pass Pass  Pass      Visual Acuity Screening   Right eye Left eye Both eyes  Without correction: 20/20 20/25 20/20   With correction:       Chronic medical problems:   Hyperlipidemia:  Currently on nonpharmacologic treatment. Following a low fat diet: Yes.   Lab Results  Component Value Date   CHOL 226 (H) 03/23/2017   HDL 58.90 03/23/2017   LDLCALC 141 (H) 03/23/2017   TRIG 130.0 03/23/2017   CHOLHDL 4 03/23/2017     03/2017 glucose 110  No history of DM, hypertension, CAD, or tobacco use.  Pap smear: 2017 negative. Hx of abnormal pap smears: Denies    There is no immunization history on file for this patient. Zoster vaccine never. Tdap > 10 years.  Mammogram: 2017 Colonoscopy: 05/2017, 10 years follow-up was recommended. DEXA: Does not recall date, 3+ years.  Hep C screening:  Never.  She has no concerns today.   Review of Systems  Constitutional: Negative for appetite change, fatigue, fever and unexpected weight change.  HENT: Negative for dental problem, hearing loss, nosebleeds, sore throat, trouble swallowing and voice change.   Eyes: Negative for redness and visual disturbance.  Respiratory: Negative for cough, shortness of breath and wheezing.   Cardiovascular: Negative for chest pain and leg swelling.  Gastrointestinal: Negative for abdominal pain, blood in stool, nausea and vomiting.       No changes in bowel habits.  Endocrine: Negative for cold intolerance, heat intolerance, polydipsia, polyphagia and polyuria.  Genitourinary: Negative for decreased urine volume, dysuria, hematuria, vaginal bleeding and vaginal discharge.       No breast tenderness or nipple discharge.  Musculoskeletal: Positive for arthralgias. Negative for gait problem.  Skin: Negative for rash and wound.  Allergic/Immunologic: Positive for environmental allergies.  Neurological: Negative for  syncope, weakness and headaches.  Hematological: Negative for adenopathy. Does not bruise/bleed easily.  Psychiatric/Behavioral: Negative for confusion and sleep disturbance. The patient is not nervous/anxious.   All other systems reviewed and are negative.     Current Outpatient Medications on File Prior to Visit  Medication Sig Dispense Refill  . BIOTIN PO Take by mouth as needed.    . Multiple Vitamin (MULTIVITAMIN WITH MINERALS) TABS tablet Take 1 tablet by mouth daily.    Marland Kitchen thiamine (VITAMIN B-1) 100 MG  tablet Take 100 mg by mouth daily.    Marland Kitchen tiZANidine (ZANAFLEX) 2 MG tablet Take 1 tablet (2 mg total) by mouth every 6 (six) hours as needed for muscle spasms. 45 tablet 1  . vitamin E 100 UNIT capsule Take by mouth daily.     Current Facility-Administered Medications on File Prior to Visit  Medication Dose Route Frequency Provider Last Rate Last Dose  . 0.9 %  sodium chloride infusion  500 mL Intravenous Continuous Nandigam, Eleonore Chiquito, MD         Past Medical History:  Diagnosis Date  . Allergy   . Arthritis   . Asthma    hx of  . Chicken pox   . Heart murmur   . Migraines     Past Surgical History:  Procedure Laterality Date  . CESAREAN SECTION    . COLONOSCOPY    . TONSILLECTOMY  1959    Allergies  Allergen Reactions  . Tetracyclines & Related     Rash on hands    Family History  Problem Relation Age of Onset  . Arthritis Mother   . Stroke Mother   . Hypertension Mother   . Diabetes Mother   . Colon polyps Mother   . Arthritis Father   . Cancer Father        Prostate  . Cancer Brother        prostate  . Cancer Brother        Pancreas  . Colon cancer Neg Hx   . Esophageal cancer Neg Hx   . Rectal cancer Neg Hx   . Stomach cancer Neg Hx     Social History   Socioeconomic History  . Marital status: Single    Spouse name: Not on file  . Number of children: Not on file  . Years of education: Not on file  . Highest education level: Not on file  Occupational History  . Not on file  Social Needs  . Financial resource strain: Not on file  . Food insecurity:    Worry: Not on file    Inability: Not on file  . Transportation needs:    Medical: Not on file    Non-medical: Not on file  Tobacco Use  . Smoking status: Never Smoker  . Smokeless tobacco: Never Used  Substance and Sexual Activity  . Alcohol use: Yes    Comment: occasionally  . Drug use: No  . Sexual activity: Not on file  Lifestyle  . Physical activity:    Days per week: Not on file     Minutes per session: Not on file  . Stress: Not on file  Relationships  . Social connections:    Talks on phone: Not on file    Gets together: Not on file    Attends religious service: Not on file    Active member of club or organization: Not on file    Attends meetings of clubs or organizations: Not on file  Relationship status: Not on file  Other Topics Concern  . Not on file  Social History Narrative  . Not on file     Vitals:   04/20/18 0920  BP: 124/78  Pulse: 80  Resp: 12  Temp: 98 F (36.7 C)  SpO2: 100%   Body mass index is 26.42 kg/m.   Wt Readings from Last 3 Encounters:  04/20/18 149 lb 2 oz (67.6 kg)  02/03/18 156 lb 4 oz (70.9 kg)  09/23/17 162 lb 8 oz (73.7 kg)     Physical Exam  Nursing note and vitals reviewed. Constitutional: She is oriented to person, place, and time. She appears well-developed and well-nourished. No distress.  HENT:  Head: Normocephalic and atraumatic.  Right Ear: Hearing, tympanic membrane, external ear and ear canal normal.  Left Ear: Hearing, tympanic membrane, external ear and ear canal normal.  Mouth/Throat: Uvula is midline, oropharynx is clear and moist and mucous membranes are normal.  Eyes: Pupils are equal, round, and reactive to light. Conjunctivae and EOM are normal.  Cardiovascular: Normal rate and regular rhythm.  No murmur heard. Pulses:      Dorsalis pedis pulses are 2+ on the right side, and 2+ on the left side.  Respiratory: Effort normal and breath sounds normal. No respiratory distress.  GI: Soft. She exhibits no mass. There is no hepatomegaly. There is no tenderness.  Genitourinary: There is no rash, tenderness or lesion on the right labia. There is no rash, tenderness or lesion on the left labia. Uterus is not enlarged and not tender. Cervix exhibits no motion tenderness, no discharge and no friability. Right adnexum displays no mass, no tenderness and no fullness. Left adnexum displays no mass, no  tenderness and no fullness. No erythema, tenderness or bleeding in the vagina. No vaginal discharge found.  Genitourinary Comments: Breast:No masses,skin abnormalities,or nipple discharge.  Pap smear collected.  Musculoskeletal: She exhibits no edema or tenderness.  No major deformity or signs of synovitis appreciated.  Lymphadenopathy:    She has no cervical adenopathy.       Right: No inguinal and no supraclavicular adenopathy present.       Left: No inguinal and no supraclavicular adenopathy present.  Neurological: She is alert and oriented to person, place, and time. She has normal strength. No cranial nerve deficit. Gait normal.  Reflex Scores:      Bicep reflexes are 2+ on the right side and 2+ on the left side.      Patellar reflexes are 2+ on the right side and 2+ on the left side. Skin: Skin is warm. No rash noted. No erythema.  Psychiatric: She has a normal mood and affect. Cognition and memory are normal.  Well groomed, good eye contact.    ASSESSMENT AND PLAN:  Leah Pierce was here today annual physical examination.   Orders Placed This Encounter  Procedures  . DG Bone Density  . MM 3D SCREEN BREAST BILATERAL  . Lipid panel  . Hemoglobin A1c  . Basic metabolic panel  . Hepatitis C antibody    Lab Results  Component Value Date   CHOL 211 (H) 04/21/2018   HDL 58.80 04/21/2018   LDLCALC 134 (H) 04/21/2018   TRIG 91.0 04/21/2018   CHOLHDL 4 04/21/2018   Lab Results  Component Value Date   CREATININE 0.73 04/21/2018   BUN 12 04/21/2018   NA 143 04/21/2018   K 4.3 04/21/2018   CL 108 04/21/2018   CO2 26 04/21/2018  Lab Results  Component Value Date   HGBA1C 5.9 04/21/2018    Encounter for Medicare annual wellness exam  We discussed the importance of staying active, physically and mentally, as well as the benefits of a healthy/balance diet. Low impact exercise that involve stretching and strengthing are ideal. Vaccines: Prevnar 13 will be  given the day she comes for lab work,04/21/18. Rx for Shingrix given and she was instructed to inquire about Tdap vaccination with her insurance. We discussed preventive screening for the next 5-10 years, summery of recommendations given in AVS: Colon cancer screening: Colonoscopy due in 2028. Eye exam and glaucoma screening: She does not need a referral to see an ophthalmologist, she will establish with a provider in the area. Diabetes screening every 1 to 3 years.  Fall prevention discussed.  Advance directives and end of life she has power of attorney and living will. Her POA is ehr nephew: Rosalia Hammers [phone]).    Encounter for HCV screening test for high risk patient -     Hepatitis C antibody; Future  Breast cancer screening -     MM 3D SCREEN BREAST BILATERAL; Future  Hyperlipidemia, unspecified hyperlipidemia type  For now she will continue nonpharmacologic treatment. Further recommendations will be given according to lipid panel results as well as 10 years score CVD. Follow-up in 6 to 12 months depending of lab results.  The 10-year ASCVD risk score Denman George DC Montez Hageman., et al., 2013) is: 7.6%   Values used to calculate the score:     Age: 55 years     Sex: Female     Is Non-Hispanic African American: Yes     Diabetic: No     Tobacco smoker: No     Systolic Blood Pressure: 124 mmHg     Is BP treated: No     HDL Cholesterol: 58.8 mg/dL     Total Cholesterol: 211 mg/dL  -     Lipid panel; Future  Hyperglycemia  Primary prevention through a healthy lifestyle recommended. Further recommendation will be given according to A1c results.  -     Hemoglobin A1c; Future -     Basic metabolic panel; Future  Cervical cancer screening -     Cytology - PAP  Asymptomatic postmenopausal estrogen deficiency -     DG Bone Density; Future    Return in about 1 year (around 04/21/2019) for Medicare/F/U.       Dyke Weible G. [place], MD  [facility]. [place]  office.

## 2018-04-20 ENCOUNTER — Other Ambulatory Visit (HOSPITAL_COMMUNITY)
Admission: RE | Admit: 2018-04-20 | Discharge: 2018-04-20 | Disposition: A | Discharge status: Home / Self Care | Payer: PPO | Source: Ambulatory Visit | Attending: Family Medicine | Admitting: Family Medicine

## 2018-04-20 ENCOUNTER — Ambulatory Visit (INDEPENDENT_AMBULATORY_CARE_PROVIDER_SITE_OTHER): Payer: PPO | Admitting: Family Medicine

## 2018-04-20 ENCOUNTER — Encounter: Payer: Self-pay | Admitting: Family Medicine

## 2018-04-20 VITALS — BP 124/78 | HR 80 | Temp 98.0°F | Resp 12 | Ht 63.0 in | Wt 149.1 lb

## 2018-04-20 DIAGNOSIS — E785 Hyperlipidemia, unspecified: Secondary | ICD-10-CM | POA: Diagnosis not present

## 2018-04-20 DIAGNOSIS — R768 Other specified abnormal immunological findings in serum: Secondary | ICD-10-CM | POA: Diagnosis not present

## 2018-04-20 DIAGNOSIS — Z9189 Other specified personal risk factors, not elsewhere classified: Secondary | ICD-10-CM

## 2018-04-20 DIAGNOSIS — Z1231 Encounter for screening mammogram for malignant neoplasm of breast: Secondary | ICD-10-CM

## 2018-04-20 DIAGNOSIS — Z1239 Encounter for other screening for malignant neoplasm of breast: Secondary | ICD-10-CM

## 2018-04-20 DIAGNOSIS — Z0000 Encounter for general adult medical examination without abnormal findings: Secondary | ICD-10-CM

## 2018-04-20 DIAGNOSIS — Z124 Encounter for screening for malignant neoplasm of cervix: Secondary | ICD-10-CM

## 2018-04-20 DIAGNOSIS — R739 Hyperglycemia, unspecified: Secondary | ICD-10-CM

## 2018-04-20 DIAGNOSIS — Z23 Encounter for immunization: Secondary | ICD-10-CM | POA: Diagnosis not present

## 2018-04-20 DIAGNOSIS — Z780 Asymptomatic menopausal state: Secondary | ICD-10-CM

## 2018-04-20 DIAGNOSIS — Z1159 Encounter for screening for other viral diseases: Secondary | ICD-10-CM | POA: Diagnosis not present

## 2018-04-20 NOTE — Patient Instructions (Signed)
A few things to remember from today's visit:   Encounter for Medicare annual wellness exam  Encounter for HCV screening test for high risk patient - Plan: Hepatitis C antibody  Breast cancer screening - Plan: MM 3D SCREEN BREAST BILATERAL  Hyperlipidemia, unspecified hyperlipidemia type - Plan: Lipid panel  Hyperglycemia - Plan: Hemoglobin F7J, Basic metabolic panel  Cervical cancer screening - Plan: Cytology - PAP  Asymptomatic postmenopausal estrogen deficiency - Plan: DG Bone Density     A few tips:  -As we age balance is not as good as it was, so there is a higher risks for falls. Please remove small rugs and furniture that is "in your way" and could increase the risk of falls. Stretching exercises may help with fall prevention: Yoga and Tai Chi are some examples. Low impact exercise is better, so you are not very achy the next day.  -Sun screen and avoidance of direct sun light recommended. Caution with dehydration, if working outdoors be sure to drink enough fluids.  - Some medications are not safe as we age, increases the risk of side effects and can potentially interact with other medication you are also taken;  including some of over the counter medications. Be sure to let me know when you start a new medication even if it is a dietary/vitamin supplement.   -Healthy diet low in red meet/animal fat and sugar + regular physical activity is recommended.       Screening schedule for the next 5-10 years:  Colonoscopy  Glaucoma screening/eye exam every 1-2 years.  Mammogram for breast cancer screening annually.  Flu vaccine annually.  Diabetes and cholesterol screening   Fall prevention   Advance directives:  Please see a lawyer and/or go to this website to help you with advanced directives and designating a health care power of attorney so that your wishes will be followed should you become too ill to make your own medical  decisions.  [email or web address]       A few tips:  -As we age balance is not as good as it was, so there is a higher risks for falls. Please remove small rugs and furniture that is "in your way" and could increase the risk of falls. Stretching exercises may help with fall prevention: Yoga and Tai Chi are some examples. Low impact exercise is better, so you are not very achy the next day.  -Sun screen and avoidance of direct sun light recommended. Caution with dehydration, if working outdoors be sure to drink enough fluids.  - Some medications are not safe as we age, increases the risk of side effects and can potentially interact with other medication you are also taken;  including some of over the counter medications. Be sure to let me know when you start a new medication even if it is a dietary/vitamin supplement.   -Healthy diet low in red meet/animal fat and sugar + regular physical activity is recommended.       Please be sure medication list is accurate. If a new problem present, please set up appointment sooner than planned today.

## 2018-04-21 DIAGNOSIS — Z23 Encounter for immunization: Secondary | ICD-10-CM | POA: Diagnosis not present

## 2018-04-21 DIAGNOSIS — Z124 Encounter for screening for malignant neoplasm of cervix: Secondary | ICD-10-CM | POA: Diagnosis not present

## 2018-04-21 DIAGNOSIS — Z1231 Encounter for screening mammogram for malignant neoplasm of breast: Secondary | ICD-10-CM | POA: Diagnosis not present

## 2018-04-21 DIAGNOSIS — Z0000 Encounter for general adult medical examination without abnormal findings: Secondary | ICD-10-CM | POA: Diagnosis not present

## 2018-04-21 DIAGNOSIS — Z9189 Other specified personal risk factors, not elsewhere classified: Secondary | ICD-10-CM | POA: Diagnosis not present

## 2018-04-21 DIAGNOSIS — R768 Other specified abnormal immunological findings in serum: Secondary | ICD-10-CM | POA: Diagnosis not present

## 2018-04-21 DIAGNOSIS — E785 Hyperlipidemia, unspecified: Secondary | ICD-10-CM | POA: Diagnosis not present

## 2018-04-21 DIAGNOSIS — Z780 Asymptomatic menopausal state: Secondary | ICD-10-CM | POA: Diagnosis not present

## 2018-04-21 DIAGNOSIS — Z1159 Encounter for screening for other viral diseases: Secondary | ICD-10-CM | POA: Diagnosis not present

## 2018-04-21 DIAGNOSIS — R739 Hyperglycemia, unspecified: Secondary | ICD-10-CM | POA: Diagnosis not present

## 2018-04-21 LAB — LIPID PANEL
Cholesterol: 211 mg/dL — ABNORMAL HIGH (ref 0–200)
HDL: 58.8 mg/dL (ref >39.00)
LDL Cholesterol: 134 mg/dL — ABNORMAL HIGH (ref 0–99)
NonHDL: 151.75
Total CHOL/HDL Ratio: 4
Triglycerides: 91 mg/dL (ref 0.0–149.0)
VLDL: 18.2 mg/dL (ref 0.0–40.0)

## 2018-04-21 LAB — BASIC METABOLIC PANEL
BUN: 12 mg/dL (ref 6–23)
CO2: 26 mEq/L (ref 19–32)
Calcium: 9.4 mg/dL (ref 8.4–10.5)
Chloride: 108 mEq/L (ref 96–112)
Creatinine, Ser: 0.73 mg/dL (ref 0.40–1.20)
GFR: 102.63 mL/min (ref >60.00)
Glucose, Bld: 102 mg/dL — ABNORMAL HIGH (ref 70–99)
Potassium: 4.3 mEq/L (ref 3.5–5.1)
Sodium: 143 mEq/L (ref 135–145)

## 2018-04-21 LAB — HEMOGLOBIN A1C: Hgb A1c MFr Bld: 5.9 % (ref 4.6–6.5)

## 2018-04-21 MED ORDER — ZOSTER VAC RECOMB ADJUVANTED 50 MCG/0.5ML IM SUSR: INTRAMUSCULAR | 1 refills | Status: DC

## 2018-04-22 LAB — CYTOLOGY - PAP
Diagnosis: NEGATIVE
HPV: NOT DETECTED

## 2018-04-25 LAB — HCV RNA,QUANTITATIVE REAL TIME PCR
HCV Quantitative Log: <1.18 Log IU/mL
HCV RNA, PCR, QN: <15 IU/mL

## 2018-04-25 LAB — HEPATITIS C ANTIBODY
Hepatitis C Ab: REACTIVE — AB
SIGNAL TO CUT-OFF: 1.17 — ABNORMAL HIGH (ref <1.00)

## 2018-06-07 ENCOUNTER — Ambulatory Visit
Admission: RE | Admit: 2018-06-07 | Discharge: 2018-06-07 | Disposition: A | Discharge status: Home / Self Care | Payer: PPO | Source: Ambulatory Visit | Attending: Family Medicine | Admitting: Family Medicine

## 2018-06-07 DIAGNOSIS — Z1231 Encounter for screening mammogram for malignant neoplasm of breast: Secondary | ICD-10-CM | POA: Diagnosis not present

## 2018-06-07 DIAGNOSIS — Z1239 Encounter for other screening for malignant neoplasm of breast: Secondary | ICD-10-CM

## 2018-06-07 DIAGNOSIS — Z780 Asymptomatic menopausal state: Secondary | ICD-10-CM

## 2018-06-07 DIAGNOSIS — M810 Age-related osteoporosis without current pathological fracture: Secondary | ICD-10-CM | POA: Diagnosis not present

## 2018-06-10 DIAGNOSIS — M810 Age-related osteoporosis without current pathological fracture: Secondary | ICD-10-CM | POA: Insufficient documentation

## 2018-07-28 ENCOUNTER — Other Ambulatory Visit: Payer: Self-pay | Admitting: *Deleted

## 2018-07-28 ENCOUNTER — Telehealth: Payer: Self-pay | Admitting: Family Medicine

## 2018-07-28 MED ORDER — ALENDRONATE SODIUM 70 MG PO TABS: 70.0000 mg | ORAL_TABLET | ORAL | 11 refills | Status: DC

## 2018-07-28 NOTE — Telephone Encounter (Signed)
Copied from CRM [id number]. Topic: General - Other >> Jul 28, 2018 12:13 PM Gerrianne Scale wrote: Reason for CRM: pt would like for [place] assistant to give her a call back about starting a pill for her bone loss in her hip she states that they discussed this when pt was in the office

## 2018-07-28 NOTE — Telephone Encounter (Signed)
Spoke with patient and she stated that her insurance denied paying for her mammogram when she came in for her wellness visit and she is going to appeal it and need a letter stating that it was apart/necessary for her wellness exam. Patient also stated that she is ready to start the Fosamax for her bone loss.

## 2018-08-04 ENCOUNTER — Encounter: Payer: Self-pay | Admitting: *Deleted

## 2018-08-04 NOTE — Telephone Encounter (Signed)
Patient informed that letter is completed and ready for pick-up. Letter placed at front desk. Patient verbalized understanding.

## 2018-08-04 NOTE — Telephone Encounter (Signed)
It is Ok to provide letter stating that mammogram is necessary and recommended for breast cancer screening. She had her prior mammogram in 2017.  Thanks, BJ

## 2018-08-21 IMAGING — DX DG CERVICAL SPINE COMPLETE 4+V
5 series · 6 of 6 positions shown · non-contrast
Comparison: None in PACs

CLINICAL DATA: Right shoulder, neck, and upper arm injury 2 months
ago. Persistent shoulder pain which radiates into the neck.

EXAM:
CERVICAL SPINE - COMPLETE 4+ VIEW

[c-spine lat]
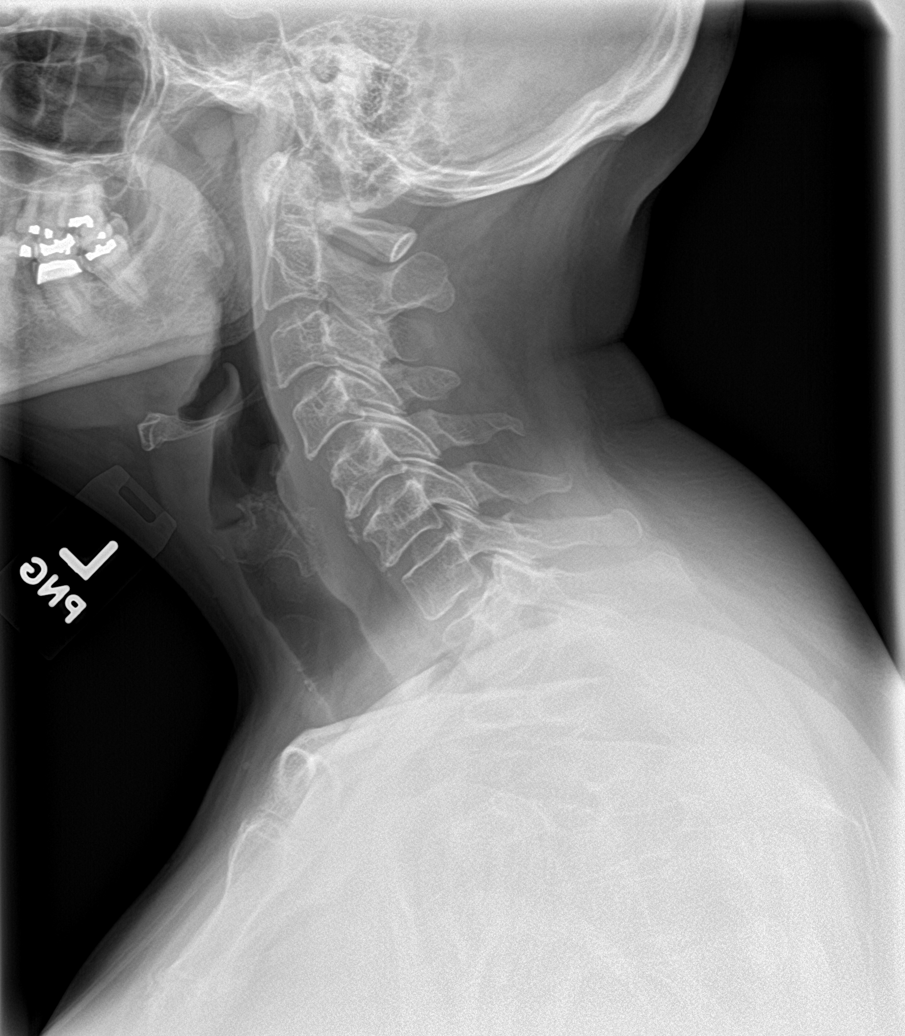

[c-spine obl (1 of 2)]
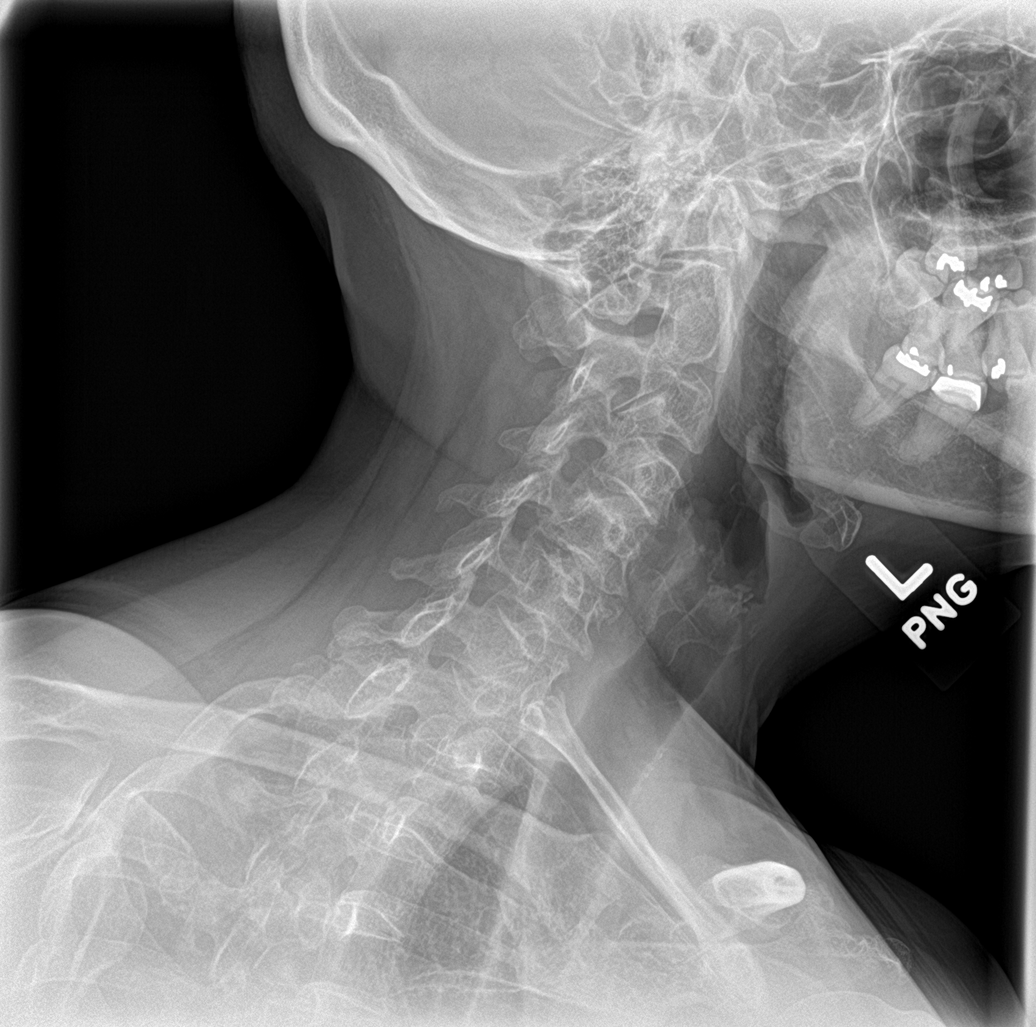

[c-spine obl (2 of 2)]
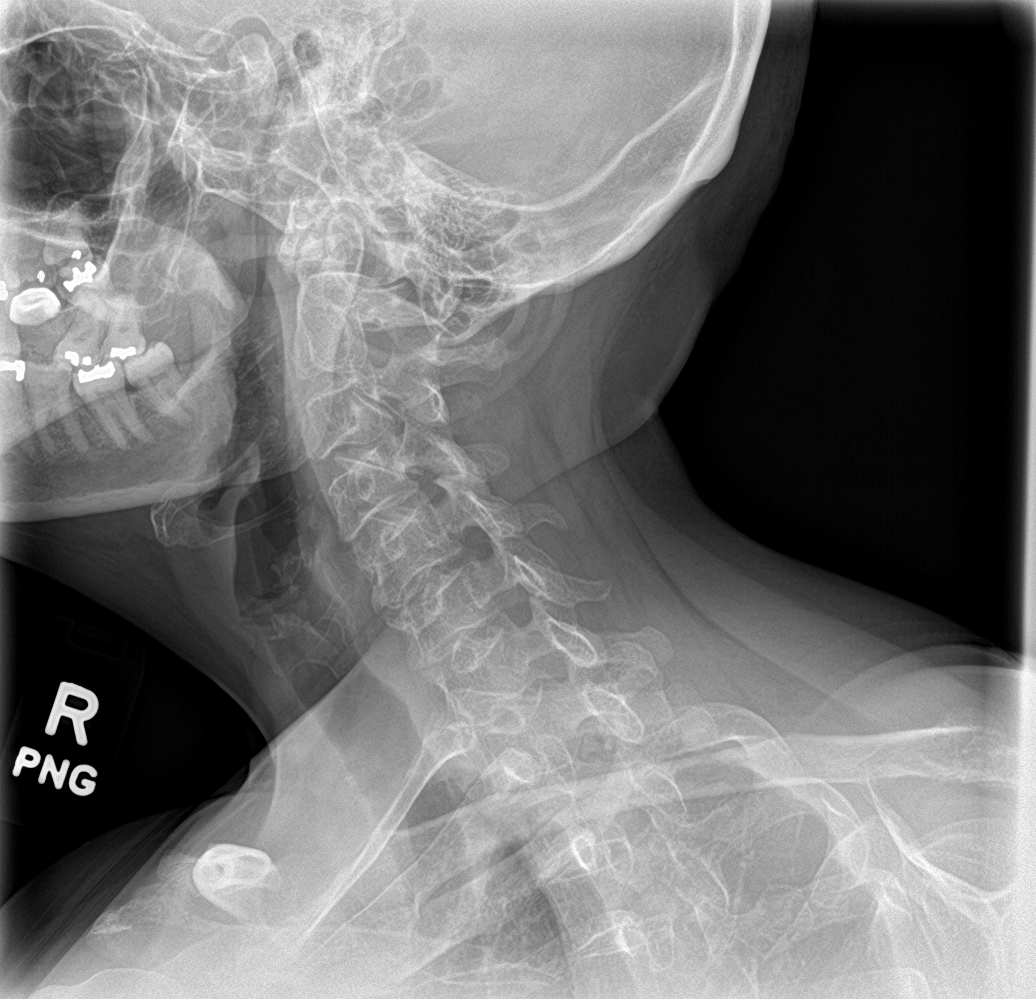

[c-spine ap]
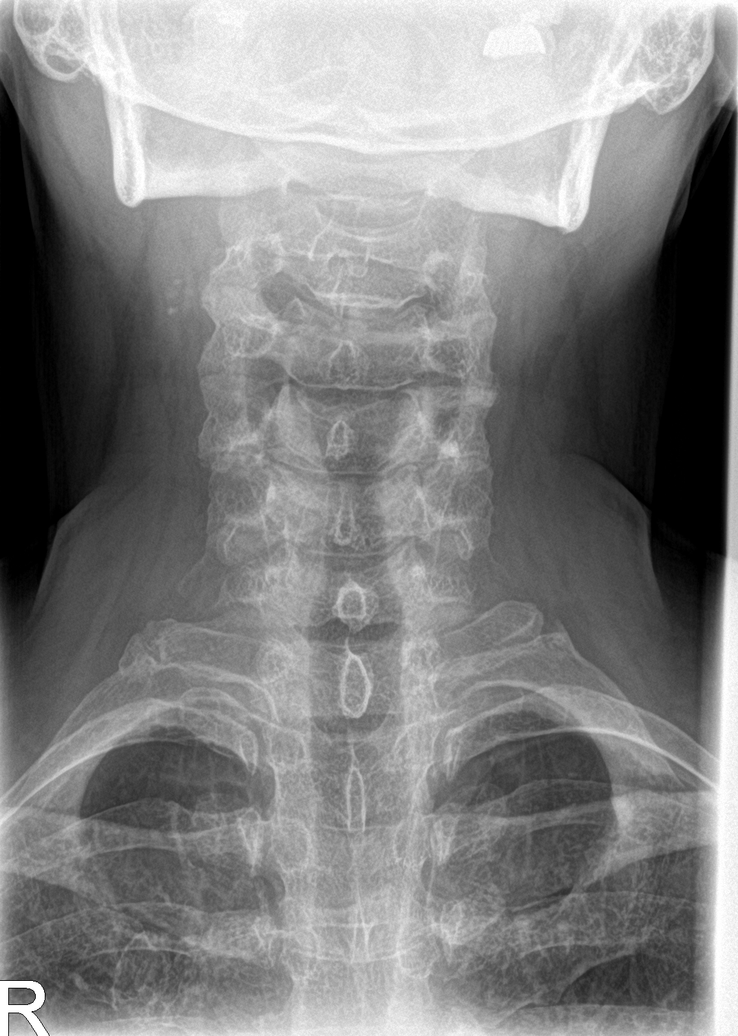

[Series 5: c-spine open mouth · 0.14mm/px · 2 of 2 slices shown]
[im 1/2]
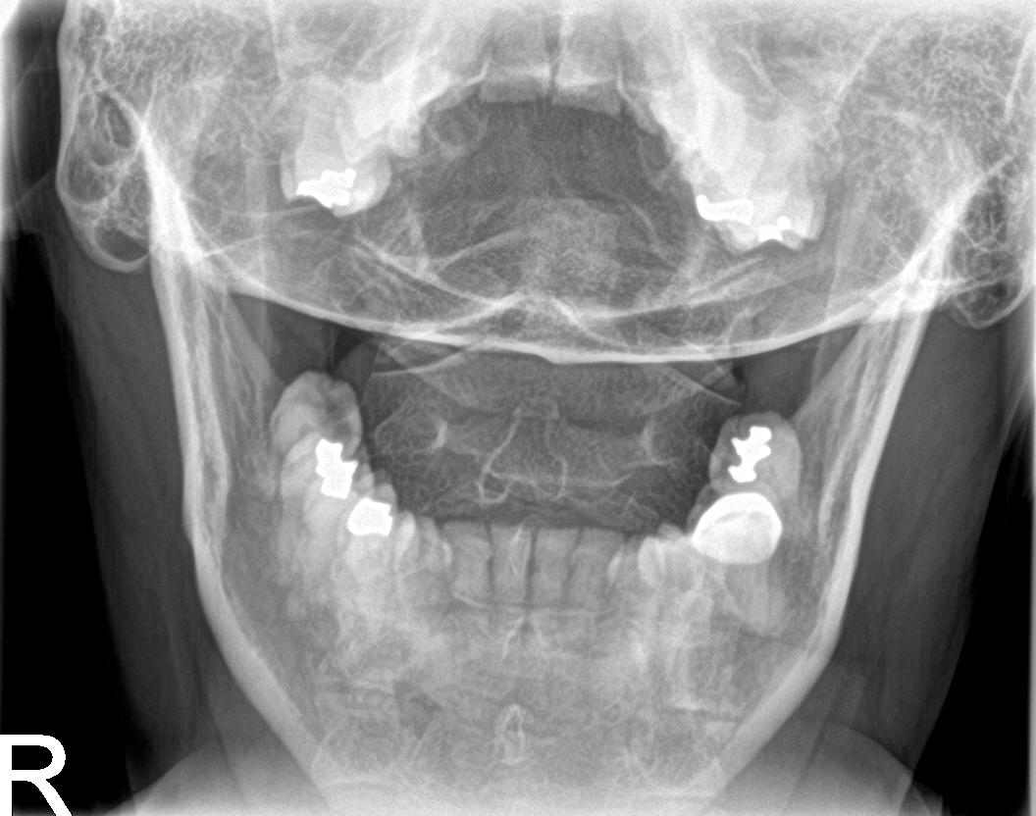
[im 2/2]
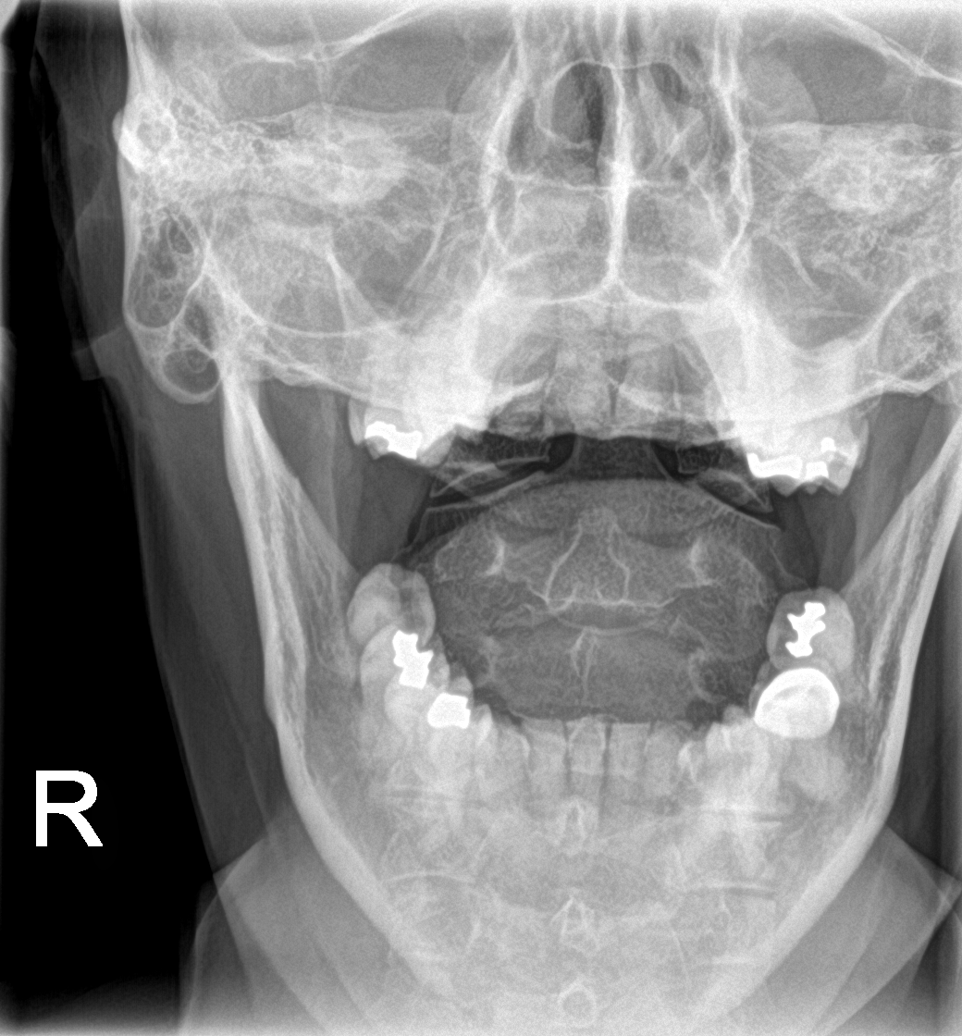

[6 of 6 positions shown; findings below may reference images not displayed]

FINDINGS: The cervical vertebral bodies are preserved in height. There is mild
disc space narrowing at C5-6. There is no perched facet or spinous
process fracture. The oblique views reveal no high-grade bony
encroachment upon the neural foramina. The spinous processes are
intact. The odontoid is intact. The prevertebral soft tissue spaces
are normal.
IMPRESSION: There is mild degenerative disc space narrowing at C5-6. There is no
compression fracture nor other acute bony abnormality. If the
patient's shoulder discomfort is felt to be radicular in nature,
cervical spine MRI may be useful.

## 2018-11-08 ENCOUNTER — Encounter: Payer: Self-pay | Admitting: Family Medicine

## 2018-11-08 ENCOUNTER — Ambulatory Visit (INDEPENDENT_AMBULATORY_CARE_PROVIDER_SITE_OTHER): Payer: PPO | Admitting: Family Medicine

## 2018-11-08 VITALS — BP 125/88 | HR 97 | Temp 99.0°F | Resp 12 | Ht 63.0 in | Wt 133.1 lb

## 2018-11-08 DIAGNOSIS — H6121 Impacted cerumen, right ear: Secondary | ICD-10-CM

## 2018-11-08 DIAGNOSIS — J029 Acute pharyngitis, unspecified: Secondary | ICD-10-CM | POA: Diagnosis not present

## 2018-11-08 DIAGNOSIS — H6692 Otitis media, unspecified, left ear: Secondary | ICD-10-CM | POA: Diagnosis not present

## 2018-11-08 LAB — POCT INFLUENZA A/B
Influenza A, POC: NEGATIVE
Influenza B, POC: NEGATIVE

## 2018-11-08 LAB — POCT RAPID STREP A (OFFICE): Rapid Strep A Screen: NEGATIVE

## 2018-11-08 MED ORDER — AMOXICILLIN-POT CLAVULANATE 875-125 MG PO TABS: 1.0000 | ORAL_TABLET | Freq: Two times a day (BID) | ORAL | 0 refills | Status: DC

## 2018-11-08 NOTE — Progress Notes (Signed)
ACUTE VISIT  HPI:  Chief Complaint  Patient presents with  . Left ear pain    started yesterday  . Headache    left-sided  . Nasal drainage    [name] is a 67 y.o.female here today complaining of left earache that started yesterday morning, sudden onset. For the past 2 days she has had some fatigue and chills, she has not noted fever. Yesterday she had left hemicranial headache, dull.  No associated visual changes, nausea, or vomiting.  She denies hearing loss but states that she feels like she is in a "bubble" bilateral ear fullness sensation.  Some rhinorrhea and postnasal drainage. Today mild sore throat.  She denies dysphasia or stridor.  Left earache has improved with local heat. 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  Otalgia   There is pain in the left ear. This is a new problem. The current episode started yesterday. The problem occurs every few minutes. The problem has been gradually improving. There has been no fever. The pain is moderate. Associated symptoms include coughing, headaches, neck pain, rhinorrhea and a sore throat. Pertinent negatives include no abdominal pain, diarrhea, ear discharge, rash or vomiting. She has tried acetaminophen for the symptoms. The treatment provided mild relief. There is no history of a chronic ear infection or hearing loss.   No Hx of recent travel. Grandson recently diagnosed with strep and conjunctivitis.  No known insect bite.  Hx of allergies: Yes, seasonal allergies.    OTC medications for this problem: Acetaminophen, honey will lemon.  Review of Systems  Constitutional: Positive for activity change, chills and fatigue. Negative for appetite change and fever.  HENT: Positive for ear pain, postnasal drip, rhinorrhea and sore throat. Negative for congestion, ear discharge, facial swelling, mouth sores, sinus pressure, trouble swallowing  and voice change.   Eyes: Negative for discharge and redness.  Respiratory: Positive for cough. Negative for shortness of breath and wheezing.   Gastrointestinal: Negative for abdominal pain, diarrhea, nausea and vomiting.  Musculoskeletal: Positive for neck pain. Negative for gait problem.  Skin: Negative for rash.  Allergic/Immunologic: Positive for environmental allergies.  Neurological: Positive for headaches. Negative for syncope and weakness.  Hematological: Negative for adenopathy. Does not bruise/bleed easily.    Current Outpatient Medications on File Prior to Visit  Medication Sig Dispense Refill  . alendronate (FOSAMAX) 70 MG tablet Take 1 tablet (70 mg total) by mouth every 7 (seven) days. Take with a full glass of water on an empty stomach. 4 tablet 11  . BIOTIN PO Take by mouth as needed.    . Multiple Vitamin (MULTIVITAMIN WITH MINERALS) TABS tablet Take 1 tablet by mouth daily.    Marland Kitchen thiamine (VITAMIN B-1) 100 MG tablet Take 100 mg by mouth daily.    Marland Kitchen tiZANidine (ZANAFLEX) 2 MG tablet Take 1 tablet (2 mg total) by mouth every 6 (six) hours as needed for muscle spasms. 45 tablet 1  . vitamin E 100 UNIT capsule Take by mouth daily.    Marland Kitchen Zoster Vaccine Adjuvanted [facility]) injection 0.5 ml in muscle and repeat in 8 weeks 0.5 mL 1   No current facility-administered medications on file prior to visit.      Past Medical History:  Diagnosis Date  . Allergy   . Arthritis   . Asthma    hx of  . Chicken pox   . Heart  murmur   . Migraines    Allergies  Allergen Reactions  . Tetracyclines & Related     Rash on hands    Social History   Socioeconomic History  . Marital status: Single    Spouse name: Not on file  . Number of children: Not on file  . Years of education: Not on file  . Highest education level: Not on file  Occupational History  . Not on file  Social Needs  . Financial resource strain: Not on file  . Food insecurity:    Worry: Not on file     Inability: Not on file  . Transportation needs:    Medical: Not on file    Non-medical: Not on file  Tobacco Use  . Smoking status: Never Smoker  . Smokeless tobacco: Never Used  Substance and Sexual Activity  . Alcohol use: Yes    Comment: occasionally  . Drug use: No  . Sexual activity: Not on file  Lifestyle  . Physical activity:    Days per week: Not on file    Minutes per session: Not on file  . Stress: Not on file  Relationships  . Social connections:    Talks on phone: Not on file    Gets together: Not on file    Attends religious service: Not on file    Active member of club or organization: Not on file    Attends meetings of clubs or organizations: Not on file    Relationship status: Not on file  Other Topics Concern  . Not on file  Social History Narrative  . Not on file    Vitals:   11/08/18 1116  BP: 125/88  Pulse: 97  Resp: 12  Temp: 99 F (37.2 C)  SpO2: 99%   Body mass index is 23.58 kg/m.   Physical Exam  Nursing note and vitals reviewed. Constitutional: She is oriented to person, place, and time. She appears well-developed and well-nourished. She does not appear ill. No distress.  HENT:  Head: Atraumatic.  Right Ear: External ear normal. No mastoid tenderness.  Left Ear: External ear and ear canal normal. No mastoid tenderness. Tympanic membrane is erythematous. Tympanic membrane is not bulging.  Nose: Rhinorrhea present. Right sinus exhibits no maxillary sinus tenderness and no frontal sinus tenderness. Left sinus exhibits no maxillary sinus tenderness and no frontal sinus tenderness.  Mouth/Throat: Uvula is midline and mucous membranes are normal. Posterior oropharyngeal erythema (mild) present. No oropharyngeal exudate or posterior oropharyngeal edema.  Right ear with cerumen excess, not able to see TM.  Eyes: Conjunctivae are normal.  Neck: No edema and no erythema present.  Cardiovascular: Normal rate and regular rhythm.  No murmur  heard. Respiratory: Effort normal and breath sounds normal. No stridor. No respiratory distress.  Lymphadenopathy:       Head (right side): Submandibular (Tender upon palpation, no enlarged.) adenopathy present.       Head (left side): No submandibular adenopathy present.    She has no cervical adenopathy.  Neurological: She is alert and oriented to person, place, and time. She has normal strength.  Skin: Skin is warm. No rash noted. No erythema.  Psychiatric: She has a normal mood and affect. Her speech is normal.  Well groomed, good eye contact.    ASSESSMENT AND PLAN:   Ms. Iliany was seen today for left ear pain, headache and nasal drainage.  Diagnoses and all orders for this visit:  Sore throat Possible etiology discussed, most likely  viral. Rapid strep and rapid flu test done today, both are negative. We will follow strep culture. For now recommend symptomatic treatment with throat lozenges, acetaminophen, and gargles with saline. Instructed about warning signs.  -     POC Influenza A/B -     POC Rapid Strep A  Left otitis media, unspecified otitis media type Symptoms have improved. Recommend continue monitoring for now, if earache has not resolved in 2 to 3 days she cannot start antibiotics or if symptoms get worse. She was clearly instructed about warning signs.  -     amoxicillin-clavulanate (AUGMENTIN) 875-125 MG tablet; Take 1 tablet by mouth 2 (two) times daily.  Impacted cerumen of right ear Recommend OTC Debrox in right ear. Avoid Q-tips.    Thanvi Blincoe G. [place], MD  [facility]. [place] office.

## 2018-11-08 NOTE — Patient Instructions (Addendum)
A few things to remember from today's visit:   Sore throat  Left otitis media, unspecified otitis media type - Plan: amoxicillin-clavulanate (AUGMENTIN) 875-125 MG tablet  Impacted cerumen of right ear  It infection could be viral, recommend waiting 2 to 3 days and if still having pain you can start antibiotics.  viral infections are self-limited and we treat each symptom depending of severity.  Over the counter medications as decongestants and cold medications usually help, they need to be taken with caution if there is a history of high blood pressure or palpitations. Tylenol and/or Ibuprofen also helps with most symptoms (headache, muscle aching, fever,etc) Plenty of fluids. Honey helps with cough. Monitor for new symptoms. . Please follow in not any better in 1-2 weeks or if symptoms get worse.   Please be sure medication list is accurate. If a new problem present, please set up appointment sooner than planned today.

## 2018-11-08 NOTE — Addendum Note (Signed)
Addended by: Jobe Gibbon on: 11/08/2018 04:22 PM   Modules accepted: Orders

## 2018-11-10 LAB — CULTURE, GROUP A STREP
MICRO NUMBER:: 91533497
SPECIMEN QUALITY:: ADEQUATE

## 2019-05-17 ENCOUNTER — Other Ambulatory Visit: Payer: Self-pay | Admitting: Family Medicine

## 2019-05-17 DIAGNOSIS — Z1231 Encounter for screening mammogram for malignant neoplasm of breast: Secondary | ICD-10-CM

## 2019-06-06 DIAGNOSIS — H40013 Open angle with borderline findings, low risk, bilateral: Secondary | ICD-10-CM | POA: Diagnosis not present

## 2019-06-06 DIAGNOSIS — H2513 Age-related nuclear cataract, bilateral: Secondary | ICD-10-CM | POA: Diagnosis not present

## 2019-06-20 DIAGNOSIS — H2513 Age-related nuclear cataract, bilateral: Secondary | ICD-10-CM | POA: Diagnosis not present

## 2019-06-20 DIAGNOSIS — H2511 Age-related nuclear cataract, right eye: Secondary | ICD-10-CM | POA: Diagnosis not present

## 2019-06-20 DIAGNOSIS — H40013 Open angle with borderline findings, low risk, bilateral: Secondary | ICD-10-CM | POA: Diagnosis not present

## 2019-06-22 ENCOUNTER — Telehealth: Payer: Self-pay | Admitting: *Deleted

## 2019-06-22 NOTE — Telephone Encounter (Signed)
Copied from [place] [phone]. Topic: Appointment Scheduling - Scheduling Inquiry for Clinic >> Jun 22, 2019  9:09 AM Pauline Good wrote: Reason for CRM: pt need to schedule an appt for congestion. Attempt to cal, office x 3 >> Jun 22, 2019  4:02 PM Reyne Dumas L wrote: Pt calling again to set up an appointment.  Pt believes she has a cold and has started to produce phlegm.

## 2019-06-22 NOTE — Telephone Encounter (Signed)
Patient scheduled virtual visit with Dr. Elease Hashimoto for 06/23/2019.

## 2019-06-23 ENCOUNTER — Telehealth (INDEPENDENT_AMBULATORY_CARE_PROVIDER_SITE_OTHER): Payer: Medicare Other | Admitting: Family Medicine

## 2019-06-23 ENCOUNTER — Other Ambulatory Visit: Payer: Self-pay

## 2019-06-23 DIAGNOSIS — R05 Cough: Secondary | ICD-10-CM

## 2019-06-23 DIAGNOSIS — R059 Cough, unspecified: Secondary | ICD-10-CM

## 2019-06-23 MED ORDER — AMOXICILLIN-POT CLAVULANATE 875-125 MG PO TABS: 1.0000 | ORAL_TABLET | Freq: Two times a day (BID) | ORAL | 0 refills | Status: DC

## 2019-06-23 NOTE — Progress Notes (Signed)
This visit type was conducted due to national recommendations for restrictions regarding the COVID-19 pandemic in an effort to limit this patient's exposure and mitigate transmission in our community.   Virtual Visit via Video Note  I connected with Leah Pierce on 06/23/19 at 10:30 AM EDT by a video enabled telemedicine application and verified that I am speaking with the correct person using two identifiers.  Location patient: home Location provider:work or home office Persons participating in the virtual visit: patient, provider  I discussed the limitations of evaluation and management by telemedicine and the availability of in person appointments. The patient expressed understanding and agreed to proceed.   HPI: Patient is seen with productive cough for past several days.  She started with some cough over the weekend last Saturday but now productive of yellow sputum.  She has history of mild intermittent asthma but rarely has flareups.  She states that she has a 63-year-old nephew that she was around last week who was diagnosed recently with a "cold ".  He is doing better at this time.  Patient is concerned because she has scheduled cataract surgery next Thursday.  She does have cough productive of thick yellow sputum.  Occasional night sweats.  No documented fever.  Denies any sore throat, nasal congestion, body aches, diarrhea, or any loss of taste or smell.  No other sick exposures.   ROS: See pertinent positives and negatives per HPI.  Past Medical History:  Diagnosis Date  . Allergy   . Arthritis   . Asthma    hx of  . Chicken pox   . Heart murmur   . Migraines     Past Surgical History:  Procedure Laterality Date  . CESAREAN SECTION    . COLONOSCOPY    . TONSILLECTOMY  1959    Family History  Problem Relation Age of Onset  . Arthritis Mother   . Stroke Mother   . Hypertension Mother   . Diabetes Mother   . Colon polyps Mother   . Arthritis Father   . Cancer  Father        Prostate  . Cancer Brother        prostate  . Cancer Brother        Pancreas  . Colon cancer Neg Hx   . Esophageal cancer Neg Hx   . Rectal cancer Neg Hx   . Stomach cancer Neg Hx     SOCIAL HX: Non-smoker   Current Outpatient Medications:  .  alendronate (FOSAMAX) 70 MG tablet, Take 1 tablet (70 mg total) by mouth every 7 (seven) days. Take with a full glass of water on an empty stomach., Disp: 4 tablet, Rfl: 11 .  amoxicillin-clavulanate (AUGMENTIN) 875-125 MG tablet, Take 1 tablet by mouth 2 (two) times daily., Disp: 14 tablet, Rfl: 0 .  BIOTIN PO, Take by mouth as needed., Disp: , Rfl:  .  Multiple Vitamin (MULTIVITAMIN WITH MINERALS) TABS tablet, Take 1 tablet by mouth daily., Disp: , Rfl:  .  thiamine (VITAMIN B-1) 100 MG tablet, Take 100 mg by mouth daily., Disp: , Rfl:  .  tiZANidine (ZANAFLEX) 2 MG tablet, Take 1 tablet (2 mg total) by mouth every 6 (six) hours as needed for muscle spasms., Disp: 45 tablet, Rfl: 1 .  vitamin E 100 UNIT capsule, Take by mouth daily., Disp: , Rfl:  .  Zoster Vaccine Adjuvanted [facility]) injection, 0.5 ml in muscle and repeat in 8 weeks, Disp: 0.5 mL, Rfl: 1  EXAM:  VITALS per patient if applicable:  GENERAL: alert, oriented, appears well and in no acute distress  HEENT: atraumatic, conjunttiva clear, no obvious abnormalities on inspection of external nose and ears  NECK: normal movements of the head and neck  LUNGS: on inspection no signs of respiratory distress, breathing rate appears normal, no obvious gross SOB, gasping or wheezing  CV: no obvious cyanosis  MS: moves all visible extremities without noticeable abnormality  PSYCH/NEURO: pleasant and cooperative, no obvious depression or anxiety, speech and thought processing grossly intact  ASSESSMENT AND PLAN:  Discussed the following assessment and plan:  Productive cough.  Patient in no respiratory distress.  She does have history of mild intermittent asthma  which is currently stable.  She does not feel that she is wheezing any currently and has no dyspnea with exertion or at rest.  Relatively low suspicion for COVID-19.  We discussed the following:  -Decided to go and start Augmentin 875 mg twice daily for 7 days given her underlying comorbidities and scheduled upcoming surgery -Follow-up immediately for any dyspnea or increasing fever -We discussed pros and cons of COVID-19 testing and at this point she wishes to wait which seems reasonable     I discussed the assessment and treatment plan with the patient. The patient was provided an opportunity to ask questions and all were answered. The patient agreed with the plan and demonstrated an understanding of the instructions.   The patient was advised to call back or seek an in-person evaluation if the symptoms worsen or if the condition fails to improve as anticipated.    Evelena Peat, MD

## 2019-06-24 ENCOUNTER — Telehealth: Payer: Self-pay | Admitting: Family Medicine

## 2019-06-24 NOTE — Telephone Encounter (Signed)
Patient returned call to clinic and stated that she was on her way to the pharmacy to pick up the medication.  Nothing further needed at this time.

## 2019-06-24 NOTE — Telephone Encounter (Signed)
Returned call to patient to see if medication was picked up from the pharmacy, no answer, left a message for patient to return call.

## 2019-06-24 NOTE — Telephone Encounter (Signed)
Patient calling again to check status. Requesting a call back when sent.

## 2019-06-24 NOTE — Telephone Encounter (Signed)
Pt following up on Rx sent by Dr Elease Hashimoto yesterday amoxicillin-clavulanate (AUGMENTIN) 875-125 MG tablet  Pt states it is not at the pharmacy.  Called pharmacy and confirmed they never received. Please resend to  [id number] [address], [place] [phone] (Phone) [phone] (Fax)

## 2019-06-24 NOTE — Telephone Encounter (Signed)
See request °

## 2019-06-30 ENCOUNTER — Other Ambulatory Visit: Payer: Self-pay | Admitting: Family Medicine

## 2019-06-30 DIAGNOSIS — H25011 Cortical age-related cataract, right eye: Secondary | ICD-10-CM | POA: Diagnosis not present

## 2019-06-30 DIAGNOSIS — R059 Cough, unspecified: Secondary | ICD-10-CM

## 2019-06-30 DIAGNOSIS — R05 Cough: Secondary | ICD-10-CM

## 2019-06-30 DIAGNOSIS — H2511 Age-related nuclear cataract, right eye: Secondary | ICD-10-CM | POA: Diagnosis not present

## 2019-07-01 ENCOUNTER — Ambulatory Visit
Admission: RE | Admit: 2019-07-01 | Discharge: 2019-07-01 | Disposition: A | Discharge status: Home / Self Care | Payer: Medicare Other | Source: Ambulatory Visit | Attending: Family Medicine | Admitting: Family Medicine

## 2019-07-01 ENCOUNTER — Other Ambulatory Visit: Payer: Self-pay

## 2019-07-01 DIAGNOSIS — Z1231 Encounter for screening mammogram for malignant neoplasm of breast: Secondary | ICD-10-CM | POA: Diagnosis not present

## 2019-07-01 DIAGNOSIS — H2512 Age-related nuclear cataract, left eye: Secondary | ICD-10-CM | POA: Diagnosis not present

## 2019-07-09 ENCOUNTER — Other Ambulatory Visit: Payer: Self-pay | Admitting: Family Medicine

## 2019-07-13 ENCOUNTER — Telehealth: Payer: Self-pay | Admitting: Family Medicine

## 2019-07-13 NOTE — Telephone Encounter (Signed)
Pt would like a refill for amoxicillin-clavulanate (AUGMENTIN) 875-125 MG tablet  So she can get rid of the cough and phlegm she still has/ please advise

## 2019-07-15 ENCOUNTER — Telehealth (INDEPENDENT_AMBULATORY_CARE_PROVIDER_SITE_OTHER): Payer: Medicare Other | Admitting: Family Medicine

## 2019-07-15 ENCOUNTER — Other Ambulatory Visit: Payer: Self-pay

## 2019-07-15 DIAGNOSIS — R05 Cough: Secondary | ICD-10-CM

## 2019-07-15 DIAGNOSIS — J4520 Mild intermittent asthma, uncomplicated: Secondary | ICD-10-CM

## 2019-07-15 DIAGNOSIS — R059 Cough, unspecified: Secondary | ICD-10-CM

## 2019-07-15 MED ORDER — BENZONATATE 100 MG PO CAPS: 200.0000 mg | ORAL_CAPSULE | Freq: Two times a day (BID) | ORAL | 0 refills | Status: AC | PRN

## 2019-07-15 NOTE — Telephone Encounter (Signed)
Please explain that Augmentin is an antibiotic, it is not usually refill and is prescribed just in case of bacterial infection. If she is having respiratory symptoms, she needs to be evaluated because she may not even need abx treatment.  Thanks, BJ

## 2019-07-15 NOTE — Telephone Encounter (Signed)
Please advise 

## 2019-07-15 NOTE — Telephone Encounter (Signed)
Patient notified of recommendations per Dr. Martinique. Patient scheduled virtual visit for this afternoon with PCP.

## 2019-07-15 NOTE — Progress Notes (Signed)
Virtual Visit via Video Note   I connected with Leah Pierce on 07/15/19 by a video enabled telemedicine application and verified that I am speaking with the correct person using two identifiers.  Location patient: home Location provider:work office Persons participating in the virtual visit: patient, provider  I discussed the limitations of evaluation and management by telemedicine and the availability of in person appointments. The patient expressed understanding and agreed to proceed.   HPI: Leah Pierce. Texeira is a 68 years old female with history of allergy rhinitis and asthma who is concerned about persistent cough. She was seen on 06/23/2019 for new onset of cough, she was prescribed Augmentin.  She completed antibiotic treatment but concerned because she is still coughing. At the time of her last visit she was having some chest tightness and feeling fatigued, all these have resolved.  She denies fever, chills, fatigue, body aches, headache,sore throat, chest pain, wheezing, or dyspnea.  Productive cough with clearish sputum. She denies hemoptysis. +Nasal congestion and rhinorrhea.  Negative for abdominal pain, nausea, vomiting, or changes in bowel habits.  She is not taking OTC medication. Her 2 years of grandnephew was sick with a URI a few days before she started with symptoms.  She denies recent travel or known contact with COVID-19 infected person.  ROS: See pertinent positives and negatives per HPI.  Past Medical History:  Diagnosis Date  . Allergy   . Arthritis   . Asthma    hx of  . Chicken pox   . Heart murmur   . Migraines     Past Surgical History:  Procedure Laterality Date  . CESAREAN SECTION    . COLONOSCOPY    . TONSILLECTOMY  1959    Family History  Problem Relation Age of Onset  . Arthritis Mother   . Stroke Mother   . Hypertension Mother   . Diabetes Mother   . Colon polyps Mother   . Arthritis Father   . Cancer Father        Prostate  . Cancer Brother         prostate  . Cancer Brother        Pancreas  . Colon cancer Neg Hx   . Esophageal cancer Neg Hx   . Rectal cancer Neg Hx   . Stomach cancer Neg Hx     Social History   Socioeconomic History  . Marital status: Single    Spouse name: Not on file  . Number of children: Not on file  . Years of education: Not on file  . Highest education level: Not on file  Occupational History  . Not on file  Social Needs  . Financial resource strain: Not on file  . Food insecurity    Worry: Not on file    Inability: Not on file  . Transportation needs    Medical: Not on file    Non-medical: Not on file  Tobacco Use  . Smoking status: Never Smoker  . Smokeless tobacco: Never Used  Substance and Sexual Activity  . Alcohol use: Yes    Comment: occasionally  . Drug use: No  . Sexual activity: Not on file  Lifestyle  . Physical activity    Days per week: Not on file    Minutes per session: Not on file  . Stress: Not on file  Relationships  . Social Herbalist on phone: Not on file    Gets together: Not on file    Attends religious  service: Not on file    Active member of club or organization: Not on file    Attends meetings of clubs or organizations: Not on file    Relationship status: Not on file  . Intimate partner violence    Fear of current or ex partner: Not on file    Emotionally abused: Not on file    Physically abused: Not on file    Forced sexual activity: Not on file  Other Topics Concern  . Not on file  Social History Narrative  . Not on file    Current Outpatient Medications:  .  alendronate (FOSAMAX) 70 MG tablet, TAKE 1 TABLET (70 MG TOTAL) BY MOUTH EVERY 7 (SEVEN) DAYS. TAKE WITH A FULL GLASS OF WATER ON AN EMPTY STOMACH., Disp: 12 tablet, Rfl: 3 .  amoxicillin-clavulanate (AUGMENTIN) 875-125 MG tablet, Take 1 tablet by mouth 2 (two) times daily., Disp: 14 tablet, Rfl: 0 .  benzonatate (TESSALON) 100 MG capsule, Take 2 capsules (200 mg total) by  mouth 2 (two) times daily as needed for up to 10 days., Disp: 40 capsule, Rfl: 0 .  BIOTIN PO, Take by mouth as needed., Disp: , Rfl:  .  Multiple Vitamin (MULTIVITAMIN WITH MINERALS) TABS tablet, Take 1 tablet by mouth daily., Disp: , Rfl:  .  thiamine (VITAMIN B-1) 100 MG tablet, Take 100 mg by mouth daily., Disp: , Rfl:  .  tiZANidine (ZANAFLEX) 2 MG tablet, Take 1 tablet (2 mg total) by mouth every 6 (six) hours as needed for muscle spasms., Disp: 45 tablet, Rfl: 1 .  vitamin E 100 UNIT capsule, Take by mouth daily., Disp: , Rfl:  .  Zoster Vaccine Adjuvanted [facility](SHINGRIX) injection, 0.5 ml in muscle and repeat in 8 weeks, Disp: 0.5 mL, Rfl: 1  EXAM:  VITALS per patient if applicable:She does not have a BP monitor.  GENERAL: alert, oriented, appears well and in no acute distress  HEENT: atraumatic, conjunttiva clear, no obvious abnormalities on inspection. Nasal congestion and blowing her nose a couple time during visit.  NECK: normal movements of the head and neck  LUNGS: on inspection no signs of respiratory distress, breathing rate appears normal, no obvious gross SOB, gasping or wheezing. Coughed a couple times during visit.  CV: no obvious cyanosis  PSYCH/NEURO: pleasant and cooperative, no obvious depression or anxiety, speech and thought processing grossly intact  ASSESSMENT AND PLAN:  Discussed the following assessment and plan:  Cough - Plan: benzonatate (TESSALON) 100 MG capsule Productive with clear sputum. Explained that after URI cough can persist for a few weeks. Other possible causes of cough were discussed, including post nasal drainage,allergies,and asthma. History does not suggest a serious process. Nasal irrigation with saline and OTC intranasal steroid may help. Recommend plain Mucinex and adequate hydration. Benzonatate 3 times daily as needed.  If new symptoms develop or if cough is still persistent in 2 to 3 weeks, we will plan on arranging chest  imaging.  Asthma, intermittent, uncomplicated Underlying bronchospasm could be contributing to cough. She is not reporting wheezing or dyspnea,still she may benefit from using albuterol inhaler. Recommend albuterol inhaler 2 puffs every 6 hours for 7 days. Instructed about warning signs.   I discussed the assessment and treatment plan with the patient.  She was provided an opportunity to ask questions and all were answered. She agreed with the plan and demonstrated an understanding of the instructions.   The patient was advised to call back or seek an in-person evaluation if  the symptoms worsen or if the condition fails to improve as anticipated.  Follow-up in 2-3 weeks for Medicare preventive visit, before if needed.     [place], MD

## 2019-07-21 DIAGNOSIS — H2512 Age-related nuclear cataract, left eye: Secondary | ICD-10-CM | POA: Diagnosis not present

## 2019-08-05 ENCOUNTER — Other Ambulatory Visit: Payer: Self-pay

## 2019-08-05 ENCOUNTER — Telehealth (INDEPENDENT_AMBULATORY_CARE_PROVIDER_SITE_OTHER): Payer: Medicare Other | Admitting: Family Medicine

## 2019-08-05 DIAGNOSIS — M816 Localized osteoporosis [Lequesne]: Secondary | ICD-10-CM | POA: Diagnosis not present

## 2019-08-05 DIAGNOSIS — Z0000 Encounter for general adult medical examination without abnormal findings: Secondary | ICD-10-CM | POA: Diagnosis not present

## 2019-08-05 DIAGNOSIS — E785 Hyperlipidemia, unspecified: Secondary | ICD-10-CM | POA: Diagnosis not present

## 2019-08-05 DIAGNOSIS — R739 Hyperglycemia, unspecified: Secondary | ICD-10-CM

## 2019-08-05 NOTE — Progress Notes (Signed)
Virtual Visit via Video Note   I connected with [name] on 08/05/19 by a video enabled telemedicine application and verified that I am speaking with the correct person using two identifiers.  Location patient: home Location provider:work office Persons participating in the virtual visit: patient, provider  I discussed the limitations of evaluation and management by telemedicine and the availability of in person appointments. The patient expressed understanding and agreed to proceed.   HPI: [name] is a 68 yo female with Hx of prediabetes and HLD who is being seen today for AWV and f/u.  She lives with her brother, who has many health issues, s/p CVA with aphasia.She is his caregiver. 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  She is not exercising regularly but follows a healthful diet. States that she does not have time to exercise, cannot go out for long because her brother cannot be alone for long. She has no help from other members of her family.  Independent ADL's and IADL's. She had a fall about 7 months ago, she was in his brother and tripped, she was able to get up and kept going. Denies depression symptoms.  Functional Status Survey: Is the patient deaf or have difficulty hearing?: No Does the patient have difficulty seeing, even when wearing glasses/contacts?: No Does the patient have difficulty concentrating, remembering, or making decisions?: No Does the patient have difficulty walking or climbing stairs?: No Does the patient have difficulty dressing or bathing?: No Does the patient have difficulty doing errands alone such as visiting a doctor's office or shopping?: No  Fall Risk  08/05/2019 04/20/2018  Falls in the past year? 1 Yes  Number falls in past yr: 0 -  Comment Tripped with scale 7 months ago, home. -  Injury with Fall? 0 No  Risk for fall due to : - Other (Comment)  Follow up Education provided -   Providers she sees regularly:  Eye  care provider: Ophthalmologist Dr Leah Pierce and optometrist Dr Leah Pierce  Depression screen [facility] 2/9 08/05/2019  Decreased Interest 0  Down, Depressed, Hopeless 0  PHQ - 2 Score 0    Mini-Cog - 08/05/19 1503    Normal clock drawing test?  yes    How many words correct?  3        Hearing Screening   125Hz  250Hz  500Hz  1000Hz  2000Hz  3000Hz  4000Hz  6000Hz  8000Hz   Right ear:           Left ear:           Vision Screening Comments: Virtual visit.  Hyperlipidemia: Currently on non pharmacologic treatment. Following low fat diet.  Lab Results  Component Value Date   CHOL 211 (H) 04/21/2018   HDL 58.80 04/21/2018   LDLCALC 134 (H) 04/21/2018   TRIG 91.0 04/21/2018   CHOLHDL 4 04/21/2018   Prediabetes: Denies abdominal pain, nausea,vomiting, polydipsia,polyuria, or polyphagia.  Lab Results  Component Value Date   HGBA1C 5.9 04/21/2018   Osteoporosis: On Fosamax 70 mg weekly. She has tolerated medication well,stared in 2019. Takes Ca++ and vit D supplementation.  ROS: See pertinent positives and negatives per HPI.  Past Medical History:  Diagnosis Date  . Allergy   . Arthritis   . Asthma    hx of  . Chicken pox   . Heart murmur   . Migraines     Past Surgical History:  Procedure Laterality Date  . CESAREAN SECTION    . COLONOSCOPY    .  TONSILLECTOMY  1959    Family History  Problem Relation Age of Onset  . Arthritis Mother   . Stroke Mother   . Hypertension Mother   . Diabetes Mother   . Colon polyps Mother   . Arthritis Father   . Cancer Father        Prostate  . Cancer Brother        prostate  . Cancer Brother        Pancreas  . Colon cancer Neg Hx   . Esophageal cancer Neg Hx   . Rectal cancer Neg Hx   . Stomach cancer Neg Hx     Social History   Socioeconomic History  . Marital status: Single    Spouse name: Not on file  . Number of children: Not on file  . Years of education: Not on file  . Highest education level: Not on file  Occupational  History  . Not on file  Social Needs  . Financial resource strain: Not on file  . Food insecurity    Worry: Not on file    Inability: Not on file  . Transportation needs    Medical: Not on file    Non-medical: Not on file  Tobacco Use  . Smoking status: Never Smoker  . Smokeless tobacco: Never Used  Substance and Sexual Activity  . Alcohol use: Yes    Comment: occasionally  . Drug use: No  . Sexual activity: Not on file  Lifestyle  . Physical activity    Days per week: Not on file    Minutes per session: Not on file  . Stress: Not on file  Relationships  . Social Musicianconnections    Talks on phone: Not on file    Gets together: Not on file    Attends religious service: Not on file    Active member of club or organization: Not on file    Attends meetings of clubs or organizations: Not on file    Relationship status: Not on file  . Intimate partner violence    Fear of current or ex partner: Not on file    Emotionally abused: Not on file    Physically abused: Not on file    Forced sexual activity: Not on file  Other Topics Concern  . Not on file  Social History Narrative  . Not on file    Current Outpatient Medications:  .  alendronate (FOSAMAX) 70 MG tablet, TAKE 1 TABLET (70 MG TOTAL) BY MOUTH EVERY 7 (SEVEN) DAYS. TAKE WITH A FULL GLASS OF WATER ON AN EMPTY STOMACH., Disp: 12 tablet, Rfl: 3 .  BIOTIN PO, Take by mouth as needed., Disp: , Rfl:  .  Multiple Vitamin (MULTIVITAMIN WITH MINERALS) TABS tablet, Take 1 tablet by mouth daily., Disp: , Rfl:  .  thiamine (VITAMIN B-1) 100 MG tablet, Take 100 mg by mouth daily., Disp: , Rfl:  .  vitamin E 100 UNIT capsule, Take by mouth daily., Disp: , Rfl:  .  Zoster Vaccine Adjuvanted [facility](SHINGRIX) injection, 0.5 ml in muscle and repeat in 8 weeks, Disp: 0.5 mL, Rfl: 1  EXAM:  VITALS per patient if applicable:She does not have a BP monitor.  GENERAL: alert, oriented, appears well and in no acute distress  HEENT: atraumatic,  conjunctiva clear, no obvious abnormalities on inspection of external nose and ears  NECK: normal movements of the head and neck  LUNGS: on inspection no signs of respiratory distress, breathing rate appears normal, no obvious  gross SOB, gasping or wheezing  CV: no obvious cyanosis  MS: moves all visible extremities without noticeable abnormality  PSYCH/NEURO: pleasant and cooperative, no obvious depression or anxiety, speech and thought processing grossly intact  ASSESSMENT AND PLAN:  Discussed the following assessment and plan:  Medicare annual wellness visit, subsequent We discussed the importance of staying active, physically and mentally, as well as the benefits of a healthy/balance diet. Low impact exercise that involve stretching and strengthing are ideal. Vaccines: She needs pneumovax,which can be given when she comes to the lab. We discussed preventive screening for the next 5-10 years, summery of recommendations discussed:  Colonoscopy: 05/2017 Mammogram: 07/01/19 [facility] 1. Fall prevention.   Health Maintenance  Topic Date Due  . Pneumonia vaccines (2 of 2 - PPSV23) 04/22/2019  . Mammogram  06/30/2021  . Colon Cancer Screening  06/02/2027  . DEXA scan (bone density measurement)  Completed  .  Hepatitis C: One time screening is recommended by Center for Disease Control  (CDC) for  adults born from [birth year] through 1964.   Completed  . Flu Shot  Discontinued  . Tetanus Vaccine  Discontinued     Advance directives and end of life she has power of attorney and living will. Her POA is ehr nephew: Rosalia Hammers [phone]).  Hyperglycemia - Plan: Hemoglobin A1c Healthy lifestyle for primary prevention.  Hyperlipidemia, unspecified hyperlipidemia type - Plan: Comprehensive metabolic panel, Lipid panel Continue low fat diet. Lab appt will be arranged and further recommendations will be given according to lab results.  Localized osteoporosis without current pathological  fracture Continue Fosamax 70 mg weekly. Ca++ and Vit D supplementation to continue. She will complete 5 years in 2024.   I discussed the assessment and treatment plan with the patient. She was provided an opportunity to ask questions and all were answered. She agreed with the plan and demonstrated an understanding of the instructions.    Return in about 6 months (around 02/02/2020) for cpe. Fasting labs: better for her this coming week from Monday through Thursday, fasting labs..     [place], MD

## 2019-08-10 ENCOUNTER — Other Ambulatory Visit (INDEPENDENT_AMBULATORY_CARE_PROVIDER_SITE_OTHER): Payer: Medicare Other

## 2019-08-10 ENCOUNTER — Other Ambulatory Visit: Payer: Self-pay

## 2019-08-10 DIAGNOSIS — R739 Hyperglycemia, unspecified: Secondary | ICD-10-CM

## 2019-08-10 DIAGNOSIS — E785 Hyperlipidemia, unspecified: Secondary | ICD-10-CM

## 2019-08-10 LAB — COMPREHENSIVE METABOLIC PANEL
ALT: 14 U/L (ref 0–35)
AST: 18 U/L (ref 0–37)
Albumin: 4.3 g/dL (ref 3.5–5.2)
Alkaline Phosphatase: 37 U/L — ABNORMAL LOW (ref 39–117)
BUN: 13 mg/dL (ref 6–23)
CO2: 26 mEq/L (ref 19–32)
Calcium: 9.6 mg/dL (ref 8.4–10.5)
Chloride: 101 mEq/L (ref 96–112)
Creatinine, Ser: 0.66 mg/dL (ref 0.40–1.20)
GFR: 108.04 mL/min (ref >60.00)
Glucose, Bld: 89 mg/dL (ref 70–99)
Potassium: 4 mEq/L (ref 3.5–5.1)
Sodium: 138 mEq/L (ref 135–145)
Total Bilirubin: 0.7 mg/dL (ref 0.2–1.2)
Total Protein: 7 g/dL (ref 6.0–8.3)

## 2019-08-10 LAB — LIPID PANEL
Cholesterol: 250 mg/dL — ABNORMAL HIGH (ref 0–200)
HDL: 76.2 mg/dL (ref >39.00)
LDL Cholesterol: 152 mg/dL — ABNORMAL HIGH (ref 0–99)
NonHDL: 173.89
Total CHOL/HDL Ratio: 3
Triglycerides: 111 mg/dL (ref 0.0–149.0)
VLDL: 22.2 mg/dL (ref 0.0–40.0)

## 2019-08-10 LAB — HEMOGLOBIN A1C: Hgb A1c MFr Bld: 5.9 % (ref 4.6–6.5)

## 2019-08-12 ENCOUNTER — Other Ambulatory Visit: Payer: Self-pay | Admitting: *Deleted

## 2019-08-12 DIAGNOSIS — E785 Hyperlipidemia, unspecified: Secondary | ICD-10-CM

## 2019-08-12 MED ORDER — ATORVASTATIN CALCIUM 20 MG PO TABS: 20.0000 mg | ORAL_TABLET | Freq: Every day | ORAL | 3 refills | Status: DC

## 2019-08-16 NOTE — Addendum Note (Signed)
Addended by: Martinique, Layla Kesling G on: 08/16/2019 03:14 PM   Modules accepted: Level of Service

## 2019-08-17 NOTE — Progress Notes (Signed)
The patient is scheduled for her physical 03/19

## 2019-10-04 DIAGNOSIS — H31012 Macula scars of posterior pole (postinflammatory) (post-traumatic), left eye: Secondary | ICD-10-CM | POA: Diagnosis not present

## 2019-10-10 ENCOUNTER — Ambulatory Visit (INDEPENDENT_AMBULATORY_CARE_PROVIDER_SITE_OTHER): Payer: Medicare Other

## 2019-10-10 ENCOUNTER — Encounter (HOSPITAL_COMMUNITY): Payer: Self-pay

## 2019-10-10 ENCOUNTER — Ambulatory Visit (HOSPITAL_COMMUNITY)
Admission: EM | Admit: 2019-10-10 | Discharge: 2019-10-10 | Disposition: A | Discharge status: Home / Self Care | Payer: Medicare Other | Attending: Family Medicine | Admitting: Family Medicine

## 2019-10-10 ENCOUNTER — Other Ambulatory Visit: Payer: Self-pay

## 2019-10-10 DIAGNOSIS — S52224A Nondisplaced transverse fracture of shaft of right ulna, initial encounter for closed fracture: Secondary | ICD-10-CM

## 2019-10-10 DIAGNOSIS — S52201A Unspecified fracture of shaft of right ulna, initial encounter for closed fracture: Secondary | ICD-10-CM | POA: Diagnosis not present

## 2019-10-10 MED ORDER — HYDROCODONE-ACETAMINOPHEN 5-325 MG PO TABS: 1.0000 | ORAL_TABLET | Freq: Four times a day (QID) | ORAL | 0 refills | Status: DC | PRN

## 2019-10-10 NOTE — ED Provider Notes (Signed)
[facility]    CSN: [id number] Arrival date & time: 10/10/19  1406      History   Chief Complaint Chief Complaint  Patient presents with  . Fall    HPI Leah Pierce is a 68 y.o. female.   HPI  Patient tripped and fell  down the steps at her home last night.  Landed on her right arm.  It is very painful.  She has been using ice.  The swelling is gone down, but the pain worsened .  She has pain from her mid upper arm/humerus to her wrist.  Limited movement of her elbow and wrist.  No numbness in the hand, good grip strength.  No prior fractures. Patient has osteoporosis based on bone density done 05/2018  Past Medical History:  Diagnosis Date  . Allergy   . Arthritis   . Asthma    hx of  . Chicken pox   . Heart murmur   . Migraines     Patient Active Problem List   Diagnosis Date Noted  . Allergic rhinitis 09/23/2017  . Asthma, intermittent, uncomplicated 09/23/2017    Past Surgical History:  Procedure Laterality Date  . CESAREAN SECTION    . COLONOSCOPY    . TONSILLECTOMY  1959    OB History   No obstetric history on file.      Home Medications    Prior to Admission medications   Medication Sig Start Date End Date Taking? Authorizing Provider  alendronate (FOSAMAX) 70 MG tablet TAKE 1 TABLET (70 MG TOTAL) BY MOUTH EVERY 7 (SEVEN) DAYS. TAKE WITH A FULL GLASS OF WATER ON AN EMPTY STOMACH. 07/11/19   [place], Betty G, MD  atorvastatin (LIPITOR) 20 MG tablet Take 1 tablet (20 mg total) by mouth daily. 08/12/19   [place], Betty G, MD  BIOTIN PO Take by mouth as needed.    [provider]  HYDROcodone-acetaminophen (NORCO/VICODIN) 5-325 MG tablet Take 1-2 tablets by mouth every 6 (six) hours as needed. 10/10/19   Eustace Moore, MD  Multiple Vitamin (MULTIVITAMIN WITH MINERALS) TABS tablet Take 1 tablet by mouth daily.    [provider]  thiamine (VITAMIN B-1) 100 MG tablet Take 100 mg by mouth daily.    [provider]  vitamin E 100 UNIT capsule Take by mouth daily.    [provider]  Zoster Vaccine Adjuvanted [facility]) injection 0.5 ml in muscle and repeat in 8 weeks 04/21/18   [place], Betty G, MD    Family History Family History  Problem Relation Age of Onset  . Arthritis Mother   . Stroke Mother   . Hypertension Mother   . Diabetes Mother   . Colon polyps Mother   . Arthritis Father   . Cancer Father        Prostate  . Cancer Brother        prostate  . Cancer Brother        Pancreas  . Colon cancer Neg Hx   . Esophageal cancer Neg Hx   . Rectal cancer Neg Hx   . Stomach cancer Neg Hx     Social History Social History   Tobacco Use  . Smoking status: Never Smoker  . Smokeless tobacco: Never Used  Substance Use Topics  . Alcohol use: Yes    Comment: occasionally  . Drug use: No     Allergies   Tetracyclines & related   Review of Systems Review of Systems  Constitutional:  Negative for chills and fever.  HENT: Negative for ear pain and sore throat.   Eyes: Negative for pain and visual disturbance.  Respiratory: Negative for cough and shortness of breath.   Cardiovascular: Negative for chest pain and palpitations.  Gastrointestinal: Negative for abdominal pain and vomiting.  Genitourinary: Negative for dysuria and hematuria.  Musculoskeletal: Positive for arthralgias. Negative for back pain.  Skin: Negative for color change and rash.  Neurological: Negative for seizures and syncope.  All other systems reviewed and are negative.    Physical Exam Triage Vital Signs ED Triage Vitals [10/10/19 1440]  Enc Vitals Group     BP (!) 143/91     Pulse Rate 74     Resp 17     Temp 98.4 F (36.9 C)     Temp Source Oral     SpO2 100 %     Weight      Height      Head Circumference      Peak Flow      Pain Score 8     Pain Loc      Pain Edu?      Excl. in GC?    No data found.  Updated Vital Signs BP (!) 143/91 (BP Location: Left Arm)    Pulse 74   Temp 98.4 F (36.9 C) (Oral)   Resp 17   SpO2 100%   Visual Acuity     Physical Exam Constitutional:      General: She is not in acute distress.    Appearance: She is well-developed.  HENT:     Head: Normocephalic and atraumatic.  Eyes:     Conjunctiva/sclera: Conjunctivae normal.     Pupils: Pupils are equal, round, and reactive to light.  Neck:     Musculoskeletal: Normal range of motion.  Cardiovascular:     Rate and Rhythm: Normal rate and regular rhythm.     Heart sounds: Normal heart sounds.  Pulmonary:     Effort: Pulmonary effort is normal. No respiratory distress.     Breath sounds: Normal breath sounds.  Abdominal:     General: There is no distension.     Palpations: Abdomen is soft.  Musculoskeletal: Normal range of motion.     Comments: Tender elbow and forearm diffusely with no visible swelling or discoloration, limited ROM Of elbow and wrist  Skin:    General: Skin is warm and dry.  Neurological:     General: No focal deficit present.     Mental Status: She is alert.  Psychiatric:        Mood and Affect: Mood normal.        Behavior: Behavior normal.      UC Treatments / Results  Labs (all labs ordered are listed, but only abnormal results are displayed) Labs Reviewed - No data to display  EKG   Radiology Dg Forearm Right  Result Date: 10/10/2019 CLINICAL DATA:  Acute right forearm pain after fall last night. EXAM: RIGHT FOREARM - 2 VIEW COMPARISON:  None. FINDINGS: Minimally displaced fracture is seen involving the midshaft of the right ulna. The radius is unremarkable. No soft tissue abnormality is noted. IMPRESSION: Minimally displaced midshaft fracture of right ulna. Electronically Signed   By: Lupita Raider M.D.   On: 10/10/2019 15:31    Procedures Procedures (including critical care time)  Medications Ordered in UC Medications - No data to display  Initial Impression / Assessment and Plan / UC Course  I  have reviewed  the triage vital signs and the nursing notes.  Pertinent labs & imaging results that were available during my care of the patient were reviewed by me and considered in my medical decision making (see chart for details).     I called Dr. Mardelle Matte.  He recommends a sugar tong and sling.  He will see patient later this week.  Diagnosis and treatment was discussed with patient. Final Clinical Impressions(s) / UC Diagnoses   Final diagnoses:  Closed nondisplaced transverse fracture of shaft of right ulna, initial encounter     Discharge Instructions     Use ice and elevation to reduce pain and swelling Leave splint on until you see the orthopedist Wear sling at all times when you are up Take Tylenol for pain.  If pain is severe take Tylenol with hydrocodone Call orthopedist today or tomorrow to set up an appointment for later this week    ED Prescriptions    Medication Sig Dispense Auth. Provider   HYDROcodone-acetaminophen (NORCO/VICODIN) 5-325 MG tablet Take 1-2 tablets by mouth every 6 (six) hours as needed. 10 tablet Raylene Everts, MD     I have reviewed the PDMP during this encounter.   Raylene Everts, MD 10/10/19 [phone]

## 2019-10-10 NOTE — Progress Notes (Signed)
Orthopedic Tech Progress Note Patient Details:  Leah Pierce 02-06-1951 [id number]  Ortho Devices Type of Ortho Device: Ace wrap, Arm sling, Sugartong splint Ortho Device/Splint Location: right Ortho Device/Splint Interventions: Application   Post Interventions Patient Tolerated: Well Instructions Provided: Care of device   [place] Pink 10/10/2019, 4:00 PM

## 2019-10-10 NOTE — Discharge Instructions (Signed)
Use ice and elevation to reduce pain and swelling Leave splint on until you see the orthopedist Wear sling at all times when you are up Take Tylenol for pain.  If pain is severe take Tylenol with hydrocodone Call orthopedist today or tomorrow to set up an appointment for later this week

## 2019-10-10 NOTE — ED Triage Notes (Signed)
Pt presents with right arm pain from humerus down to her wrist after a fall down a flight of stairs last night; pt has limited ROM and a lot of pain.

## 2019-10-11 ENCOUNTER — Ambulatory Visit: Payer: Medicare Other | Admitting: Family Medicine

## 2019-10-12 DIAGNOSIS — S52254A Nondisplaced comminuted fracture of shaft of ulna, right arm, initial encounter for closed fracture: Secondary | ICD-10-CM | POA: Diagnosis not present

## 2019-10-21 DIAGNOSIS — S52254D Nondisplaced comminuted fracture of shaft of ulna, right arm, subsequent encounter for closed fracture with routine healing: Secondary | ICD-10-CM | POA: Diagnosis not present

## 2019-11-04 DIAGNOSIS — S52254D Nondisplaced comminuted fracture of shaft of ulna, right arm, subsequent encounter for closed fracture with routine healing: Secondary | ICD-10-CM | POA: Diagnosis not present

## 2019-11-28 DIAGNOSIS — S52254D Nondisplaced comminuted fracture of shaft of ulna, right arm, subsequent encounter for closed fracture with routine healing: Secondary | ICD-10-CM | POA: Diagnosis not present

## 2019-12-08 ENCOUNTER — Ambulatory Visit: Payer: Medicare Other | Attending: Internal Medicine

## 2019-12-08 DIAGNOSIS — Z23 Encounter for immunization: Secondary | ICD-10-CM | POA: Insufficient documentation

## 2019-12-08 NOTE — Progress Notes (Signed)
   Covid-19 Vaccination Clinic  Name:  Sri Clegg    MRN: [id number] DOB: October 18, 1951  12/08/2019  Ms. Tokunaga was observed post Covid-19 immunization for 15 minutes without incidence. She was provided with Vaccine Information Sheet and instruction to access the V-Safe system.   Ms. Orellana was instructed to call 911 with any severe reactions post vaccine: Marland Kitchen Difficulty breathing  . Swelling of your face and throat  . A fast heartbeat  . A bad rash all over your body  . Dizziness and weakness    Immunizations Administered    Name Date Dose VIS Date Route   Pfizer COVID-19 Vaccine 12/08/2019  2:31 PM 0.3 mL 10/28/2019 Intramuscular   Manufacturer: [employer], [employer]   Lot: [id number]   NDC: [address]-1

## 2019-12-26 DIAGNOSIS — S52254D Nondisplaced comminuted fracture of shaft of ulna, right arm, subsequent encounter for closed fracture with routine healing: Secondary | ICD-10-CM | POA: Diagnosis not present

## 2019-12-29 ENCOUNTER — Ambulatory Visit: Payer: Medicare Other | Attending: Internal Medicine

## 2019-12-29 DIAGNOSIS — Z23 Encounter for immunization: Secondary | ICD-10-CM | POA: Insufficient documentation

## 2019-12-29 NOTE — Progress Notes (Signed)
   Covid-19 Vaccination Clinic  Name:  Ilaria Much    MRN: [id number] DOB: 27-Aug-1951  12/29/2019  Ms. Velazquez was observed post Covid-19 immunization for 15 minutes without incidence. She was provided with Vaccine Information Sheet and instruction to access the V-Safe system.   Ms. Merfeld was instructed to call 911 with any severe reactions post vaccine: Marland Kitchen Difficulty breathing  . Swelling of your face and throat  . A fast heartbeat  . A bad rash all over your body  . Dizziness and weakness    Immunizations Administered    Name Date Dose VIS Date Route   Pfizer COVID-19 Vaccine 12/29/2019  3:04 PM 0.3 mL 10/28/2019 Intramuscular   Manufacturer: [employer], [employer]   Lot: [id number]   NDC: [address]-4

## 2020-02-02 ENCOUNTER — Other Ambulatory Visit: Payer: Self-pay

## 2020-02-03 ENCOUNTER — Encounter: Payer: Self-pay | Admitting: Family Medicine

## 2020-02-03 ENCOUNTER — Ambulatory Visit (INDEPENDENT_AMBULATORY_CARE_PROVIDER_SITE_OTHER): Payer: Medicare Other | Admitting: Family Medicine

## 2020-02-03 VITALS — BP 140/80 | HR 85 | Temp 96.4°F | Resp 12 | Ht 63.0 in | Wt 131.0 lb

## 2020-02-03 DIAGNOSIS — E785 Hyperlipidemia, unspecified: Secondary | ICD-10-CM

## 2020-02-03 DIAGNOSIS — Z23 Encounter for immunization: Secondary | ICD-10-CM | POA: Diagnosis not present

## 2020-02-03 DIAGNOSIS — Z0000 Encounter for general adult medical examination without abnormal findings: Secondary | ICD-10-CM

## 2020-02-03 LAB — LIPID PANEL
Cholesterol: 164 mg/dL (ref 0–200)
HDL: 65.5 mg/dL (ref >39.00)
LDL Cholesterol: 80 mg/dL (ref 0–99)
NonHDL: 98.3
Total CHOL/HDL Ratio: 3
Triglycerides: 90 mg/dL (ref 0.0–149.0)
VLDL: 18 mg/dL (ref 0.0–40.0)

## 2020-02-03 LAB — COMPREHENSIVE METABOLIC PANEL
ALT: 10 U/L (ref 0–35)
AST: 15 U/L (ref 0–37)
Albumin: 3.8 g/dL (ref 3.5–5.2)
Alkaline Phosphatase: 38 U/L — ABNORMAL LOW (ref 39–117)
BUN: 14 mg/dL (ref 6–23)
CO2: 27 mEq/L (ref 19–32)
Calcium: 9.2 mg/dL (ref 8.4–10.5)
Chloride: 103 mEq/L (ref 96–112)
Creatinine, Ser: 0.63 mg/dL (ref 0.40–1.20)
GFR: 113.83 mL/min (ref >60.00)
Glucose, Bld: 86 mg/dL (ref 70–99)
Potassium: 4.1 mEq/L (ref 3.5–5.1)
Sodium: 138 mEq/L (ref 135–145)
Total Bilirubin: 0.6 mg/dL (ref 0.2–1.2)
Total Protein: 6.4 g/dL (ref 6.0–8.3)

## 2020-02-03 NOTE — Patient Instructions (Addendum)
A few things to remember from today's visit:   Routine general medical examination at a health care facility  Hyperlipidemia, unspecified hyperlipidemia type - Plan: Comprehensive metabolic panel, Lipid panel  No changes today. Be careful with falls. I will see you back for your medicare visit.  Mammogram due 10/2020. Colonoscopy in 05/2027. Pneumovax given today. Tdap can be given at your pharmacy.  A few tips:  -As we age balance is not as good as it was, so there is a higher risks for falls. Please remove small rugs and furniture that is "in your way" and could increase the risk of falls. Stretching exercises may help with fall prevention: Yoga and Tai Chi are some examples. Low impact exercise is better, so you are not very achy the next day.  -Sun screen and avoidance of direct sun light recommended. Caution with dehydration, if working outdoors be sure to drink enough fluids.  - Some medications are not safe as we age, increases the risk of side effects and can potentially interact with other medication you are also taken;  including some of over the counter medications. Be sure to let me know when you start a new medication even if it is a dietary/vitamin supplement.   -Healthy diet low in red meet/animal fat and sugar + regular physical activity is recommended.

## 2020-02-03 NOTE — Progress Notes (Signed)
HPI:   [name] is a 69 y.o. female, who is here today for her routine physical.  Last CPE: Over a year ago.  Regular exercise 3 or more time per week: Not consistently but she is active with chores around the house and baby sitting for her grand niece. Following a healthy diet: She tries to do so. She lives alone.  Chronic medical problems: Osteoporosis,HLD, GERD,and allergies among some.  Pap smear: 04/20/18 Hx of abnormal pap smears: Negative.  Immunization History  Administered Date(s) Administered  . PFIZER SARS-COV-2 Vaccination 12/08/2019, 12/29/2019  . Pneumococcal Conjugate-13 04/21/2018  . Pneumococcal Polysaccharide-23 02/03/2020   Mammogram: 10/27/19. Colonoscopy: 06/01/17, 10 years f/u was recommended. DEXA: 06/07/18. Osteoporosis,she is on Fosamax    Hep C screening: 04/21/18 reactive. HCV quantitative not detected.  She has no concerns today.  Review of Systems  Constitutional: Negative for appetite change, fatigue and fever.  HENT: Negative for dental problem, hearing loss, mouth sores and sore throat.   Eyes: Negative for redness and visual disturbance.  Respiratory: Negative for cough, shortness of breath and wheezing.   Cardiovascular: Negative for chest pain and leg swelling.  Gastrointestinal: Negative for abdominal pain, nausea and vomiting.       No changes in bowel habits.  Endocrine: Negative for cold intolerance, heat intolerance, polydipsia, polyphagia and polyuria.  Genitourinary: Negative for decreased urine volume, dysuria, hematuria, vaginal bleeding and vaginal discharge.  Musculoskeletal: Positive for arthralgias. Negative for gait problem and myalgias.  Skin: Negative for color change and rash.  Allergic/Immunologic: Positive for environmental allergies.  Neurological: Negative for syncope, weakness and headaches.  Hematological: Negative for adenopathy. Does not bruise/bleed easily.  Psychiatric/Behavioral: Negative  for confusion and sleep disturbance. The patient is not nervous/anxious.   All other systems reviewed and are negative.  Current Outpatient Medications on File Prior to Visit  Medication Sig Dispense Refill  . alendronate (FOSAMAX) 70 MG tablet TAKE 1 TABLET (70 MG TOTAL) BY MOUTH EVERY 7 (SEVEN) DAYS. TAKE WITH A FULL GLASS OF WATER ON AN EMPTY STOMACH. 12 tablet 3  . atorvastatin (LIPITOR) 20 MG tablet Take 1 tablet (20 mg total) by mouth daily. 90 tablet 3  . BIOTIN PO Take by mouth as needed.    . Multiple Vitamin (MULTIVITAMIN WITH MINERALS) TABS tablet Take 1 tablet by mouth daily.    Marland Kitchen thiamine (VITAMIN B-1) 100 MG tablet Take 100 mg by mouth daily.    . vitamin E 100 UNIT capsule Take by mouth daily.     No current facility-administered medications on file prior to visit.     Past Medical History:  Diagnosis Date  . Allergy   . Arthritis   . Asthma    hx of  . Chicken pox   . Heart murmur   . Migraines     Past Surgical History:  Procedure Laterality Date  . CESAREAN SECTION    . COLONOSCOPY    . TONSILLECTOMY  1959    Allergies  Allergen Reactions  . Tetracyclines & Related     Rash on hands    Family History  Problem Relation Age of Onset  . Arthritis Mother   . Stroke Mother   . Hypertension Mother   . Diabetes Mother   . Colon polyps Mother   . Arthritis Father   . Cancer Father        Prostate  . Cancer Brother        prostate  .  Cancer Brother        Pancreas  . Colon cancer Neg Hx   . Esophageal cancer Neg Hx   . Rectal cancer Neg Hx   . Stomach cancer Neg Hx     Social History   Socioeconomic History  . Marital status: Single    Spouse name: Not on file  . Number of children: Not on file  . Years of education: Not on file  . Highest education level: Not on file  Occupational History  . Not on file  Tobacco Use  . Smoking status: Never Smoker  . Smokeless tobacco: Never Used  Substance and Sexual Activity  . Alcohol use: Yes     Comment: occasionally  . Drug use: No  . Sexual activity: Not on file  Other Topics Concern  . Not on file  Social History Narrative  . Not on file   Social Determinants of Health   Financial Resource Strain:   . Difficulty of Paying Living Expenses:   Food Insecurity:   . Worried About Charity fundraiser in the Last Year:   . Arboriculturist in the Last Year:   Transportation Needs:   . Film/video editor (Medical):   Marland Kitchen Lack of Transportation (Non-Medical):   Physical Activity:   . Days of Exercise per Week:   . Minutes of Exercise per Session:   Stress:   . Feeling of Stress :   Social Connections:   . Frequency of Communication with Friends and Family:   . Frequency of Social Gatherings with Friends and Family:   . Attends Religious Services:   . Active Member of Clubs or Organizations:   . Attends Archivist Meetings:   Marland Kitchen Marital Status:     Vitals:   02/03/20 1023  BP: 140/80  Pulse: 85  Resp: 12  Temp: (!) 96.4 F (35.8 C)  SpO2: 99%   Body mass index is 23.21 kg/m.   Wt Readings from Last 3 Encounters:  02/03/20 131 lb (59.4 kg)  11/08/18 133 lb 2 oz (60.4 kg)  04/20/18 149 lb 2 oz (67.6 kg)   Physical Exam  Nursing note and vitals reviewed. Constitutional: She is oriented to person, place, and time. She appears well-developed and well-nourished. No distress.  HENT:  Head: Normocephalic and atraumatic.  Right Ear: Hearing, tympanic membrane, external ear and ear canal normal.  Left Ear: Hearing, tympanic membrane, external ear and ear canal normal.  Mouth/Throat: Uvula is midline, oropharynx is clear and moist and mucous membranes are normal.  Right ear canal cerumen,I am able to see TM.  Eyes: Pupils are equal, round, and reactive to light. Conjunctivae and EOM are normal.  Neck: No tracheal deviation present. No thyromegaly present.  Cardiovascular: Normal rate and regular rhythm.  No murmur heard. Pulses:      Posterior tibial  pulses are 2+ on the right side and 2+ on the left side.  Respiratory: Effort normal and breath sounds normal. No respiratory distress.  GI: Soft. She exhibits no mass. There is no hepatomegaly. There is no abdominal tenderness.  Genitourinary:    Genitourinary Comments: Refused breast exam.   Musculoskeletal:        General: No edema.     Comments: No signs of synovitis appreciated.  Lymphadenopathy:    She has no cervical adenopathy.       Right: No supraclavicular adenopathy present.       Left: No supraclavicular adenopathy present.  Neurological:  She is alert and oriented to person, place, and time. She has normal strength. No cranial nerve deficit. Gait normal.  Reflex Scores:      Bicep reflexes are 2+ on the right side and 2+ on the left side.      Patellar reflexes are 2+ on the right side and 2+ on the left side. Bicep and patellar DTR's symmetric.  Skin: Skin is warm. No rash noted. No erythema.  Psychiatric: She has a normal mood and affect.  Well groomed, good eye contact.   ASSESSMENT AND PLAN:  [name] was here today annual physical examination.  Orders Placed This Encounter  Procedures  . Pneumococcal polysaccharide vaccine 23-valent greater than or equal to 2yo subcutaneous/IM  . Comprehensive metabolic panel  . Lipid panel   Lab Results  Component Value Date   CHOL 164 02/03/2020   HDL 65.50 02/03/2020   LDLCALC 80 02/03/2020   TRIG 90.0 02/03/2020   CHOLHDL 3 02/03/2020   Lab Results  Component Value Date   ALT 10 02/03/2020   AST 15 02/03/2020   ALKPHOS 38 (L) 02/03/2020   BILITOT 0.6 02/03/2020   Lab Results  Component Value Date   CREATININE 0.63 02/03/2020   BUN 14 02/03/2020   NA 138 02/03/2020   K 4.1 02/03/2020   CL 103 02/03/2020   CO2 27 02/03/2020    Routine general medical examination at a health care facility We discussed the importance of regular physical activity and healthy diet for prevention of chronic  illness and/or complications. Preventive guidelines reviewed. Vaccination up to date.  Ca++ and vit D supplementation to continue. Next CPE in a year.  Hyperlipidemia, unspecified hyperlipidemia type Continue Atorvastatin 20 mg daily. Further recommendations according to lipid panel.  Need for 23-polyvalent pneumococcal polysaccharide vaccine -     Pneumococcal polysaccharide vaccine 23-valent greater than or equal to 2yo subcutaneous/IM   Return in 6 months (on 08/07/2020) for AWV and f/u.   Bomani Oommen G. Martin[place], MD  [facility] [place] office.     A few things to remember from today's visit:   Routine general medical examination at a health care facility  Hyperlipidemia, unspecified hyperlipidemia type - Plan: Comprehensive metabolic panel, Lipid panel  No changes today. Be careful with falls. I will see you back for your medicare visit.  Mammogram due 10/2020. Colonoscopy in 05/2027. Pneumovax given today. Tdap can be given at your pharmacy.  A few tips:  -As we age balance is not as good as it was, so there is a higher risks for falls. Please remove small rugs and furniture that is "in your way" and could increase the risk of falls. Stretching exercises may help with fall prevention: Yoga and Tai Chi are some examples. Low impact exercise is better, so you are not very achy the next day.  -Sun screen and avoidance of direct sun light recommended. Caution with dehydration, if working outdoors be sure to drink enough fluids.  - Some medications are not safe as we age, increases the risk of side effects and can potentially interact with other medication you are also taken;  including some of over the counter medications. Be sure to let me know when you start a new medication even if it is a dietary/vitamin supplement.   -Healthy diet low in red meet/animal fat and sugar + regular physical activity is recommended.

## 2020-04-25 ENCOUNTER — Other Ambulatory Visit: Payer: Self-pay

## 2020-04-26 ENCOUNTER — Telehealth: Payer: Medicare Other | Admitting: Family Medicine

## 2020-05-03 ENCOUNTER — Other Ambulatory Visit: Payer: Self-pay

## 2020-05-04 ENCOUNTER — Ambulatory Visit (INDEPENDENT_AMBULATORY_CARE_PROVIDER_SITE_OTHER): Payer: Medicare Other | Admitting: Family Medicine

## 2020-05-04 ENCOUNTER — Encounter: Payer: Self-pay | Admitting: Family Medicine

## 2020-05-04 VITALS — BP 120/78 | HR 91 | Temp 97.5°F | Resp 12 | Ht 63.0 in | Wt 134.1 lb

## 2020-05-04 DIAGNOSIS — J309 Allergic rhinitis, unspecified: Secondary | ICD-10-CM | POA: Diagnosis not present

## 2020-05-04 DIAGNOSIS — J4520 Mild intermittent asthma, uncomplicated: Secondary | ICD-10-CM | POA: Diagnosis not present

## 2020-05-04 MED ORDER — ALBUTEROL SULFATE HFA 108 (90 BASE) MCG/ACT IN AERS: 2.0000 | INHALATION_SPRAY | Freq: Four times a day (QID) | RESPIRATORY_TRACT | 3 refills | Status: DC | PRN

## 2020-05-04 MED ORDER — AEROCHAMBER PLUS MISC: 1 refills | Status: AC

## 2020-05-04 MED ORDER — FLOVENT HFA 110 MCG/ACT IN AERO: 1.0000 | INHALATION_SPRAY | Freq: Two times a day (BID) | RESPIRATORY_TRACT | 3 refills | Status: AC

## 2020-05-04 NOTE — Progress Notes (Signed)
Chief Complaint  Patient presents with  . Medication Refill   HPI: Leah Pierce is a 69 y.o. female, who is here today requesting refill for Albuterol inh. She is c/o recurrent asthma symptoms for the past couple weeks, 2-3 times per week. She has a long history of asthma,since childhood. Exacerbations are seldom, last one about 6 to 7 years ago. She started working part-time, exposed to odors/fumes and heat. Mask use also aggravates problem. She is having nocturnal symptoms, interfering with sleep.  Productive cough with clearish sputum in the morning, wheezing, and intermittent dyspnea with exertion.  Negative for fever, chills, night sweats, abnormal weight loss, anosmia,ageusia,CP, diaphoresis, or palpitations. Today she feels better. COVID 19 vaccination completed in 12/2019. No sick contact or recent travel.  + Nasal congestion and postnasal drainage. She uses Flonase nasal spray daily as needed. It seem to be worse with humidity and better if air is not circulating in the room.  Review of Systems  Constitutional: Negative for activity change, appetite change and fatigue.  HENT: Negative for mouth sores, nosebleeds and sore throat.   Eyes: Negative for discharge and itching.  Respiratory: Positive for chest tightness (Not today). Negative for stridor.   Cardiovascular: Negative for leg swelling.  Gastrointestinal: Negative for abdominal pain, nausea and vomiting.       Negative for changes in bowel habits.  Skin: Negative for rash and wound.  Allergic/Immunologic: Positive for environmental allergies.  Neurological: Negative for syncope, weakness and headaches.  Psychiatric/Behavioral: Negative for confusion. The patient is not nervous/anxious.   Rest of ROS, see pertinent positives sand negatives in HPI  Current Outpatient Medications on File Prior to Visit  Medication Sig Dispense Refill  . alendronate (FOSAMAX) 70 MG tablet TAKE 1 TABLET (70 MG TOTAL)  BY MOUTH EVERY 7 (SEVEN) DAYS. TAKE WITH A FULL GLASS OF WATER ON AN EMPTY STOMACH. 12 tablet 3  . atorvastatin (LIPITOR) 20 MG tablet Take 1 tablet (20 mg total) by mouth daily. 90 tablet 3  . BIOTIN PO Take by mouth as needed.    . Multiple Vitamin (MULTIVITAMIN WITH MINERALS) TABS tablet Take 1 tablet by mouth daily.    Marland Kitchen thiamine (VITAMIN B-1) 100 MG tablet Take 100 mg by mouth daily.    . vitamin E 100 UNIT capsule Take by mouth daily.     No current facility-administered medications on file prior to visit.   Past Medical History:  Diagnosis Date  . Allergy   . Arthritis   . Asthma    hx of  . Chicken pox   . Heart murmur   . Migraines    Allergies  Allergen Reactions  . Tetracyclines & Related     Rash on hands    Social History   Socioeconomic History  . Marital status: Single    Spouse name: Not on file  . Number of children: Not on file  . Years of education: Not on file  . Highest education level: Not on file  Occupational History  . Not on file  Tobacco Use  . Smoking status: Never Smoker  . Smokeless tobacco: Never Used  Vaping Use  . Vaping Use: Never used  Substance and Sexual Activity  . Alcohol use: Yes    Comment: occasionally  . Drug use: No  . Sexual activity: Not on file  Other Topics Concern  . Not on file  Social History Narrative  . Not on file   Social Determinants of Health  Financial Resource Strain:   . Difficulty of Paying Living Expenses:   Food Insecurity:   . Worried About Charity fundraiser in the Last Year:   . Arboriculturist in the Last Year:   Transportation Needs:   . Film/video editor (Medical):   Marland Kitchen Lack of Transportation (Non-Medical):   Physical Activity:   . Days of Exercise per Week:   . Minutes of Exercise per Session:   Stress:   . Feeling of Stress :   Social Connections:   . Frequency of Communication with Friends and Family:   . Frequency of Social Gatherings with Friends and Family:   . Attends  Religious Services:   . Active Member of Clubs or Organizations:   . Attends Archivist Meetings:   Marland Kitchen Marital Status:     Vitals:   05/04/20 1357  BP: 120/78  Pulse: 91  Resp: 12  Temp: (!) 97.5 F (36.4 C)  SpO2: 98%   Body mass index is 23.76 kg/m.  Physical Exam  Nursing note and vitals reviewed. Constitutional: She is oriented to person, place, and time. She appears well-developed. No distress.  HENT:  Head: Normocephalic and atraumatic.  Nose: Septal deviation present.  Mouth/Throat: Mucous membranes are moist.  Eyes: Pupils are equal, round, and reactive to light. Conjunctivae are normal.  Cardiovascular: Normal rate and regular rhythm.  No murmur heard. Respiratory: Effort normal and breath sounds normal. No respiratory distress.  GI: Normal appearance.  Lymphadenopathy:    She has no cervical adenopathy.  Neurological: She is alert and oriented to person, place, and time. No cranial nerve deficit. Gait normal.  Skin: Skin is warm. No rash noted. No erythema.  Psychiatric: Mood and affect normal.  Well groomed, good eye contact.   ASSESSMENT AND PLAN:  Leah Pierce was seen today for asthma follow-up.  No orders of the defined types were placed in this encounter.  Asthma, intermittent, uncomplicated Problem is not well controlled. Flovent 110 mcg twice daily added today, instructed to swish mouth after use. Albuterol inhaler 1 to 2 puff every 6 hours as needed for exacerbations. Recommend using a spacer. Instructed about warning signs. Keep next appointment.   Allergic rhinitis Continue Flonase nasal spray daily as needed. Nasal saline irrigations will also help. Singulair 10 mg can be added if symptoms still not well controlled.   Return if symptoms worsen or fail to improve, for Keep next appt.   Betty G. Martinique, MD  [facility]. [place] office.  A few things to remember from today's visit:   Asthma,  intermittent, uncomplicated - Plan: fluticasone (FLOVENT HFA) 110 MCG/ACT inhaler, albuterol (VENTOLIN HFA) 108 (90 Base) MCG/ACT inhaler, Spacer/Aero-Holding Chambers (AEROCHAMBER PLUS) inhaler  Allergic rhinitis, unspecified seasonality, unspecified trigger Today Flovent inhaler added, 1 puff twice daily using spacer. Albuterol as needed. Nasal saline irrigations may help with your nasal congestion. Continue Flonase daily as needed. I will see you back in September.  If you need refills please call your pharmacy. Do not use My Chart to request refills or for acute issues that need immediate attention.    Please be sure medication list is accurate. If a new problem present, please set up appointment sooner than planned today.

## 2020-05-04 NOTE — Assessment & Plan Note (Addendum)
Continue Flonase nasal spray daily as needed. Nasal saline irrigations will also help. Singulair 10 mg can be added if symptoms still not well controlled.

## 2020-05-04 NOTE — Patient Instructions (Signed)
A few things to remember from today's visit:   Asthma, intermittent, uncomplicated - Plan: fluticasone (FLOVENT HFA) 110 MCG/ACT inhaler, albuterol (VENTOLIN HFA) 108 (90 Base) MCG/ACT inhaler, Spacer/Aero-Holding Chambers (AEROCHAMBER PLUS) inhaler  Allergic rhinitis, unspecified seasonality, unspecified trigger Today Flovent inhaler added, 1 puff twice daily using spacer. Albuterol as needed. Nasal saline irrigations may help with your nasal congestion. Continue Flonase daily as needed. I will see you back in September.  If you need refills please call your pharmacy. Do not use My Chart to request refills or for acute issues that need immediate attention.    Please be sure medication list is accurate. If a new problem present, please set up appointment sooner than planned today.

## 2020-05-04 NOTE — Assessment & Plan Note (Signed)
Problem is not well controlled. Flovent 110 mcg twice daily added today, instructed to swish mouth after use. Albuterol inhaler 1 to 2 puff every 6 hours as needed for exacerbations. Recommend using a spacer. Instructed about warning signs. Keep next appointment.

## 2020-05-10 ENCOUNTER — Other Ambulatory Visit: Payer: Self-pay

## 2020-05-10 ENCOUNTER — Encounter: Payer: Self-pay | Admitting: Family Medicine

## 2020-05-10 ENCOUNTER — Telehealth (INDEPENDENT_AMBULATORY_CARE_PROVIDER_SITE_OTHER): Payer: Medicare Other | Admitting: Family Medicine

## 2020-05-10 VITALS — Temp 100.0°F

## 2020-05-10 DIAGNOSIS — R059 Cough, unspecified: Secondary | ICD-10-CM

## 2020-05-10 DIAGNOSIS — J45909 Unspecified asthma, uncomplicated: Secondary | ICD-10-CM | POA: Diagnosis not present

## 2020-05-10 DIAGNOSIS — R05 Cough: Secondary | ICD-10-CM

## 2020-05-10 MED ORDER — BENZONATATE 100 MG PO CAPS: 100.0000 mg | ORAL_CAPSULE | Freq: Three times a day (TID) | ORAL | 0 refills | Status: DC | PRN

## 2020-05-10 NOTE — Progress Notes (Signed)
Virtual Visit via Telephone Note  I connected with Leah Pierce on 05/10/20 at 10:40 AM EDT by telephone and verified that I am speaking with the correct person using two identifiers.   I discussed the limitations, risks, security and privacy concerns of performing an evaluation and management service by telephone and the availability of in person appointments. I also discussed with the patient that there may be a patient responsible charge related to this service. The patient expressed understanding and agreed to proceed.  Location patient: home Location provider: work or home office, [place] Participants present for the call: patient, provider Patient did not have a visit in the prior 7 days to address this/these issue(s).   History of Present Illness:  Acute visit for a cough: -reports started 2-3 days ago -she watches her nephew and he has had a cold after going to [facility] -pt has had a cough, nasal congestion, pnd, low grade fever around 100 -denies NVD, SOB, wheezing, bday aches, loss of taste, malaise, inability to get out of bed -has a history asthma - used the inhaler for a cough last night and it did help -she wants a prescription for a cough medication -she is fully vaccinated for COVID19 with the pfizer vaccine   Observations/Objective: Patient sounds cheerful and well on the phone. I do not appreciate any SOB. Speech and thought processing are grossly intact. Patient reported vitals:  Assessment and Plan:  Cough  Asthma, unspecified asthma severity, unspecified whether complicated, unspecified whether persistent  -we discussed possible serious and likely etiologies, options for evaluation and workup, limitations of telemedicine visit vs in person visit, treatment, treatment risks and precautions. Pt prefers to treat via telemedicine empirically rather then risking or undertaking an in person visit at this moment. Query viral upper resp illness, possible mild asthma  exacerbation. She opted to try Tessalon rx for cough after discussion risks - rx sent, along with albuterol prn and nasal saline, cough drops. Discussed COVID19 unlikely but not 100% impossible. Patient agrees to seek prompt in person care if worsening, new symptoms arise, or if is not improving with treatment.  Follow Up Instructions:   I did not refer this patient for an OV in the next 24 hours for this/these issue(s).  I discussed the assessment and treatment plan with the patient. The patient was provided an opportunity to ask questions and all were answered. The patient agreed with the plan and demonstrated an understanding of the instructions.   The patient was advised to call back or seek an in-person evaluation if the symptoms worsen or if the condition fails to improve as anticipated.  I provided 15 minutes of non-face-to-face time during this encounter.   Terressa Koyanagi, DO

## 2020-06-06 ENCOUNTER — Other Ambulatory Visit: Payer: Self-pay | Admitting: Family Medicine

## 2020-06-06 DIAGNOSIS — Z1231 Encounter for screening mammogram for malignant neoplasm of breast: Secondary | ICD-10-CM

## 2020-07-04 ENCOUNTER — Ambulatory Visit
Admission: RE | Admit: 2020-07-04 | Discharge: 2020-07-04 | Disposition: A | Discharge status: Home / Self Care | Payer: Medicare Other | Source: Ambulatory Visit | Attending: Family Medicine | Admitting: Family Medicine

## 2020-07-04 ENCOUNTER — Other Ambulatory Visit: Payer: Self-pay

## 2020-07-04 DIAGNOSIS — Z1231 Encounter for screening mammogram for malignant neoplasm of breast: Secondary | ICD-10-CM | POA: Diagnosis not present

## 2020-08-08 ENCOUNTER — Ambulatory Visit (INDEPENDENT_AMBULATORY_CARE_PROVIDER_SITE_OTHER): Payer: Medicare Other | Admitting: Family Medicine

## 2020-08-08 ENCOUNTER — Other Ambulatory Visit: Payer: Self-pay

## 2020-08-08 ENCOUNTER — Encounter: Payer: Self-pay | Admitting: Family Medicine

## 2020-08-08 VITALS — BP 110/70 | HR 82 | Temp 97.9°F | Resp 16 | Ht 63.0 in | Wt 130.0 lb

## 2020-08-08 DIAGNOSIS — R7303 Prediabetes: Secondary | ICD-10-CM | POA: Insufficient documentation

## 2020-08-08 DIAGNOSIS — M816 Localized osteoporosis [Lequesne]: Secondary | ICD-10-CM

## 2020-08-08 DIAGNOSIS — J309 Allergic rhinitis, unspecified: Secondary | ICD-10-CM | POA: Diagnosis not present

## 2020-08-08 DIAGNOSIS — Z0000 Encounter for general adult medical examination without abnormal findings: Secondary | ICD-10-CM

## 2020-08-08 DIAGNOSIS — J4520 Mild intermittent asthma, uncomplicated: Secondary | ICD-10-CM | POA: Diagnosis not present

## 2020-08-08 DIAGNOSIS — E785 Hyperlipidemia, unspecified: Secondary | ICD-10-CM | POA: Diagnosis not present

## 2020-08-08 NOTE — Patient Instructions (Addendum)
  Leah Pierce , Thank you for taking time to come for your Medicare Wellness Visit. I appreciate your ongoing commitment to your health goals. Please review the following plan we discussed and let me know if I can assist you in the future.   These are the goals we discussed: Goals    . Exercise 3x per week (30 min per time)     10,000 steps daily. Continue healthful diet. Fall precautions.        This is a list of the screening recommended for you and due dates:  Health Maintenance  Topic Date Due  . Mammogram  07/04/2022  . Colon Cancer Screening  06/02/2027  . DEXA scan (bone density measurement)  Completed  . COVID-19 Vaccine  Completed  .  Hepatitis C: One time screening is recommended by Center for Disease Control  (CDC) for  adults born from [birth year] through 1964.   Completed  . Pneumonia vaccines  Completed  . Flu Shot  Discontinued  . Tetanus Vaccine  Discontinued   A few things to remember from today's visit:   Hyperlipidemia, unspecified hyperlipidemia type  Asthma, intermittent, uncomplicated  Allergic rhinitis, unspecified seasonality, unspecified trigger  Medicare annual wellness visit, subsequent  If you need refills please call your pharmacy. Do not use My Chart to request refills or for acute issues that need immediate attention.    Please be sure medication list is accurate. If a new problem present, please set up appointment sooner than planned today.  A few tips:  -As we age balance is not as good as it was, so there is a higher risks for falls. Please remove small rugs and furniture that is "in your way" and could increase the risk of falls. Stretching exercises may help with fall prevention: Yoga and Tai Chi are some examples. Low impact exercise is better, so you are not very achy the next day.  -Sun screen and avoidance of direct sun light recommended. Caution with dehydration, if working outdoors be sure to drink enough fluids.  - Some medications  are not safe as we age, increases the risk of side effects and can potentially interact with other medication you are also taken;  including some of over the counter medications. Be sure to let me know when you start a new medication even if it is a dietary/vitamin supplement.   -Healthy diet low in red meet/animal fat and sugar + regular physical activity is recommended.

## 2020-08-08 NOTE — Progress Notes (Signed)
HPI: [name] is a 69 y.o. female, who is here today for chronic disease management and AWV.   She was last seen on 05/04/20 because worsening asthma. She is on Albuterol inh, which helps when needed. She has not used Flovent daily, she does not feel like she needs it at this time. She does not have exacerbations often.  Allergic rhinitis:Occasional rhinorrhea. Negative for facial/sinus pain. She was seen on 05/10/20 because cough,nasal congestion, and low grade fever. Symptoms have resolved.  Last AWV on 08/04/20.  She lives with her friend (female). Independent ADL's and IADL's. No falls in the past year and denies depression symptoms.  Functional Status Survey: Is the patient deaf or have difficulty hearing?: No Does the patient have difficulty seeing, even when wearing glasses/contacts?: No Does the patient have difficulty concentrating, remembering, or making decisions?: No Does the patient have difficulty walking or climbing stairs?: No Does the patient have difficulty dressing or bathing?: No Does the patient have difficulty doing errands alone such as visiting a doctor's office or shopping?: No  Fall Risk  08/08/2020 02/03/2020 08/05/2019 04/20/2018  Falls in the past year? _0 Yes  Number falls in past yr: 0 0 0 -  Comment - - Tripped with scale 7 months ago, home. -  Injury with Fall? 1 1 0 No  Comment - Right wrist fx - -  Risk for fall due to : History of fall(s) - - Other (Comment)  Follow up Education provided Education provided Education provided -   Providers she sees regularly: Eye care provider: Dr Nancy Fetter (ophthalmologist) and optometrist Dr Peter Garter.  Depression screen [facility] 2/9 08/08/2020  Decreased Interest 0  Down, Depressed, Hopeless 0  PHQ - 2 Score 0    Mini-Cog - 08/08/20 1304    Normal clock drawing test? yes    How many words correct? 3           Hearing Screening   _1  _2  _3  _4  _5  _6  _7  _8  _9   Right  ear:           Left ear:             Visual Acuity Screening   Right eye Left eye Both eyes  Without correction:     With correction: _10   Immunization History  Administered Date(s) Administered  . PFIZER SARS-COV-2 Vaccination 12/08/2019, 12/29/2019  . Pneumococcal Conjugate-13 04/21/2018  . Pneumococcal Polysaccharide-23 02/03/2020   Mammogram: 07/05/20. Colonoscopy: 06/01/17.  No hx of HTN,DM,or CAD.  HLD: She is on Lipitor 20 mg daily. She had a home visit last months according to pt,she had FLP and blood work done. Lab Results  Component Value Date   CHOL 164 02/03/2020   HDL 65.50 02/03/2020   LDLCALC 80 02/03/2020   TRIG 90.0 02/03/2020   CHOLHDL 3 02/03/2020   Prediabetes: Negative for abdominal pain, nausea,vomiting, polydipsia,polyuria, or polyphagia.  Lab Results  Component Value Date   HGBA1C 5.9 08/10/2019   Osteoporosis:  DEXA 2019. She sometimes forget to take Fosamax 75 mg. She has tolerated medications well. Taking Ca++ and vit D supplementation.  She is not exercising regularly but active through work, working more hours per week. She usually follows a healthful diet but lately because work schedule she has not been consistent.  Review of Systems  Constitutional: Negative for activity change, appetite change, fatigue and fever.  HENT: Negative for mouth sores, nosebleeds and sore throat.   Eyes: Negative for  redness and visual disturbance.  Respiratory: Negative for cough, shortness of breath and wheezing.   Cardiovascular: Negative for chest pain, palpitations and leg swelling.  Gastrointestinal:       Negative for changes in bowel habits.  Genitourinary: Negative for decreased urine volume, dysuria and hematuria.  Musculoskeletal: Negative for gait problem and myalgias.  Allergic/Immunologic: Positive for environmental allergies.  Neurological: Negative for syncope, weakness and headaches.  Psychiatric/Behavioral: Negative for  confusion. The patient is not nervous/anxious.   Rest of ROS, see pertinent positives sand negatives in HPI  Current Outpatient Medications on File Prior to Visit  Medication Sig Dispense Refill  . albuterol (VENTOLIN HFA) 108 (90 Base) MCG/ACT inhaler Inhale 2 puffs into the lungs every 6 (six) hours as needed for wheezing or shortness of breath. 18 g 3  . BIOTIN PO Take by mouth as needed.    . fluticasone (FLOVENT HFA) 110 MCG/ACT inhaler Inhale 1 puff into the lungs in the morning and at bedtime. 1 Inhaler 3  . Multiple Vitamin (MULTIVITAMIN WITH MINERALS) TABS tablet Take 1 tablet by mouth daily.    Marland Kitchen Spacer/Aero-Holding Chambers (AEROCHAMBER PLUS) inhaler Use as instructed to use with inahaler. 1 each 1  . thiamine (VITAMIN B-1) 100 MG tablet Take 100 mg by mouth daily.    . vitamin E 100 UNIT capsule Take by mouth daily.     No current facility-administered medications on file prior to visit.   Past Medical History:  Diagnosis Date  . Allergy   . Arthritis   . Asthma    hx of  . Chicken pox   . Heart murmur   . Migraines    Allergies  Allergen Reactions  . Tetracyclines & Related     Rash on hands    Social History   Socioeconomic History  . Marital status: Single    Spouse name: Not on file  . Number of children: Not on file  . Years of education: Not on file  . Highest education level: Not on file  Occupational History  . Not on file  Tobacco Use  . Smoking status: Never Smoker  . Smokeless tobacco: Never Used  Vaping Use  . Vaping Use: Never used  Substance and Sexual Activity  . Alcohol use: Yes    Comment: occasionally  . Drug use: No  . Sexual activity: Not on file  Other Topics Concern  . Not on file  Social History Narrative  . Not on file   Social Determinants of Health   Financial Resource Strain:   . Difficulty of Paying Living Expenses: Not on file  Food Insecurity:   . Worried About Charity fundraiser in the Last Year: Not on file  .  Ran Out of Food in the Last Year: Not on file  Transportation Needs:   . Lack of Transportation (Medical): Not on file  . Lack of Transportation (Non-Medical): Not on file  Physical Activity:   . Days of Exercise per Week: Not on file  . Minutes of Exercise per Session: Not on file  Stress:   . Feeling of Stress : Not on file  Social Connections:   . Frequency of Communication with Friends and Family: Not on file  . Frequency of Social Gatherings with Friends and Family: Not on file  . Attends Religious Services: Not on file  . Active Member of Clubs or Organizations: Not on file  . Attends Archivist Meetings: Not on file  . Marital  Status: Not on file   Vitals:   08/08/20 1204  BP: 110/70  Pulse: 82  Resp: 16  Temp: 97.9 F (36.6 C)  SpO2: 99%   Body mass index is 23.03 kg/m.  Physical Exam Vitals and nursing note reviewed.  Constitutional:      General: She is not in acute distress.    Appearance: She is well-developed and normal weight.  HENT:     Head: Normocephalic and atraumatic.     Mouth/Throat:     Mouth: Mucous membranes are moist.     Pharynx: Oropharynx is clear.  Eyes:     Conjunctiva/sclera: Conjunctivae normal.     Pupils: Pupils are equal, round, and reactive to light.  Cardiovascular:     Rate and Rhythm: Normal rate and regular rhythm.     Pulses:          Dorsalis pedis pulses are 2+ on the right side and 2+ on the left side.     Heart sounds: No murmur heard.   Pulmonary:     Effort: Pulmonary effort is normal. No respiratory distress.     Breath sounds: Normal breath sounds.  Abdominal:     Palpations: Abdomen is soft. There is no hepatomegaly or mass.     Tenderness: There is no abdominal tenderness.  Lymphadenopathy:     Cervical: No cervical adenopathy.  Skin:    General: Skin is warm.     Findings: No erythema or rash.  Neurological:     General: No focal deficit present.     Mental Status: She is alert and oriented to  person, place, and time.     Cranial Nerves: No cranial nerve deficit.     Gait: Gait normal.  Psychiatric:        Mood and Affect: Mood and affect normal.     Comments: Well groomed, good eye contact.    ASSESSMENT AND PLAN:  [name] was seen today for AWV and follow-up.  Diagnoses and all orders for this visit:  Medicare annual wellness visit, subsequent We discussed the importance of staying active, physically and mentally, as well as the benefits of a healthy/balance diet. Low impact exercise that involve stretching and strengthing are ideal. Vaccines up to date. We discussed preventive screening for the next 5-10 years, summery of recommendations given in AVS. Fall prevention.  Advance directives and end of life discussed, she has POA and living will.   Hyperlipidemia, unspecified hyperlipidemia type Reporting FLP done during home visit, fine. Continue Atorvastatin 20 mg daily and low fat diet.  Asthma, intermittent, uncomplicated Well controlled. Continue Albuterol inh q 4-6 hours as needed. Flovent 110 mcg recommend daily use during seasons that aggravate problem.  Allergic rhinitis, unspecified seasonality, unspecified trigger Well controlled. Nasal irrigations as needed with saline water.  Localized osteoporosis without current pathological fracture No changes in current management. Fall precautions and regular physical activity. She can put an alarm in her phone to remind her to take her medications.  Prediabetes Healthy life style for primary prevention. Labs done at home recently, will try to obtain copy.   Return in about 1 year (around 08/08/2021) for AWV and f/u.   Joangel Vanosdol G. [place], MD  [facility]. [place] office.   [name] , Thank you for taking time to come for your Medicare Wellness Visit. I appreciate your ongoing commitment to your health goals. Please review the following plan we discussed and let me know if I  can  assist you in the future.   These are the goals we discussed: Goals    . Exercise 3x per week (30 min per time)     10,000 steps daily. Continue healthful diet. Fall precautions.        This is a list of the screening recommended for you and due dates:  Health Maintenance  Topic Date Due  . Mammogram  07/04/2022  . Colon Cancer Screening  06/02/2027  . DEXA scan (bone density measurement)  Completed  . COVID-19 Vaccine  Completed  .  Hepatitis C: One time screening is recommended by Center for Disease Control  (CDC) for  adults born from [birth year] through 1964.   Completed  . Pneumonia vaccines  Completed  . Flu Shot  Discontinued  . Tetanus Vaccine  Discontinued   A few things to remember from today's visit:   Hyperlipidemia, unspecified hyperlipidemia type  Asthma, intermittent, uncomplicated  Allergic rhinitis, unspecified seasonality, unspecified trigger  Medicare annual wellness visit, subsequent  If you need refills please call your pharmacy. Do not use My Chart to request refills or for acute issues that need immediate attention.    Please be sure medication list is accurate. If a new problem present, please set up appointment sooner than planned today.  A few tips:  -As we age balance is not as good as it was, so there is a higher risks for falls. Please remove small rugs and furniture that is "in your way" and could increase the risk of falls. Stretching exercises may help with fall prevention: Yoga and Tai Chi are some examples. Low impact exercise is better, so you are not very achy the next day.  -Sun screen and avoidance of direct sun light recommended. Caution with dehydration, if working outdoors be sure to drink enough fluids.  - Some medications are not safe as we age, increases the risk of side effects and can potentially interact with other medication you are also taken;  including some of over the counter medications. Be sure to let me know when  you start a new medication even if it is a dietary/vitamin supplement.   -Healthy diet low in red meet/animal fat and sugar + regular physical activity is recommended.

## 2020-08-11 MED ORDER — ATORVASTATIN CALCIUM 20 MG PO TABS: 20.0000 mg | ORAL_TABLET | Freq: Every day | ORAL | 3 refills | Status: DC

## 2020-08-11 MED ORDER — ALENDRONATE SODIUM 70 MG PO TABS: 70.0000 mg | ORAL_TABLET | ORAL | 3 refills | Status: DC

## 2020-09-01 ENCOUNTER — Ambulatory Visit: Payer: Medicare Other | Attending: Internal Medicine

## 2020-09-01 ENCOUNTER — Other Ambulatory Visit: Payer: Self-pay

## 2020-09-01 DIAGNOSIS — Z23 Encounter for immunization: Secondary | ICD-10-CM

## 2020-09-01 NOTE — Progress Notes (Signed)
   Covid-19 Vaccination Clinic  Name:  Agata Lucente    MRN: [id number] DOB: 08/25/1951  09/01/2020  Ms. Burgio was observed post Covid-19 immunization for 15 minutes without incident. She was provided with Vaccine Information Sheet and instruction to access the V-Safe system.   Ms. Hodgkin was instructed to call 911 with any severe reactions post vaccine: Marland Kitchen Difficulty breathing  . Swelling of face and throat  . A fast heartbeat  . A bad rash all over body  . Dizziness and weakness

## 2020-12-10 DIAGNOSIS — H5203 Hypermetropia, bilateral: Secondary | ICD-10-CM | POA: Diagnosis not present

## 2020-12-10 DIAGNOSIS — H52223 Regular astigmatism, bilateral: Secondary | ICD-10-CM | POA: Diagnosis not present

## 2020-12-10 DIAGNOSIS — H26491 Other secondary cataract, right eye: Secondary | ICD-10-CM | POA: Diagnosis not present

## 2020-12-10 DIAGNOSIS — H31012 Macula scars of posterior pole (postinflammatory) (post-traumatic), left eye: Secondary | ICD-10-CM | POA: Diagnosis not present

## 2020-12-10 DIAGNOSIS — H524 Presbyopia: Secondary | ICD-10-CM | POA: Diagnosis not present

## 2021-02-06 ENCOUNTER — Telehealth (INDEPENDENT_AMBULATORY_CARE_PROVIDER_SITE_OTHER): Payer: Medicare Other | Admitting: Family Medicine

## 2021-02-06 ENCOUNTER — Encounter: Payer: Self-pay | Admitting: Family Medicine

## 2021-02-06 VITALS — Ht 63.0 in

## 2021-02-06 DIAGNOSIS — J988 Other specified respiratory disorders: Secondary | ICD-10-CM

## 2021-02-06 DIAGNOSIS — R0602 Shortness of breath: Secondary | ICD-10-CM | POA: Diagnosis not present

## 2021-02-06 DIAGNOSIS — J45901 Unspecified asthma with (acute) exacerbation: Secondary | ICD-10-CM | POA: Diagnosis not present

## 2021-02-06 MED ORDER — BENZONATATE 100 MG PO CAPS: 200.0000 mg | ORAL_CAPSULE | Freq: Two times a day (BID) | ORAL | 0 refills | Status: AC | PRN

## 2021-02-06 MED ORDER — PREDNISONE 20 MG PO TABS: 40.0000 mg | ORAL_TABLET | Freq: Every day | ORAL | 0 refills | Status: AC

## 2021-02-06 MED ORDER — ALBUTEROL SULFATE HFA 108 (90 BASE) MCG/ACT IN AERS: 2.0000 | INHALATION_SPRAY | Freq: Four times a day (QID) | RESPIRATORY_TRACT | 3 refills | Status: AC | PRN

## 2021-02-06 NOTE — Progress Notes (Signed)
Patient scheduled Monday 02/11/2021 virtual

## 2021-02-06 NOTE — Progress Notes (Signed)
Virtual Visit via Video Note I connected with Leah Pierce on 02/06/21 by a video enabled telemedicine application and verified that I am speaking with the correct person using two identifiers.  Location patient: home Location provider:home office Persons participating in the virtual visit: patient, provider  I discussed the limitations of evaluation and management by telemedicine and the availability of in person appointments. The patient expressed understanding and agreed to proceed.  Chief Complaint  Patient presents with  . Asthma    Cough,wheezing,and SOB   HPI: Leah Pierce is a 70 yo female with hx of allergic rhinitis,asthma,and HLD c/o 2 days of respiratory symptoms. Productive cough with clear sputum, negative for hemoptysis. Chills,subjective fever,fatigue,decreased appetite,rhinorrhea,headache,DOE,and wheezing. Symptoms exacerbated by exertion. She has been in bed since yesterday. Feeling slightly better today.  Chest wall soreness when coughing and with deep breathing.  Negative for sore throat, anosmia,ageusia,abdominal pain,N/V,changes in bowel habits,urinary symptoms,myalgias,or rash. Negative for palpitations.  She has not taken OTC medication. No known sick contact. No hx of travel. COVID 19 vaccination completed.  Asthma on Albuterol inh, last used this morning. She is also on Flovent 110 mcg 1 puff bid.  ROS: See pertinent positives and negatives per HPI.  Past Medical History:  Diagnosis Date  . Allergy   . Arthritis   . Asthma    hx of  . Chicken pox   . Heart murmur   . Migraines     Past Surgical History:  Procedure Laterality Date  . CESAREAN SECTION    . COLONOSCOPY    . TONSILLECTOMY  1959   Family History  Problem Relation Age of Onset  . Arthritis Mother   . Stroke Mother   . Hypertension Mother   . Diabetes Mother   . Colon polyps Mother   . Arthritis Father   . Cancer Father        Prostate  . Cancer Brother        prostate  .  Cancer Brother        Pancreas  . Colon cancer Neg Hx   . Esophageal cancer Neg Hx   . Rectal cancer Neg Hx   . Stomach cancer Neg Hx     Social History   Socioeconomic History  . Marital status: Single    Spouse name: Not on file  . Number of children: Not on file  . Years of education: Not on file  . Highest education level: Not on file  Occupational History  . Not on file  Tobacco Use  . Smoking status: Never Smoker  . Smokeless tobacco: Never Used  Vaping Use  . Vaping Use: Never used  Substance and Sexual Activity  . Alcohol use: Yes    Comment: occasionally  . Drug use: No  . Sexual activity: Not on file  Other Topics Concern  . Not on file  Social History Narrative  . Not on file   Social Determinants of Health   Financial Resource Strain: Not on file  Food Insecurity: Not on file  Transportation Needs: Not on file  Physical Activity: Not on file  Stress: Not on file  Social Connections: Not on file  Intimate Partner Violence: Not on file    Current Outpatient Medications:  .  benzonatate (TESSALON) 100 MG capsule, Take 2 capsules (200 mg total) by mouth 2 (two) times daily as needed for up to 10 days., Disp: 40 capsule, Rfl: 0 .  predniSONE (DELTASONE) 20 MG tablet, Take 2 tablets (40 mg  total) by mouth daily with breakfast for 5 days., Disp: 10 tablet, Rfl: 0 .  albuterol (VENTOLIN HFA) 108 (90 Base) MCG/ACT inhaler, Inhale 2 puffs into the lungs every 6 (six) hours as needed for wheezing or shortness of breath., Disp: 18 g, Rfl: 3 .  alendronate (FOSAMAX) 70 MG tablet, Take 1 tablet (70 mg total) by mouth every 7 (seven) days. Take with a full glass of water on an empty stomach., Disp: 12 tablet, Rfl: 3 .  atorvastatin (LIPITOR) 20 MG tablet, Take 1 tablet (20 mg total) by mouth daily., Disp: 90 tablet, Rfl: 3 .  BIOTIN PO, Take by mouth as needed., Disp: , Rfl:  .  fluticasone (FLOVENT HFA) 110 MCG/ACT inhaler, Inhale 1 puff into the lungs in the morning  and at bedtime., Disp: 1 Inhaler, Rfl: 3 .  Multiple Vitamin (MULTIVITAMIN WITH MINERALS) TABS tablet, Take 1 tablet by mouth daily., Disp: , Rfl:  .  Spacer/Aero-Holding Chambers (AEROCHAMBER PLUS) inhaler, Use as instructed to use with inahaler., Disp: 1 each, Rfl: 1 .  thiamine (VITAMIN B-1) 100 MG tablet, Take 100 mg by mouth daily., Disp: , Rfl:  .  vitamin E 100 UNIT capsule, Take by mouth daily., Disp: , Rfl:   EXAM:  VITALS per patient if applicable:Ht 5\' 3"  (1.6 m)   BMI 23.03 kg/m   GENERAL: alert, oriented, appears well and in no acute distress  HEENT: atraumatic, conjunctiva clear, no obvious abnormalities on inspection of external nose and ears  NECK: normal movements of the head and neck  LUNGS: on inspection no signs of respiratory distress, breathing rate appears normal, no obvious gross SOB, gasping or wheezing. Episodes of non productive cough during visit.  CV: no obvious cyanosis  MS: moves all visible extremities without noticeable abnormality  PSYCH/NEURO: pleasant and cooperative, no obvious depression or anxiety, speech and thought processing grossly intact  ASSESSMENT AND PLAN:  Discussed the following assessment and plan:  Moderate asthma with exacerbation, unspecified whether persistent - Plan: predniSONE (DELTASONE) 20 MG tablet, albuterol (VENTOLIN HFA) 108 (90 Base) MCG/ACT inhaler, DG Chest 2 View After discussion of some side effects of Prednisone, she agrees with short course. Instructed to take medication with food, ideally breakfast. Albuterol inh 2 puff every 6 hours for a week then as needed for wheezing or shortness of breath.  Continue Flovent 110 mcg bid. Clearly instructed about warning signs.  Respiratory tract infection - Plan: benzonatate (TESSALON) 100 MG capsule Hx suggest viral etiology. Symptomatic treatment recommended for now. We tried to schedule COVID 19 test through [facility] but I did not see availability. She has COVID 19  home test provided by [employer], so instructed to test today. If positive,she needs PCR done at [facility], if negative she will repeat in 24-48 hours. Contact precautions discussed.  SOB (shortness of breath) - Plan: DG Chest 2 View We discussed possible etiologies,RAD/asthma exacerbation. CXR order placed to be done today or tomorrow Am. She was clearly instructed about warning signs, she voices understanding. Her sister is staying with her.  We discussed possible serious and likely etiologies, options for evaluation and workup, limitations of telemedicine visit vs in person visit, treatment, treatment risks and precautions.  I discussed the assessment and treatment plan with the patient. Leah Pierce was provided an opportunity to ask questions and all were answered. She agreed with the plan and demonstrated an understanding of the instructions.   Return in about 5 days (around 02/11/2021).   Victory Dresden 02/13/2021, MD

## 2021-02-07 ENCOUNTER — Ambulatory Visit (INDEPENDENT_AMBULATORY_CARE_PROVIDER_SITE_OTHER)
Admission: RE | Admit: 2021-02-07 | Discharge: 2021-02-07 | Disposition: A | Discharge status: Home / Self Care | Payer: Medicare Other | Source: Ambulatory Visit | Attending: Family Medicine | Admitting: Family Medicine

## 2021-02-07 ENCOUNTER — Telehealth: Payer: Self-pay | Admitting: Family Medicine

## 2021-02-07 ENCOUNTER — Other Ambulatory Visit: Payer: Self-pay

## 2021-02-07 DIAGNOSIS — J45901 Unspecified asthma with (acute) exacerbation: Secondary | ICD-10-CM | POA: Diagnosis not present

## 2021-02-07 DIAGNOSIS — R062 Wheezing: Secondary | ICD-10-CM | POA: Diagnosis not present

## 2021-02-07 DIAGNOSIS — Z20822 Contact with and (suspected) exposure to covid-19: Secondary | ICD-10-CM | POA: Diagnosis not present

## 2021-02-07 DIAGNOSIS — R0602 Shortness of breath: Secondary | ICD-10-CM | POA: Diagnosis not present

## 2021-02-07 DIAGNOSIS — I517 Cardiomegaly: Secondary | ICD-10-CM | POA: Diagnosis not present

## 2021-02-07 DIAGNOSIS — R059 Cough, unspecified: Secondary | ICD-10-CM | POA: Diagnosis not present

## 2021-02-07 NOTE — Telephone Encounter (Signed)
Called Ms Ellington to check on her status. In general she is feeling better. She had CXR done today. Started Prednisone and taking Albuterol inh 2 puff q 6 hours. No side effects reported.  Today she has been able to work around her house, drinking fluids with no problem. Appetite has not improved. No new symptoms. Fatigue and cough have improved. Cannot sleep through the night, sleeping 3-4 hours at the time.  Chest wall soreness resolved. SOB is "not as bad", worse at night and when lying down on her back. Feels better when lying on right side. Negative for palpitations,decreased urine output,LE edema or PND. + Copious rhinorrhea and nasal congestion.  She has not checked temp and did not have home COVID 19 test done.  Temp now 97.4 F forehead. CXR I do not appreciate infiltrates.Pending report.  Plan: Continue treatment. For now are holding on blood work. Instructed about warning signs. Note for work will be provided, going back Monday 02/11/21 if symptoms have resolved. She voices understanding and agrees with plan. Alejos Reinhardt [place], MD

## 2021-02-08 ENCOUNTER — Encounter: Payer: Self-pay | Admitting: Family Medicine

## 2021-02-11 ENCOUNTER — Encounter: Payer: Self-pay | Admitting: Family Medicine

## 2021-02-11 ENCOUNTER — Telehealth (INDEPENDENT_AMBULATORY_CARE_PROVIDER_SITE_OTHER): Payer: Medicare Other | Admitting: Family Medicine

## 2021-02-11 VITALS — Ht 63.0 in

## 2021-02-11 DIAGNOSIS — J45901 Unspecified asthma with (acute) exacerbation: Secondary | ICD-10-CM

## 2021-02-11 NOTE — Progress Notes (Signed)
Virtual Visit via Telephone Note I connected with Leah Pierce on 02/11/21 at  9:30 AM EDT by telephone and verified that I am speaking with the correct person using two identifiers.   I discussed the limitations, risks, security and privacy concerns of performing an evaluation and management service by telephone and the availability of in person appointments. I also discussed with the patient that there may be a patient responsible charge related to this service. The patient expressed understanding and agreed to proceed.  Location patient: home Location provider: Home office Participants present for the call: patient, provider Patient did not have a visit in the prior 7 days to address this/these issue(s).  Chief Complaint  Patient presents with  . Follow-up   History of Present Illness: Leah Pierce is a 70 year old female with history of allergies and asthma following on recent virtual visit. She was seen on 02/06/21 because of respiratory symptoms. Currently she is on prednisone 40 mg daily, which she has tolerated well. She is also on albuterol inhaler 2 puffs every 6 hours Flovent 110 mcg 1 puff twice daily.  CXR done on 02/07/21 negative for pneumonia. 1. Mild cardiomegaly. 2. Mild peribronchial thickening suggestive of asthma. Streaky left lung base opacity favors atelectasis or scarring.  She reporting great improvement of respiratory symptoms.  She is no longer having shortness of breath or wheezing. Negative for fever, chills, sore throat, CP/tightness, palpitations, diaphoresis, nausea, vomiting, changes in bowel habits, abdominal pain, urinary symptoms, or skin rash. Fatigue has resolved. She is still but "not as much", clearly sputum.  Appetite is "good" and for the past 3 days she has been able to sleep throughout the night. Negative for orthopnea, PND, or edema. COVID-19 home testing negative x2.  She has no other concerns today.    Observations/Objective: Patient sounds cheerful and well on the phone. I do not appreciate any SOB. Speech and thought processing are grossly intact. Patient reported vitals:Ht 5\' 3"  (1.6 m)   BMI 23.03 kg/m   Assessment and Plan:  1. Moderate asthma with exacerbation, unspecified whether persistent Symptoms have greatly improved. Complete 5 days of oral Prednisone, today it is her last dose. Complete 7 days of Albuterol inh 2 puff q 6 hours then prn. We discussed CXR findings, no further follow up recommended at this time.  Continue Flovent 110 mcg bid. Instructed about warning signs.  She voices understanding and agrees with plan.  Follow Up Instructions:   I did not refer this patient for an OV in the next 24 hours for this/these issue(s).  I discussed the assessment and treatment plan with the patient. [name] was provided an opportunity to ask questions and all were answered.She agreed with the plan and demonstrated an understanding of the instructions.   She was advised to call back or seek an in-person evaluation if the symptoms worsen or if the condition fails to improve as anticipated.  I provided 8 minutes of non-face-to-face time during this encounter.   Merrit Friesen Anne Hahn, MD

## 2021-03-15 ENCOUNTER — Encounter (HOSPITAL_COMMUNITY): Payer: Self-pay | Admitting: Neurology

## 2021-03-15 ENCOUNTER — Other Ambulatory Visit: Payer: Self-pay

## 2021-03-15 ENCOUNTER — Emergency Department (HOSPITAL_COMMUNITY): Payer: Medicare Other

## 2021-03-15 ENCOUNTER — Inpatient Hospital Stay (HOSPITAL_COMMUNITY)
Admission: EM | Admit: 2021-03-15 | Discharge: 2021-03-19 | DRG: 061 | Disposition: A | Discharge status: Home / Self Care | Payer: Medicare Other | Attending: Neurology | Admitting: Neurology

## 2021-03-15 DIAGNOSIS — I639 Cerebral infarction, unspecified: Secondary | ICD-10-CM | POA: Diagnosis present

## 2021-03-15 DIAGNOSIS — I341 Nonrheumatic mitral (valve) prolapse: Secondary | ICD-10-CM | POA: Diagnosis not present

## 2021-03-15 DIAGNOSIS — Z20822 Contact with and (suspected) exposure to covid-19: Secondary | ICD-10-CM | POA: Diagnosis present

## 2021-03-15 DIAGNOSIS — R9431 Abnormal electrocardiogram [ECG] [EKG]: Secondary | ICD-10-CM | POA: Diagnosis not present

## 2021-03-15 DIAGNOSIS — G43909 Migraine, unspecified, not intractable, without status migrainosus: Secondary | ICD-10-CM | POA: Diagnosis not present

## 2021-03-15 DIAGNOSIS — I5021 Acute systolic (congestive) heart failure: Secondary | ICD-10-CM | POA: Diagnosis not present

## 2021-03-15 DIAGNOSIS — R7303 Prediabetes: Secondary | ICD-10-CM | POA: Diagnosis present

## 2021-03-15 DIAGNOSIS — I429 Cardiomyopathy, unspecified: Secondary | ICD-10-CM | POA: Diagnosis not present

## 2021-03-15 DIAGNOSIS — I6522 Occlusion and stenosis of left carotid artery: Secondary | ICD-10-CM

## 2021-03-15 DIAGNOSIS — I6529 Occlusion and stenosis of unspecified carotid artery: Secondary | ICD-10-CM | POA: Diagnosis not present

## 2021-03-15 DIAGNOSIS — F1210 Cannabis abuse, uncomplicated: Secondary | ICD-10-CM | POA: Diagnosis present

## 2021-03-15 DIAGNOSIS — Z8261 Family history of arthritis: Secondary | ICD-10-CM | POA: Diagnosis not present

## 2021-03-15 DIAGNOSIS — I6523 Occlusion and stenosis of bilateral carotid arteries: Secondary | ICD-10-CM | POA: Diagnosis not present

## 2021-03-15 DIAGNOSIS — F1220 Cannabis dependence, uncomplicated: Secondary | ICD-10-CM | POA: Diagnosis not present

## 2021-03-15 DIAGNOSIS — G8191 Hemiplegia, unspecified affecting right dominant side: Secondary | ICD-10-CM | POA: Diagnosis not present

## 2021-03-15 DIAGNOSIS — I959 Hypotension, unspecified: Secondary | ICD-10-CM | POA: Diagnosis not present

## 2021-03-15 DIAGNOSIS — R4781 Slurred speech: Secondary | ICD-10-CM | POA: Diagnosis not present

## 2021-03-15 DIAGNOSIS — Z823 Family history of stroke: Secondary | ICD-10-CM | POA: Diagnosis not present

## 2021-03-15 DIAGNOSIS — J45909 Unspecified asthma, uncomplicated: Secondary | ICD-10-CM | POA: Diagnosis not present

## 2021-03-15 DIAGNOSIS — Z79899 Other long term (current) drug therapy: Secondary | ICD-10-CM | POA: Diagnosis not present

## 2021-03-15 DIAGNOSIS — Z8249 Family history of ischemic heart disease and other diseases of the circulatory system: Secondary | ICD-10-CM

## 2021-03-15 DIAGNOSIS — I63512 Cerebral infarction due to unspecified occlusion or stenosis of left middle cerebral artery: Secondary | ICD-10-CM | POA: Diagnosis not present

## 2021-03-15 DIAGNOSIS — Z7982 Long term (current) use of aspirin: Secondary | ICD-10-CM

## 2021-03-15 DIAGNOSIS — R29810 Facial weakness: Secondary | ICD-10-CM | POA: Diagnosis present

## 2021-03-15 DIAGNOSIS — I081 Rheumatic disorders of both mitral and tricuspid valves: Secondary | ICD-10-CM | POA: Diagnosis not present

## 2021-03-15 DIAGNOSIS — R4701 Aphasia: Secondary | ICD-10-CM | POA: Diagnosis not present

## 2021-03-15 DIAGNOSIS — I340 Nonrheumatic mitral (valve) insufficiency: Secondary | ICD-10-CM | POA: Diagnosis present

## 2021-03-15 DIAGNOSIS — Z833 Family history of diabetes mellitus: Secondary | ICD-10-CM

## 2021-03-15 DIAGNOSIS — I6623 Occlusion and stenosis of bilateral posterior cerebral arteries: Secondary | ICD-10-CM | POA: Diagnosis not present

## 2021-03-15 DIAGNOSIS — E785 Hyperlipidemia, unspecified: Secondary | ICD-10-CM | POA: Diagnosis present

## 2021-03-15 DIAGNOSIS — I255 Ischemic cardiomyopathy: Secondary | ICD-10-CM | POA: Diagnosis not present

## 2021-03-15 DIAGNOSIS — Z7902 Long term (current) use of antithrombotics/antiplatelets: Secondary | ICD-10-CM | POA: Diagnosis not present

## 2021-03-15 DIAGNOSIS — R410 Disorientation, unspecified: Secondary | ICD-10-CM | POA: Diagnosis not present

## 2021-03-15 DIAGNOSIS — R29706 NIHSS score 6: Secondary | ICD-10-CM | POA: Diagnosis not present

## 2021-03-15 DIAGNOSIS — Z743 Need for continuous supervision: Secondary | ICD-10-CM | POA: Diagnosis not present

## 2021-03-15 DIAGNOSIS — R531 Weakness: Secondary | ICD-10-CM | POA: Diagnosis not present

## 2021-03-15 LAB — URINALYSIS, COMPLETE (UACMP) WITH MICROSCOPIC
Bacteria, UA: NONE SEEN
Bilirubin Urine: NEGATIVE
Glucose, UA: NEGATIVE mg/dL
Hgb urine dipstick: NEGATIVE
Ketones, ur: NEGATIVE mg/dL
Leukocytes,Ua: NEGATIVE
Nitrite: NEGATIVE
Protein, ur: NEGATIVE mg/dL
Specific Gravity, Urine: 1.021 (ref 1.005–1.030)
pH: 6 (ref 5.0–8.0)

## 2021-03-15 LAB — CBC
HCT: 43 % (ref 36.0–46.0)
Hemoglobin: 13.5 g/dL (ref 12.0–15.0)
MCH: 27 pg (ref 26.0–34.0)
MCHC: 31.4 g/dL (ref 30.0–36.0)
MCV: 86 fL (ref 80.0–100.0)
Platelets: 203 10*3/uL (ref 150–400)
RBC: 5 MIL/uL (ref 3.87–5.11)
RDW: 14.6 % (ref 11.5–15.5)
WBC: 6.6 10*3/uL (ref 4.0–10.5)
nRBC: 0 % (ref 0.0–0.2)

## 2021-03-15 LAB — I-STAT CHEM 8, ED
BUN: 14 mg/dL (ref 8–23)
Calcium, Ion: 1.12 mmol/L — ABNORMAL LOW (ref 1.15–1.40)
Chloride: 108 mmol/L (ref 98–111)
Creatinine, Ser: 0.9 mg/dL (ref 0.44–1.00)
Glucose, Bld: 92 mg/dL (ref 70–99)
HCT: 42 % (ref 36.0–46.0)
Hemoglobin: 14.3 g/dL (ref 12.0–15.0)
Potassium: 3.9 mmol/L (ref 3.5–5.1)
Sodium: 138 mmol/L (ref 135–145)
TCO2: 21 mmol/L — ABNORMAL LOW (ref 22–32)

## 2021-03-15 LAB — DIFFERENTIAL
Abs Immature Granulocytes: 0.02 10*3/uL (ref 0.00–0.07)
Basophils Absolute: 0 10*3/uL (ref 0.0–0.1)
Basophils Relative: 1 %
Eosinophils Absolute: 0 10*3/uL (ref 0.0–0.5)
Eosinophils Relative: 1 %
Immature Granulocytes: 0 %
Lymphocytes Relative: 40 %
Lymphs Abs: 2.7 10*3/uL (ref 0.7–4.0)
Monocytes Absolute: 0.6 10*3/uL (ref 0.1–1.0)
Monocytes Relative: 9 %
Neutro Abs: 3.3 10*3/uL (ref 1.7–7.7)
Neutrophils Relative %: 49 %

## 2021-03-15 LAB — GLUCOSE, CAPILLARY
Glucose-Capillary: 99 mg/dL (ref 70–99)
Glucose-Capillary: 128 mg/dL — ABNORMAL HIGH (ref 70–99)

## 2021-03-15 LAB — COMPREHENSIVE METABOLIC PANEL
ALT: 17 U/L (ref 0–44)
AST: 23 U/L (ref 15–41)
Albumin: 3.7 g/dL (ref 3.5–5.0)
Alkaline Phosphatase: 35 U/L — ABNORMAL LOW (ref 38–126)
Anion gap: 9 (ref 5–15)
BUN: 13 mg/dL (ref 8–23)
CO2: 21 mmol/L — ABNORMAL LOW (ref 22–32)
Calcium: 9.2 mg/dL (ref 8.9–10.3)
Chloride: 107 mmol/L (ref 98–111)
Creatinine, Ser: 1.04 mg/dL — ABNORMAL HIGH (ref 0.44–1.00)
GFR, Estimated: 59 mL/min — ABNORMAL LOW (ref >60)
Glucose, Bld: 97 mg/dL (ref 70–99)
Potassium: 4 mmol/L (ref 3.5–5.1)
Sodium: 137 mmol/L (ref 135–145)
Total Bilirubin: 0.6 mg/dL (ref 0.3–1.2)
Total Protein: 6.4 g/dL — ABNORMAL LOW (ref 6.5–8.1)

## 2021-03-15 LAB — PROTIME-INR
INR: 0.9 (ref 0.8–1.2)
Prothrombin Time: 12.6 seconds (ref 11.4–15.2)

## 2021-03-15 LAB — CBG MONITORING, ED: Glucose-Capillary: 103 mg/dL — ABNORMAL HIGH (ref 70–99)

## 2021-03-15 LAB — RAPID URINE DRUG SCREEN, HOSP PERFORMED
Amphetamines: NOT DETECTED
Barbiturates: NOT DETECTED
Benzodiazepines: NOT DETECTED
Cocaine: NOT DETECTED
Opiates: NOT DETECTED
Tetrahydrocannabinol: POSITIVE — AB

## 2021-03-15 LAB — APTT: aPTT: 24 seconds (ref 24–36)

## 2021-03-15 LAB — SARS CORONAVIRUS 2 (TAT 6-24 HRS): SARS Coronavirus 2: NEGATIVE

## 2021-03-15 MED ORDER — INSULIN ASPART 100 UNIT/ML IJ SOLN
0.0000 [IU] | INTRAMUSCULAR | Status: DC
  Administered 2021-03-16 (×2): 1 [IU] via SUBCUTANEOUS
  Administered 2021-03-17: 2 [IU] via SUBCUTANEOUS
  Administered 2021-03-17: 1 [IU] via SUBCUTANEOUS

## 2021-03-15 MED ORDER — ALTEPLASE (STROKE) FULL DOSE INFUSION
0.9000 mg/kg | Freq: Once | INTRAVENOUS | Status: AC
  Administered 2021-03-15: 52.9 mg via INTRAVENOUS
  Filled 2021-03-15: qty 100

## 2021-03-15 MED ORDER — BUDESONIDE 0.25 MG/2ML IN SUSP
0.2500 mg | Freq: Two times a day (BID) | RESPIRATORY_TRACT | Status: DC
  Administered 2021-03-15 – 2021-03-19 (×8): 0.25 mg via RESPIRATORY_TRACT
  Filled 2021-03-15 (×8): qty 2

## 2021-03-15 MED ORDER — SENNOSIDES-DOCUSATE SODIUM 8.6-50 MG PO TABS: 1.0000 | ORAL_TABLET | Freq: Every evening | ORAL | Status: DC | PRN

## 2021-03-15 MED ORDER — IOHEXOL 350 MG/ML SOLN
75.0000 mL | Freq: Once | INTRAVENOUS | Status: AC | PRN
  Administered 2021-03-15: 75 mL via INTRAVENOUS

## 2021-03-15 MED ORDER — ACETAMINOPHEN 325 MG PO TABS: 650.0000 mg | ORAL_TABLET | ORAL | Status: DC | PRN

## 2021-03-15 MED ORDER — ALTEPLASE (STROKE) FULL DOSE INFUSION
58.8000 mg | Freq: Once | INTRAVENOUS | Status: DC
  Filled 2021-03-15: qty 100

## 2021-03-15 MED ORDER — LABETALOL HCL 5 MG/ML IV SOLN
10.0000 mg | INTRAVENOUS | Status: DC | PRN
  Administered 2021-03-15: 10 mg via INTRAVENOUS

## 2021-03-15 MED ORDER — SODIUM CHLORIDE 0.9% FLUSH: 3.0000 mL | Freq: Once | INTRAVENOUS | Status: DC

## 2021-03-15 MED ORDER — STROKE: EARLY STAGES OF RECOVERY BOOK
Freq: Once | Status: AC
  Filled 2021-03-15: qty 1

## 2021-03-15 MED ORDER — ACETAMINOPHEN 160 MG/5ML PO SOLN: 650.0000 mg | ORAL | Status: DC | PRN

## 2021-03-15 MED ORDER — VITAMIN D 25 MCG (1000 UNIT) PO TABS
1000.0000 [IU] | ORAL_TABLET | Freq: Every day | ORAL | Status: DC
  Administered 2021-03-16 – 2021-03-19 (×4): 1000 [IU] via ORAL
  Filled 2021-03-15 (×5): qty 1

## 2021-03-15 MED ORDER — LABETALOL HCL 5 MG/ML IV SOLN
INTRAVENOUS | Status: AC
  Filled 2021-03-15: qty 4

## 2021-03-15 MED ORDER — SODIUM CHLORIDE 0.9 % IV SOLN: INTRAVENOUS | Status: DC

## 2021-03-15 MED ORDER — SODIUM CHLORIDE 0.9 % IV SOLN
50.0000 mL | Freq: Once | INTRAVENOUS | Status: AC
  Administered 2021-03-15: 50 mL via INTRAVENOUS

## 2021-03-15 MED ORDER — PANTOPRAZOLE SODIUM 40 MG IV SOLR
40.0000 mg | Freq: Every day | INTRAVENOUS | Status: DC
  Administered 2021-03-15: 40 mg via INTRAVENOUS
  Filled 2021-03-15: qty 40

## 2021-03-15 MED ORDER — ASCORBIC ACID 500 MG PO TABS
500.0000 mg | ORAL_TABLET | Freq: Every day | ORAL | Status: DC
  Administered 2021-03-16 – 2021-03-19 (×4): 500 mg via ORAL
  Filled 2021-03-15 (×4): qty 1

## 2021-03-15 MED ORDER — CLEVIDIPINE BUTYRATE 0.5 MG/ML IV EMUL: 0.0000 mg/h | INTRAVENOUS | Status: DC

## 2021-03-15 MED ORDER — ACETAMINOPHEN 650 MG RE SUPP: 650.0000 mg | RECTAL | Status: DC | PRN

## 2021-03-15 MED ORDER — FLUTICASONE PROPIONATE HFA 110 MCG/ACT IN AERO: 1.0000 | INHALATION_SPRAY | Freq: Every day | RESPIRATORY_TRACT | Status: DC

## 2021-03-15 MED ORDER — ALBUTEROL SULFATE HFA 108 (90 BASE) MCG/ACT IN AERS
2.0000 | INHALATION_SPRAY | Freq: Four times a day (QID) | RESPIRATORY_TRACT | Status: DC | PRN
Start: 2021-03-15 — End: 2021-03-19
  Filled 2021-03-15: qty 6.7

## 2021-03-15 MED ORDER — ATORVASTATIN CALCIUM 40 MG PO TABS
40.0000 mg | ORAL_TABLET | Freq: Every day | ORAL | Status: DC
  Administered 2021-03-16 – 2021-03-19 (×4): 40 mg via ORAL
  Filled 2021-03-15 (×4): qty 1

## 2021-03-15 MED ORDER — CHLORHEXIDINE GLUCONATE CLOTH 2 % EX PADS
6.0000 | MEDICATED_PAD | Freq: Every day | CUTANEOUS | Status: DC
  Administered 2021-03-15 – 2021-03-19 (×5): 6 via TOPICAL

## 2021-03-15 NOTE — Progress Notes (Signed)
PHARMACIST CODE STROKE RESPONSE  Notified to mix tPA at 14:19 by Dr. Selina Cooley Delivered tPA to RN at 14:22  tPA dose = 5.3 mg bolus over 1 minute followed by 47.6 mg for a total dose of 52.9 mg over 1 hour  Issues/delays encountered (if applicable): none  Trixie Rude, PharmD PGY1 Acute Care Pharmacy Resident 03/15/2021 3:43 PM  Please check [email or web address] for unit-specific pharmacy phone numbers.

## 2021-03-15 NOTE — H&P (Signed)
Neurology History and Physical Examination  Reason for Consult: Expressive aphasia, right mouth droop Referring Physician: Dr. Ashok Cordia  CC: Expressive aphasia, right mouth droop  History is obtained from: Chart review, EMS  HPI: Leah Pierce is a 70 y.o. female with a medical history significant for hyperlipidemia, prediabetes, and asthma who presented to the ED today for evaluation of acute onset of aphasia while at work. Co-workers noted that Leah Pierce seemed confused and activated EMS after approximately 10 minutes of onset. On arrival, they noted that Leah Pierce was aphasic and had mild right-sided mouth drooping and activated a stroke alert for transport to [employer].   Examination on arrival revealed Leah Pierce with severe expressive aphasia, mild right mouth droop, and right lower extremity drift on assessment with NIHSS = 6. CTH NAICP. Comprehension was not impaired and Leah Pierce was able to appropriately answer very extensive yes or no questioning and was thus able to confirm that she was not on anticoagulation, met inclusion criteria for tPA, did not meet exclusion criteria for tPA, and give informed consent for administration after discussion of risk/benefits. CTA revealed left ICA terminus occlusion extending to the proximal left A1/ACA and M1/MCA. Leah Pierce was reassessed for evaluation of possible IR intervention / treatment of occlusion but on reassessment, Leah Pierce neurologic status much improved and NIHSS improved from 6 to 1 (residual mild right mouth droop) therefore acute intervention was not performed. Due to CTA results, Leah Pierce needing frequent neuro checks with low threshold for intervention and therefore does not qualify for low-intensity monitoring at this time.   LKW: 13:00 tpa given?: yes; 14:22 IR Thrombectomy? No, initially without significant right hemiparesis and rapid improvement with tPA administration Modified Rankin Scale: 0-Completely asymptomatic and back to  baseline post- stroke  ROS: A complete ROS was performed and is negative except as noted in the HPI.   Past Medical History:  Diagnosis Date  . Allergy   . Arthritis   . Asthma    hx of  . Chicken pox   . Heart murmur   . Migraines    Family History  Problem Relation Age of Onset  . Arthritis Mother   . Stroke Mother   . Hypertension Mother   . Diabetes Mother   . Colon polyps Mother   . Arthritis Father   . Cancer Father        Prostate  . Cancer Brother        prostate  . Cancer Brother        Pancreas  . Colon cancer Neg Hx   . Esophageal cancer Neg Hx   . Rectal cancer Neg Hx   . Stomach cancer Neg Hx    Past Surgical History:  Procedure Laterality Date  . CESAREAN SECTION    . COLONOSCOPY    . TONSILLECTOMY  1959   Social History:   reports that she has never smoked. She has never used smokeless tobacco. She reports current alcohol use. She reports current drug use. Frequency: 2.00 times per week. Drug: Marijuana.  Medications  Current Facility-Administered Medications:  .   stroke: mapping our early stages of recovery book, , Does not apply, Once, Toberman, Stevi W, NP .  0.9 %  sodium chloride infusion, , Intravenous, Continuous, Toberman, Stevi W, NP .  acetaminophen (TYLENOL) tablet 650 mg, 650 mg, Oral, Q4H PRN **OR** acetaminophen (TYLENOL) 160 MG/5ML solution 650 mg, 650 mg, Per Tube, Q4H PRN **OR** acetaminophen (TYLENOL) suppository 650 mg, 650 mg, Rectal, Q4H PRN, Toberman,  Roney Mans, NP .  alteplase (ACTIVASE) 1 mg/mL infusion 58.8 mg, 58.8 mg, Intravenous, Once, Derek Jack, MD, Last Rate: 58.8 mL/hr at 03/15/21 1422, 58.8 mg at 03/15/21 1422 .  pantoprazole (PROTONIX) injection 40 mg, 40 mg, Intravenous, QHS, Toberman, Stevi W, NP .  senna-docusate (Senokot-S) tablet 1 tablet, 1 tablet, Oral, QHS PRN, Rikki Spearing, NP .  sodium chloride flush (NS) 0.9 % injection 3 mL, 3 mL, Intravenous, Once, Lajean Saver, MD  Current Outpatient  Medications:  .  albuterol (VENTOLIN HFA) 108 (90 Base) MCG/ACT inhaler, Inhale 2 puffs into the lungs every 6 (six) hours as needed for wheezing or shortness of breath., Disp: 18 g, Rfl: 3 .  alendronate (FOSAMAX) 70 MG tablet, Take 1 tablet (70 mg total) by mouth every 7 (seven) days. Take with a full glass of water on an empty stomach. (Leah Pierce taking differently: Take 70 mg by mouth every 7 (seven) days. Sunday), Disp: 12 tablet, Rfl: 3 .  cholecalciferol (VITAMIN D3) 25 MCG (1000 UNIT) tablet, Take 1,000 Units by mouth daily., Disp: , Rfl:  .  fluticasone (FLOVENT HFA) 110 MCG/ACT inhaler, Inhale 1 puff into the lungs in the morning and at bedtime., Disp: 1 Inhaler, Rfl: 3 .  Misc Natural Products (OSTEO BI-FLEX ADV JOINT SHIELD) TABS, Take 1 tablet by mouth daily., Disp: , Rfl:  .  OVER THE COUNTER MEDICATION, Take 1 tablet by mouth daily. NEURIVA, Disp: , Rfl:  .  vitamin C (ASCORBIC ACID) 500 MG tablet, Take 500 mg by mouth daily., Disp: , Rfl:  .  vitamin E 200 UNIT capsule, Take 200 Units by mouth daily., Disp: , Rfl:  .  atorvastatin (LIPITOR) 20 MG tablet, Take 1 tablet (20 mg total) by mouth daily. (Leah Pierce not taking: Reported on 03/15/2021), Disp: 90 tablet, Rfl: 3 .  Spacer/Aero-Holding Chambers (AEROCHAMBER PLUS) inhaler, Use as instructed to use with inahaler., Disp: 1 each, Rfl: 1  Exam: Current vital signs: BP (!) 154/104   Pulse (!) 101   Temp 98.2 F (36.8 C) (Oral)   Resp (!) 21   Ht 5' 2"  (1.575 m)   Wt 58.8 kg   SpO2 98%   BMI 23.71 kg/m  Vital signs in last 24 hours: Temp:  [98.2 F (36.8 C)] 98.2 F (36.8 C) (04/29 1444) Pulse Rate:  [87-101] 101 (04/29 1500) Resp:  [18-25] 21 (04/29 1500) BP: (142-154)/(90-104) 154/104 (04/29 1500) SpO2:  [97 %-99 %] 98 % (04/29 1449) Weight:  [58.8 kg] 58.8 kg (04/29 1450)  GENERAL: Awake, alert, in no acute distress Head: Normocephalic and atraumatic, without obvious abnormality EENT: Wears eyeglasses, no OP  obstruction, normal conjunctivae LUNGS: Normal respiratory effort. Non-labored breathing CV: Regular rate on telemetry, extremities well perfused without edema ABDOMEN: Soft, non-tender, non-distended Ext: warm, without obvious deformity  NEURO:  Mental Status: Awake and alert. She is able to nod yes and no appropriately to all orientation questions. Nods "yes" correctly to state, city, year, month, and current president. Indicates "no" correctly and consistently when presented wrong answer choices. Speech/Language: speech is aphasic. When asked how old she is she states "54" when asked the month she states and perseverates on "seventy". She is not dysarthric and there is no neglect noted on examination.  Naming, repetition, and fluency are not intact.  Leah Pierce is unable to provide a clear and coherent history of present illness 2/2 expressive aphasia. Cranial Nerves:  II: PERRL 3 --> 2 mm/brisk. Visual fields full.  III, IV,  VI: EOMI. Lid elevation symmetric and full without ptosis. V: Sensation is intact to light touch and symmetrical to face.  VII: Face is asymmetric with mild right mouth drooping  VIII: Hearing is intact to voice IX, X: Palate elevation is symmetric. Phonation normal.  XI: Normal sternocleidomastoid and trapezius muscle strength. XII: Tongue protrudes midline without fasciculations.   Motor: 5/5 strength present in left upper and lower extremities and right upper extremity without vertical drift. Right lower extremity with 4/5 strength with some vertical drift on assessment. Tone and bulk are normal.  Sensation: Intact to light touch bilaterally in all four extremities. No extinction to DSS present.  Coordination: FTN intact bilaterally. HKS intact bilaterally. No pronator drift.   DTRs: 2+ and symmetric biceps, brachioradialis, and patellae.  Plantar: Toes downgoing bilaterally Gait- deferred  NIHSS: 1a Level of Conscious.: 0 1b LOC Questions: 2 1c LOC Commands:  0 2 Best Gaze: 0 3 Visual: 0 4 Facial Palsy: 1 5a Motor Arm - left: 0 5b Motor Arm - Right: 0 6a Motor Leg - Left: 0 6b Motor Leg - Right: 1 7 Limb Ataxia: 0 8 Sensory: 0 9 Best Language: 2 10 Dysarthria: 0 11 Extinct. and Inatten.: 0 TOTAL: 6  tPA Checklist   tPA Inclusion criteria  [x]  Age > 18 years  [x]  LKW < 4.5 hrs  [x]  New disabling symptoms that are suggestive of a stroke   [x] Viewed by Leah Pierce or family as disabling and interfering with work, hobbies and/or entertainment   [x] Complete Hemianopsia or Moderate to Severe Aphasia.   [] Visual or sensory extinction.   [x] Any weakness limiting sustained effort against gravity.   [x] NIHSS > 5   [] Early improvement but remains moderately impaired and potentially disabled   tPA exclusion criteria  []  LKW > 4.5 hrs  []  Rapidly improving symptoms or resolution of symptoms  []  CTH demonstrates Intracranial Hemorrhage  []  CT exhibits extensive regions (> 1/3 MCA territory on CT) of Clear Hypoattenuation  []  Unable to maintain BP < 185/110 despite aggressive antihypertensive treatment  []  Severe head trauma within last 3 months  []  Gastrointestinal or genitourinary bleeding within last 21 days or structural GI malignancy  []  Active internal bleeding  []  Arterial puncture at non-compressible site within last 7 days  []  Infective endocarditis  []  Suspected Aortic dissection  []  Intracranial intra-axial neoplasm.(intracranial extra-axial neoplasm is not an exclusion)  []  Intracranial or spinal surgery within last 3 months  Medications  []  Full dose LMWH within last 24 hrs  []  Received novel oral anticoagulant within last 48 hours (assuming normal renal function). Commonly prescribed DOACs include Apixaban (Eliquis), Dabigatran (Pradaxa), Rivaroxaban (Xarelto), Edoxaban (Savaysa), Betrixaban (Bevyxxa)  []  Warfarin (Coumadin) within the last 24 hours with INR greater than 1.7 or PT > 15.  Labs  []  Blood glucose < 39m/dL, should  treat if persistent symptoms after glucose normalized.  []  INR > 1.7 (results note required before treatment unless Leah Pierce is on anticoagulant therapy or there is another reason to suspect abnormality)  []   Platelet count < 100,000  []  PT > 15  []  aPTT > 40 secs  SPECIAL CONSIDERATIONS for tPA  []  Mild stroke with non-disabling symptoms or rapidly improving symptoms- tPA not recommended.  []  Pregnancy Discuss risks with Leah Pierce including following: [] Can be considered for moderate to severe stroke when anticipated benefit of treating moderate to severe stroke outweighs the anticipated increased risk of uterine bleeding. [] Alteplase is listed as pregnancy category C, indicating possible embryocidal risk  based on animal experiments at high doses. There are no well controlled studies in humans. [] Animal studies of Alteplase at 77m/Kg did not show fetal toxicity or teratogenic effects. [] Based on molecular weight, tPA is not expected to cross the placenta. [] The most relevant risk of alteplase in pregnancy is related to the risk of uterine bleeding. Known placenta previa or history of obstetric hemorrhage is a high risk. Preeclampsia/Eclampsia/HELLP can predispose to [place]  []  Major surgery within 14 days. The potential increased risk of surgical site hemorrhage should be weighed against potential disability from ischemic stroke related neurological deficit.   Major trauma (with no severe head trauma) within 14 days. The potential risk of bleeding from injuries related to trauma should be weighed against potential disability from ischemic stroke related neurological deficit.  []  Seizure at onset. tPA is reasonable if evidence suggests that residual impairments are secondary to stroke and not a postictal phenomenon. (Class IIa; Level of evidence C)  []  Myocardial infarction within last 3 months.  []  Acute Pericarditis or suspected post MI pericarditis. Risk of pericardial hemorrhage by lytics and  cardiac rupture within necrotic myocardial tissue.  []  Known left atrial or ventricular thrombus.  [] For patients with major acute ischemic stroke likely to produce severe disability and known left atrial or ventrivular thrombus, treatment with tPA maybe reasonable. [] For patients presenting with moderate acute ischemic stroke, likely to produce moderate disability and known left atrial or ventricular thrombus, treatment with IV tPA is of uncertain benefit.  []  Lumbar Puncture within 7 days.  []  Condition or history of bleeding diathesis which would pose significant bleeding risk to Leah Pierce  []  History of Intracranial hemorrhage  []  Cerebral AVM, aneurysms > 158m intracranial neoplasm are associated with high risk of Intracranial Hemorrhage. tPA should be avoided.  []  Blood glucose > 40036mL  []  Wake up stroke, unknown time of onset  []  Blood glucose > 400m59m (hyperglycemia may accelerate tissue infarct after ischemia and decrease the chances of unsuccessful recannulization)  []  History of diabetic hemorrhagic retinopathy or other hemorrhagic ophthalmic conditions. Potential increased risk of vision loss should be weighed against anticipated benefits of reduced stroke related neurologic deficit.  []  Risk of symptomatic ICH in psychogenic population is very low. Starting IV tPA is probably recommended. (Class IIa; Level of evidence B)  []    []    []    []     Labs I have reviewed labs in epic and the results pertinent to this consultation are: CBC    Component Value Date/Time   WBC 6.6 03/15/2021 1404   RBC 5.00 03/15/2021 1404   HGB 14.3 03/15/2021 1411   HCT 42.0 03/15/2021 1411   PLT 203 03/15/2021 1404   MCV 86.0 03/15/2021 1404   MCH 27.0 03/15/2021 1404   MCHC 31.4 03/15/2021 1404   RDW 14.6 03/15/2021 1404   LYMPHSABS 2.7 03/15/2021 1404   MONOABS 0.6 03/15/2021 1404   EOSABS 0.0 03/15/2021 1404   BASOSABS 0.0 03/15/2021 1404   CMP     Component Value Date/Time   NA 138  03/15/2021 1411   K 3.9 03/15/2021 1411   CL 108 03/15/2021 1411   CO2 21 (L) 03/15/2021 1404   GLUCOSE 92 03/15/2021 1411   BUN 14 03/15/2021 1411   CREATININE 0.90 03/15/2021 1411   CALCIUM 9.2 03/15/2021 1404   PROT 6.4 (L) 03/15/2021 1404   ALBUMIN 3.7 03/15/2021 1404   AST 23 03/15/2021 1404   ALT 17 03/15/2021 1404   ALKPHOS 35 (L) 03/15/2021  1404   BILITOT 0.6 03/15/2021 1404   GFRNONAA 59 (L) 03/15/2021 1404   Lipid Panel     Component Value Date/Time   CHOL 164 02/03/2020 1102   TRIG 90.0 02/03/2020 1102   HDL 65.50 02/03/2020 1102   CHOLHDL 3 02/03/2020 1102   VLDL 18.0 02/03/2020 1102   LDLCALC 80 02/03/2020 1102   Imaging I have reviewed the images obtained: CT-scan of the brain: IMPRESSION: No evidence of acute intracranial abnormality.  ASPECTS is 10. Small region of cortical calcification within the right occipital lobe (PCA vascular territory). This is nonspecific, but may reflect dystrophic calcification at site of a chronic infarct. Chronic lacunar infarct within the right thalamus. Mild generalized parenchymal atrophy.  CT angio head and neck: IMPRESSION: 1. Left ICA terminus occlusion extending to the proximal left A1/ACA and M1/MCA ( T occlusion). There is good leptomeningeal collateral flow to the left MCA vascular tree and filling of the left ACA via anterior communicating artery. 2. High cervical right internal carotid artery bifurcation corresponding to origin of a type 1 proatlantal artery. 3. Diminutive intracranial vertebral arteries mostly supplying the bilateral PICA territories. 4. Prominent left palatine tonsil. Correlation with direct exam suggested.  Assessment: 70 year old female with history as above who presents as a Code Stroke with expressive aphasia, right mouth droop, and mild weakness of the right lower extremity.  - Stroke risk factors include hyperlipidemia, previous stroke (identified on CT as chronic lacunar infarct) not on  stroke prophylaxis, age, and prediabetes. - Examination on arrival revealed Leah Pierce with severe expressive aphasia, mild right mouth droop, and right lower extremity drift on assessment.  - CT head was obtained without evidence of acute intracranial abnormality. She was diagnosed with ischemic stroke based on presentation, the tPA checklist was covered with Leah Pierce once she demonstrated that she was oriented by appropriately and consistently answering "yes/no" orientation questions and consenting to tPA treatment, tPA was given, and CTA was obtained.  - CTA revealed left ICA terminus occlusion extending to the proximal left A1/ACA and M1/MCA. Leah Pierce was reassessed for evaluation of possible IR intervention / treatment of occlusion but on reassessment, Leah Pierce neurologic status much improved and NIHSS improved from 6 to 1 (residual mild right mouth droop). Due to CTA results, Leah Pierce needing frequent neuro checks with low threshold for intervention and therefore does not qualify for low-intensity monitoring at this time.   Impression: Acute ischemic stroke determined by clinical assessment   Plan: Acute Ischemic Stroke Cerebral infarction due to embolism of left anterior cerebral artery extending to proximal left ACA and MCA  Acuity: Acute Current Suspected Etiology: Embolism Continue Evaluation:  -Admit to: ICU -Hold Aspirin until 24 hour post tPA neuroimaging (CT head) is stable and without evidence of bleeding -Blood pressure control, goal of < 180 / < 105 -MRI brain and MRA HYN when able (repeat vessel imaging to reassess L ICA terminus occlusion clinically recanalized with tPA) -STAT CT head for any neurologic decline and at 24 hrs post-tPA -ECHO/A1C/Lipid panel. -Hyperglycemia management per SSI to maintain glucose 140-14m/dL. -PT/OT/ST therapies and recommendations when able  CNS Aphasia without dysarthria Right mouth droop -NPO until cleared by speech -ST -Advance diet as  tolerated  RESP History of asthma - Continue home albuterol, fluticasone  CV Mildly hypertensive on ED arrival -Aggressive BP control, goal SBP < 180, DBP goal < 105 for at least 24h s/p tPA - Not on home antihypertensives  Hyperlipidemia, unspecified  - On home atorvastatin 20 mg daily - Statin  for goal LDL < 70  Cardiac telemetry - Consider outpatient 30-day event monitor if no arrhythmia captured on telemetry monitoring  HEME No anemia on initial lab work -Monitor AM CBC -Transfuse for hgb < 7  Risk for bleeding s/p tPA - No non-compressible sticks or tube insertions for 24h unless absolutely necessary - Strict bedrest x24h  ENDO Prediabetes  - Sensitive SSI as needed for goal blood glucose of 140 - 180 -goal HgbA1c < 7%  GI/GU No history of kidney diesase -Gentle hydration  Fluid/Electrolyte Disorders AM BMP -Replete -Repeat labs -Trend  ID No evidence of acute infection at this time  Nutrition Advance diet as tolerated / per ST recommendations once swallow screen is passed  Prophylaxis DVT:  SCDs GI: PPI Bowel: Docusate / Senna  Diet: NPO until cleared by speech  Code Status: Full Code    THE FOLLOWING WERE PRESENT ON ADMISSION: CNS -  Acute Ischemic Stroke Respiratory - History of asthma Cardiovascular - Mildly hypertensive on ED arrival Infectious - N/A GI - N/A Renal -  N/A Heme-  N/A Cancer - N/A Trauma - N/A  Anibal Henderson, AGAC-NP [facility] Pager: [phone]  Note was written by Anibal Henderson NP and edited by me to reflect my findings and recommendations. I was present throughout the stroke code and made all decisions including administration of tPA and decision not to proceed with IR based on clinical improvement. Also d/w Dr. Corrie Mckusick of neuro IR who is in agreement.  This Leah Pierce is critically ill and at significant risk of neurological worsening, death and care requires constant monitoring of vital  signs, hemodynamics,respiratory and cardiac monitoring, neurological assessment, discussion with family, other specialists and medical decision making of high complexity. I spent 90 minutes of neurocritical care time  in the care of  this Leah Pierce. This was time spent independent of any time provided by nurse practitioner or PA.  Su Monks, MD [facility] [phone]  If 7pm- 7am, please page neurology on call as listed in [place].

## 2021-03-15 NOTE — Code Documentation (Signed)
Pt is a 70 yr old female who had a sudden inability to speak at work today at 1300. Her supervisor called EMS, who activated a code stroke at 1353. Pt arrived [facility] at 1405. Blood work, CBG (103) done and EDP cleared pt at bridge. Pt taken to CT scan at 1407. (See flowsheet for complete times and full NIHSS). CTNC negative for acute hemorrhage per Dr Selina Cooley. Pt had a severe expressive aphasia and slight rt facial droop and slight rt leg drift. Pt did not have any contraindications for TPA.  TPA 52.9mg  given at 1422 as a 5.3mg  bolus followed by a 47.6mg  infusion over one hr. By 9449, her speech was fluent again. Per neurologist, pt's CTA was positive for an LVO, but as pt's exam was so rapidly improving, it was presumed that the TPA had dissolved the clot. Pt will be admitted to ICU and monitored closely for any neuro worsening as she is well within treatment window for NIR. SRN stayed with pt for one hour, at which time post TPA flush was started. NIHSS 1 at 1530. Bedside handoff with [facility] RN done. Pt will need continued q 15 min VS and mNIHSS until 1630, at which time she will need q 30 min VS and mNIHSS for 6 hr, then q 1 hr for 16 hrs.

## 2021-03-15 NOTE — ED Provider Notes (Addendum)
[facility] EMERGENCY DEPARTMENT Provider Note   CSN: [id number] Arrival date & time: 03/15/21  1401  An emergency department physician performed an initial assessment on this suspected stroke patient at 1403.  History Chief Complaint  Patient presents with  . Code Stroke    Leah Pierce is a 70 y.o. female.  Patient arrives as code stroke activation. Was last at baseline at/around 1300 today, and was noted to have acute onset right sided weakness and aphasia. Symptoms acute onset, moderate, constant, persistent. No headache. No chest pain. No fever or chills. Arrives as code cva activation - neurology/stroke team eval on arrival - taken emergently to CT - level 5 caveat.   The history is provided by the patient and the EMS personnel. The history is limited by the condition of the patient.       Past Medical History:  Diagnosis Date  . Allergy   . Arthritis   . Asthma    hx of  . Chicken pox   . Heart murmur   . Migraines     Patient Active Problem List   Diagnosis Date Noted  . Stroke determined by clinical assessment ([place]) 03/15/2021  . Hyperlipidemia 08/08/2020  . Prediabetes 08/08/2020  . Osteoporosis 06/10/2018  . Allergic rhinitis 09/23/2017  . Asthma, intermittent, uncomplicated 65/46/5035    Past Surgical History:  Procedure Laterality Date  . CESAREAN SECTION    . COLONOSCOPY    . TONSILLECTOMY  1959     OB History   No obstetric history on file.     Family History  Problem Relation Age of Onset  . Arthritis Mother   . Stroke Mother   . Hypertension Mother   . Diabetes Mother   . Colon polyps Mother   . Arthritis Father   . Cancer Father        Prostate  . Cancer Brother        prostate  . Cancer Brother        Pancreas  . Colon cancer Neg Hx   . Esophageal cancer Neg Hx   . Rectal cancer Neg Hx   . Stomach cancer Neg Hx     Social History   Tobacco Use  . Smoking status: Never Smoker  . Smokeless  tobacco: Never Used  Vaping Use  . Vaping Use: Never used  Substance Use Topics  . Alcohol use: Yes    Comment: occasionally  . Drug use: Yes    Frequency: 2.0 times per week    Types: Marijuana    Home Medications Prior to Admission medications   Medication Sig Start Date End Date Taking? Authorizing Provider  albuterol (VENTOLIN HFA) 108 (90 Base) MCG/ACT inhaler Inhale 2 puffs into the lungs every 6 (six) hours as needed for wheezing or shortness of breath. 02/06/21  Yes [place], Betty G, MD  alendronate (FOSAMAX) 70 MG tablet Take 1 tablet (70 mg total) by mouth every 7 (seven) days. Take with a full glass of water on an empty stomach. Patient taking differently: Take 70 mg by mouth every 7 (seven) days. Sunday 08/11/20  Yes [place], Betty G, MD  cholecalciferol (VITAMIN D3) 25 MCG (1000 UNIT) tablet Take 1,000 Units by mouth daily.   Yes [provider]  fluticasone (FLOVENT HFA) 110 MCG/ACT inhaler Inhale 1 puff into the lungs in the morning and at bedtime. 05/04/20  Yes [place], Betty G, MD  Misc Natural Products (OSTEO BI-FLEX ADV JOINT SHIELD) TABS Take 1 tablet  by mouth daily.   Yes [provider]  OVER THE COUNTER MEDICATION Take 1 tablet by mouth daily. NEURIVA   Yes [provider]  vitamin C (ASCORBIC ACID) 500 MG tablet Take 500 mg by mouth daily.   Yes [provider]  vitamin E 200 UNIT capsule Take 200 Units by mouth daily.   Yes [provider]  atorvastatin (LIPITOR) 20 MG tablet Take 1 tablet (20 mg total) by mouth daily. Patient not taking: Reported on 03/15/2021 08/11/20   [place], Betty G, MD  Spacer/Aero-Holding Chambers (AEROCHAMBER PLUS) inhaler Use as instructed to use with inahaler. 05/04/20   [place], Betty G, MD    Allergies    Tetracyclines & related  Review of Systems   Review of Systems  Constitutional: Negative for fever.  HENT: Negative for trouble swallowing.   Eyes: Negative for visual disturbance.   Respiratory: Negative for shortness of breath.   Cardiovascular: Negative for chest pain.  Gastrointestinal: Negative for abdominal pain and vomiting.  Genitourinary: Negative for flank pain.  Musculoskeletal: Negative for neck pain and neck stiffness.  Skin: Negative for rash.  Neurological: Positive for speech difficulty and weakness.  Hematological: Does not bruise/bleed easily.  Psychiatric/Behavioral: Negative for confusion.    Physical Exam Updated Vital Signs BP (!) 148/94   Pulse 99   Temp 98.2 F (36.8 C) (Oral)   Resp 20   Ht 1.575 m (5' 2" )   Wt 58.8 kg   SpO2 96%   BMI 23.71 kg/m   Physical Exam Vitals and nursing note reviewed.  Constitutional:      Appearance: Normal appearance. She is well-developed.  HENT:     Head: Atraumatic.     Nose: Nose normal.     Mouth/Throat:     Mouth: Mucous membranes are moist.  Eyes:     General: No scleral icterus.    Conjunctiva/sclera: Conjunctivae normal.  Neck:     Trachea: No tracheal deviation.  Cardiovascular:     Rate and Rhythm: Normal rate and regular rhythm.     Pulses: Normal pulses.     Heart sounds: Normal heart sounds. No murmur heard. No friction rub. No gallop.   Pulmonary:     Effort: Pulmonary effort is normal. No respiratory distress.     Breath sounds: Normal breath sounds.  Abdominal:     General: There is no distension.     Palpations: Abdomen is soft.     Tenderness: There is no abdominal tenderness.  Genitourinary:    Comments: No cva tenderness.  Musculoskeletal:        General: No swelling.     Cervical back: Normal range of motion and neck supple. No rigidity. No muscular tenderness.  Skin:    General: Skin is warm and dry.     Findings: No rash.  Neurological:     Mental Status: She is alert.     Comments: Alert. Expressive aphasia. Right sided weakness.   Psychiatric:        Mood and Affect: Mood normal.     ED Results / Procedures / Treatments   Labs (all labs ordered  are listed, but only abnormal results are displayed) Results for orders placed or performed during the hospital encounter of 03/15/21  Protime-INR  Result Value Ref Range   Prothrombin Time 12.6 11.4 - 15.2 seconds   INR 0.9 0.8 - 1.2  APTT  Result Value Ref Range   aPTT 24 24 - 36 seconds  CBC  Result Value Ref Range   WBC 6.6 4.0 - 10.5 K/uL   RBC 5.00 3.87 - 5.11 MIL/uL   Hemoglobin 13.5 12.0 - 15.0 g/dL   HCT 43.0 36.0 - 46.0 %   MCV 86.0 80.0 - 100.0 fL   MCH 27.0 26.0 - 34.0 pg   MCHC 31.4 30.0 - 36.0 g/dL   RDW 14.6 11.5 - 15.5 %   Platelets 203 150 - 400 K/uL   nRBC 0.0 0.0 - 0.2 %  Differential  Result Value Ref Range   Neutrophils Relative % 49 %   Neutro Abs 3.3 1.7 - 7.7 K/uL   Lymphocytes Relative 40 %   Lymphs Abs 2.7 0.7 - 4.0 K/uL   Monocytes Relative 9 %   Monocytes Absolute 0.6 0.1 - 1.0 K/uL   Eosinophils Relative 1 %   Eosinophils Absolute 0.0 0.0 - 0.5 K/uL   Basophils Relative 1 %   Basophils Absolute 0.0 0.0 - 0.1 K/uL   Immature Granulocytes 0 %   Abs Immature Granulocytes 0.02 0.00 - 0.07 K/uL  Comprehensive metabolic panel  Result Value Ref Range   Sodium 137 135 - 145 mmol/L   Potassium 4.0 3.5 - 5.1 mmol/L   Chloride 107 98 - 111 mmol/L   CO2 21 (L) 22 - 32 mmol/L   Glucose, Bld 97 70 - 99 mg/dL   BUN 13 8 - 23 mg/dL   Creatinine, Ser 1.04 (H) 0.44 - 1.00 mg/dL   Calcium 9.2 8.9 - 10.3 mg/dL   Total Protein 6.4 (L) 6.5 - 8.1 g/dL   Albumin 3.7 3.5 - 5.0 g/dL   AST 23 15 - 41 U/L   ALT 17 0 - 44 U/L   Alkaline Phosphatase 35 (L) 38 - 126 U/L   Total Bilirubin 0.6 0.3 - 1.2 mg/dL   GFR, Estimated 59 (L) >60 mL/min   Anion gap 9 5 - 15  I-stat chem 8, ED  Result Value Ref Range   Sodium 138 135 - 145 mmol/L   Potassium 3.9 3.5 - 5.1 mmol/L   Chloride 108 98 - 111 mmol/L   BUN 14 8 - 23 mg/dL   Creatinine, Ser 0.90 0.44 - 1.00 mg/dL   Glucose, Bld 92 70 - 99 mg/dL   Calcium, Ion 1.12 (L) 1.15 - 1.40 mmol/L   TCO2 21 (L) 22 - 32  mmol/L   Hemoglobin 14.3 12.0 - 15.0 g/dL   HCT 42.0 36.0 - 46.0 %  CBG monitoring, ED  Result Value Ref Range   Glucose-Capillary 103 (H) 70 - 99 mg/dL   Comment 1 Notify RN    Comment 2 Document in Chart    CT HEAD CODE STROKE WO CONTRAST  Result Date: 03/15/2021 CLINICAL DATA:  Code stroke. Neuro deficit, acute, stroke suspected. Additional provided: Right facial droop, aphasia, stroke alert. EXAM: CT HEAD WITHOUT CONTRAST TECHNIQUE: Contiguous axial images were obtained from the base of the skull through the vertex without intravenous contrast. COMPARISON:  No pertinent prior exams available for comparison. FINDINGS: Brain: Mild cerebral and cerebellar atrophy. Chronic appearing lacunar infarct within the right thalamus (series 2, image 16) (series 5, image 36) (series 6, image 23). Small region of cortical calcification within the right occipital lobe. There is no acute intracranial hemorrhage. No acute demarcated cortical infarct. No extra-axial fluid collection. No evidence of intracranial mass. No midline shift. Vascular: No hyperdense vessel.  Atherosclerotic calcifications. Skull: Normal. Negative for fracture or focal lesion. Sinuses/Orbits: Visualized orbits show no  acute finding. Trace bilateral ethmoid sinus mucosal thickening at the imaged levels. ASPECTS ([place] Stroke Program Early CT Score) - Ganglionic level infarction (caudate, lentiform nuclei, internal capsule, insula, M1-M3 cortex): 7 - Supraganglionic infarction (M4-M6 cortex): 3 Total score (0-10 with 10 being normal): 10 These results were communicated to Dr. Quinn Axe At 2:20 pmon 4/29/2022by text page via the [facility] messaging system. IMPRESSION: No evidence of acute intracranial abnormality.  ASPECTS is 10. Small region of cortical calcification within the right occipital lobe (PCA vascular territory). This is nonspecific, but may reflect dystrophic calcification at site of a chronic infarct. Chronic lacunar infarct within the  right thalamus. Mild generalized parenchymal atrophy. Electronically Signed   By: Kellie Simmering DO   On: 03/15/2021 14:24   CT ANGIO HEAD CODE STROKE  Result Date: 03/15/2021 CLINICAL DATA:  Stroke.  Right facial droop, aphasia. EXAM: CT ANGIOGRAPHY HEAD AND NECK TECHNIQUE: Multidetector CT imaging of the head and neck was performed using the standard protocol during bolus administration of intravenous contrast. Multiplanar CT image reconstructions and MIPs were obtained to evaluate the vascular anatomy. Carotid stenosis measurements (when applicable) are obtained utilizing NASCET criteria, using the distal internal carotid diameter as the denominator. CONTRAST:  3m OMNIPAQUE IOHEXOL 350 MG/ML SOLN COMPARISON:  None. FINDINGS: CTA NECK FINDINGS Aortic arch: Short common origin of the innominate and left common carotid artery from the aortic arch. Imaged portion shows no evidence of aneurysm or dissection. No significant stenosis of the major arch vessel origins. Right carotid system: Mild atherosclerotic changes of the right carotid bifurcation without hemodynamically significant stenosis. High cervical internal carotid artery bifurcation corresponding to a type 1 proatlantal artery. Left carotid system: Mild atherosclerotic changes of the left carotid bifurcation without hemodynamically significant stenosis. Decrease caliber of the cervical left ICA related to intracranial occlusion. Vertebral arteries: Related to the presence of a right pro atlantal artery. No focal stenosis. Skeleton: Negative. Other neck: Prominent left palatine tonsil. Correlation with direct exam suggested. Upper chest: No acute findings. Review of the MIP images confirms the above findings CTA HEAD FINDINGS Anterior circulation: There is a left ICA terminus occlusion extending to the proximal left A1/ACA and M1/MCA ( "T" occlusion). There is good leptomeningeal collateral flow to the left MCA vascular tree and filling of the left ACA via  anterior communicating artery. Atherosclerotic changes of the right carotid siphon without hemodynamically significant stenosis. The right ACA and MCA vascular trees are patent. Posterior circulation: Diminutive intracranial vertebral arteries mostly supplying the bilateral PICA territories. Right proatlantal artery with normal caliber. The basilar artery and bilateral posterior cerebral arteries are maintained. Venous sinuses: As permitted by contrast timing, patent. Anatomic variants: Type 1 right proatlantal artery. Review of the MIP images confirms the above findings IMPRESSION: 1. Left ICA terminus occlusion extending to the proximal left A1/ACA and M1/MCA ( T occlusion). There is good leptomeningeal collateral flow to the left MCA vascular tree and filling of the left ACA via anterior communicating artery. 2. High cervical right internal carotid artery bifurcation corresponding to origin of a type 1 proatlantal artery. 3. Diminutive intracranial vertebral arteries mostly supplying the bilateral PICA territories. 4. Prominent left palatine tonsil. Correlation with direct exam suggested. These results were called by telephone at the time of interpretation on 03/15/2021 at 2:50 pm to provider Dr. CSu Monks who verbally acknowledged these results. Electronically Signed   By: KPedro EarlsM.D.   On: 03/15/2021 15:07   CT ANGIO NECK CODE STROKE  Result Date:  03/15/2021 CLINICAL DATA:  Stroke.  Right facial droop, aphasia. EXAM: CT ANGIOGRAPHY HEAD AND NECK TECHNIQUE: Multidetector CT imaging of the head and neck was performed using the standard protocol during bolus administration of intravenous contrast. Multiplanar CT image reconstructions and MIPs were obtained to evaluate the vascular anatomy. Carotid stenosis measurements (when applicable) are obtained utilizing NASCET criteria, using the distal internal carotid diameter as the denominator. CONTRAST:  109m OMNIPAQUE IOHEXOL 350 MG/ML  SOLN COMPARISON:  None. FINDINGS: CTA NECK FINDINGS Aortic arch: Short common origin of the innominate and left common carotid artery from the aortic arch. Imaged portion shows no evidence of aneurysm or dissection. No significant stenosis of the major arch vessel origins. Right carotid system: Mild atherosclerotic changes of the right carotid bifurcation without hemodynamically significant stenosis. High cervical internal carotid artery bifurcation corresponding to a type 1 proatlantal artery. Left carotid system: Mild atherosclerotic changes of the left carotid bifurcation without hemodynamically significant stenosis. Decrease caliber of the cervical left ICA related to intracranial occlusion. Vertebral arteries: Related to the presence of a right pro atlantal artery. No focal stenosis. Skeleton: Negative. Other neck: Prominent left palatine tonsil. Correlation with direct exam suggested. Upper chest: No acute findings. Review of the MIP images confirms the above findings CTA HEAD FINDINGS Anterior circulation: There is a left ICA terminus occlusion extending to the proximal left A1/ACA and M1/MCA ( "T" occlusion). There is good leptomeningeal collateral flow to the left MCA vascular tree and filling of the left ACA via anterior communicating artery. Atherosclerotic changes of the right carotid siphon without hemodynamically significant stenosis. The right ACA and MCA vascular trees are patent. Posterior circulation: Diminutive intracranial vertebral arteries mostly supplying the bilateral PICA territories. Right proatlantal artery with normal caliber. The basilar artery and bilateral posterior cerebral arteries are maintained. Venous sinuses: As permitted by contrast timing, patent. Anatomic variants: Type 1 right proatlantal artery. Review of the MIP images confirms the above findings IMPRESSION: 1. Left ICA terminus occlusion extending to the proximal left A1/ACA and M1/MCA ( T occlusion). There is good  leptomeningeal collateral flow to the left MCA vascular tree and filling of the left ACA via anterior communicating artery. 2. High cervical right internal carotid artery bifurcation corresponding to origin of a type 1 proatlantal artery. 3. Diminutive intracranial vertebral arteries mostly supplying the bilateral PICA territories. 4. Prominent left palatine tonsil. Correlation with direct exam suggested. These results were called by telephone at the time of interpretation on 03/15/2021 at 2:50 pm to provider Dr. CSu Monks who verbally acknowledged these results. Electronically Signed   By: KPedro EarlsM.D.   On: 03/15/2021 15:07    EKG EKG Interpretation  Date/Time:  Friday March 15 2021 14:38:31 EDT Ventricular Rate:  93 PR Interval:  173 QRS Duration: 80 QT Interval:  353 QTC Calculation: 439 R Axis:   71 Text Interpretation: Sinus rhythm Atrial premature complex Non-specific ST-t changes No previous tracing Confirmed by SLajean Saver([id number] on 03/15/2021 4:11:49 PM   Radiology CT HEAD CODE STROKE WO CONTRAST  Result Date: 03/15/2021 CLINICAL DATA:  Code stroke. Neuro deficit, acute, stroke suspected. Additional provided: Right facial droop, aphasia, stroke alert. EXAM: CT HEAD WITHOUT CONTRAST TECHNIQUE: Contiguous axial images were obtained from the base of the skull through the vertex without intravenous contrast. COMPARISON:  No pertinent prior exams available for comparison. FINDINGS: Brain: Mild cerebral and cerebellar atrophy. Chronic appearing lacunar infarct within the right thalamus (series 2, image 16) (series 5, image 36) (series  6, image 23). Small region of cortical calcification within the right occipital lobe. There is no acute intracranial hemorrhage. No acute demarcated cortical infarct. No extra-axial fluid collection. No evidence of intracranial mass. No midline shift. Vascular: No hyperdense vessel.  Atherosclerotic calcifications. Skull: Normal.  Negative for fracture or focal lesion. Sinuses/Orbits: Visualized orbits show no acute finding. Trace bilateral ethmoid sinus mucosal thickening at the imaged levels. ASPECTS ([place] Stroke Program Early CT Score) - Ganglionic level infarction (caudate, lentiform nuclei, internal capsule, insula, M1-M3 cortex): 7 - Supraganglionic infarction (M4-M6 cortex): 3 Total score (0-10 with 10 being normal): 10 These results were communicated to Dr. Quinn Axe At 2:20 pmon 4/29/2022by text page via the [facility] messaging system. IMPRESSION: No evidence of acute intracranial abnormality.  ASPECTS is 10. Small region of cortical calcification within the right occipital lobe (PCA vascular territory). This is nonspecific, but may reflect dystrophic calcification at site of a chronic infarct. Chronic lacunar infarct within the right thalamus. Mild generalized parenchymal atrophy. Electronically Signed   By: Kellie Simmering DO   On: 03/15/2021 14:24   CT ANGIO HEAD CODE STROKE  Result Date: 03/15/2021 CLINICAL DATA:  Stroke.  Right facial droop, aphasia. EXAM: CT ANGIOGRAPHY HEAD AND NECK TECHNIQUE: Multidetector CT imaging of the head and neck was performed using the standard protocol during bolus administration of intravenous contrast. Multiplanar CT image reconstructions and MIPs were obtained to evaluate the vascular anatomy. Carotid stenosis measurements (when applicable) are obtained utilizing NASCET criteria, using the distal internal carotid diameter as the denominator. CONTRAST:  85m OMNIPAQUE IOHEXOL 350 MG/ML SOLN COMPARISON:  None. FINDINGS: CTA NECK FINDINGS Aortic arch: Short common origin of the innominate and left common carotid artery from the aortic arch. Imaged portion shows no evidence of aneurysm or dissection. No significant stenosis of the major arch vessel origins. Right carotid system: Mild atherosclerotic changes of the right carotid bifurcation without hemodynamically significant stenosis. High cervical  internal carotid artery bifurcation corresponding to a type 1 proatlantal artery. Left carotid system: Mild atherosclerotic changes of the left carotid bifurcation without hemodynamically significant stenosis. Decrease caliber of the cervical left ICA related to intracranial occlusion. Vertebral arteries: Related to the presence of a right pro atlantal artery. No focal stenosis. Skeleton: Negative. Other neck: Prominent left palatine tonsil. Correlation with direct exam suggested. Upper chest: No acute findings. Review of the MIP images confirms the above findings CTA HEAD FINDINGS Anterior circulation: There is a left ICA terminus occlusion extending to the proximal left A1/ACA and M1/MCA ( "T" occlusion). There is good leptomeningeal collateral flow to the left MCA vascular tree and filling of the left ACA via anterior communicating artery. Atherosclerotic changes of the right carotid siphon without hemodynamically significant stenosis. The right ACA and MCA vascular trees are patent. Posterior circulation: Diminutive intracranial vertebral arteries mostly supplying the bilateral PICA territories. Right proatlantal artery with normal caliber. The basilar artery and bilateral posterior cerebral arteries are maintained. Venous sinuses: As permitted by contrast timing, patent. Anatomic variants: Type 1 right proatlantal artery. Review of the MIP images confirms the above findings IMPRESSION: 1. Left ICA terminus occlusion extending to the proximal left A1/ACA and M1/MCA ( T occlusion). There is good leptomeningeal collateral flow to the left MCA vascular tree and filling of the left ACA via anterior communicating artery. 2. High cervical right internal carotid artery bifurcation corresponding to origin of a type 1 proatlantal artery. 3. Diminutive intracranial vertebral arteries mostly supplying the bilateral PICA territories. 4. Prominent left palatine tonsil. Correlation  with direct exam suggested. These results  were called by telephone at the time of interpretation on 03/15/2021 at 2:50 pm to provider Dr. Su Monks, who verbally acknowledged these results. Electronically Signed   By: Pedro Earls M.D.   On: 03/15/2021 15:07   CT ANGIO NECK CODE STROKE  Result Date: 03/15/2021 CLINICAL DATA:  Stroke.  Right facial droop, aphasia. EXAM: CT ANGIOGRAPHY HEAD AND NECK TECHNIQUE: Multidetector CT imaging of the head and neck was performed using the standard protocol during bolus administration of intravenous contrast. Multiplanar CT image reconstructions and MIPs were obtained to evaluate the vascular anatomy. Carotid stenosis measurements (when applicable) are obtained utilizing NASCET criteria, using the distal internal carotid diameter as the denominator. CONTRAST:  62m OMNIPAQUE IOHEXOL 350 MG/ML SOLN COMPARISON:  None. FINDINGS: CTA NECK FINDINGS Aortic arch: Short common origin of the innominate and left common carotid artery from the aortic arch. Imaged portion shows no evidence of aneurysm or dissection. No significant stenosis of the major arch vessel origins. Right carotid system: Mild atherosclerotic changes of the right carotid bifurcation without hemodynamically significant stenosis. High cervical internal carotid artery bifurcation corresponding to a type 1 proatlantal artery. Left carotid system: Mild atherosclerotic changes of the left carotid bifurcation without hemodynamically significant stenosis. Decrease caliber of the cervical left ICA related to intracranial occlusion. Vertebral arteries: Related to the presence of a right pro atlantal artery. No focal stenosis. Skeleton: Negative. Other neck: Prominent left palatine tonsil. Correlation with direct exam suggested. Upper chest: No acute findings. Review of the MIP images confirms the above findings CTA HEAD FINDINGS Anterior circulation: There is a left ICA terminus occlusion extending to the proximal left A1/ACA and M1/MCA ( "T"  occlusion). There is good leptomeningeal collateral flow to the left MCA vascular tree and filling of the left ACA via anterior communicating artery. Atherosclerotic changes of the right carotid siphon without hemodynamically significant stenosis. The right ACA and MCA vascular trees are patent. Posterior circulation: Diminutive intracranial vertebral arteries mostly supplying the bilateral PICA territories. Right proatlantal artery with normal caliber. The basilar artery and bilateral posterior cerebral arteries are maintained. Venous sinuses: As permitted by contrast timing, patent. Anatomic variants: Type 1 right proatlantal artery. Review of the MIP images confirms the above findings IMPRESSION: 1. Left ICA terminus occlusion extending to the proximal left A1/ACA and M1/MCA ( T occlusion). There is good leptomeningeal collateral flow to the left MCA vascular tree and filling of the left ACA via anterior communicating artery. 2. High cervical right internal carotid artery bifurcation corresponding to origin of a type 1 proatlantal artery. 3. Diminutive intracranial vertebral arteries mostly supplying the bilateral PICA territories. 4. Prominent left palatine tonsil. Correlation with direct exam suggested. These results were called by telephone at the time of interpretation on 03/15/2021 at 2:50 pm to provider Dr. CSu Monks who verbally acknowledged these results. Electronically Signed   By: KPedro EarlsM.D.   On: 03/15/2021 15:07    Procedures Procedures   Medications Ordered in ED Medications  sodium chloride flush (NS) 0.9 % injection 3 mL (3 mLs Intravenous Not Given 03/15/21 1452)   stroke: mapping our early stages of recovery book (has no administration in time range)  0.9 %  sodium chloride infusion ( Intravenous New Bag/Given 03/15/21 1524)  acetaminophen (TYLENOL) tablet 650 mg (has no administration in time range)    Or  acetaminophen (TYLENOL) 160 MG/5ML solution 650 mg  (has no administration in time range)  Or  acetaminophen (TYLENOL) suppository 650 mg (has no administration in time range)  senna-docusate (Senokot-S) tablet 1 tablet (has no administration in time range)  pantoprazole (PROTONIX) injection 40 mg (has no administration in time range)  alteplase (ACTIVASE) 1 mg/mL infusion 52.9 mg (52.9 mg Intravenous New Bag/Given 03/15/21 1422)    Followed by  0.9 %  sodium chloride infusion (has no administration in time range)  albuterol (VENTOLIN HFA) 108 (90 Base) MCG/ACT inhaler 2 puff (has no administration in time range)  cholecalciferol (VITAMIN D3) tablet 1,000 Units (has no administration in time range)  fluticasone (FLOVENT HFA) 110 MCG/ACT inhaler 1 puff (has no administration in time range)  ascorbic acid (VITAMIN C) tablet 500 mg (has no administration in time range)  insulin aspart (novoLOG) injection 0-9 Units (has no administration in time range)  iohexol (OMNIPAQUE) 350 MG/ML injection 75 mL (75 mLs Intravenous Contrast Given 03/15/21 1438)    ED Course  I have reviewed the triage vital signs and the nursing notes.  Pertinent labs & imaging results that were available during my care of the patient were reviewed by me and considered in my medical decision making (see chart for details).    MDM Rules/Calculators/A&P                         Iv ns. Continuous pulse ox and cardiac monitoring. Stat labs and imaging. Code stroke activation. Neurology consulted/met on arrival.   Reviewed nursing notes and prior charts for additional history.   CT reviewed/interpreted by me - no hem.   Labs reviewed/interpreted by me - hgb normal, wbc normal. k normal.   Additional advanced imaging and tpa per neurology team.  CRITICAL CARE RE: code stroke activation with tpa therapy.  Performed by: Mirna Mires Total critical care time: 35 minutes Critical care time was exclusive of separately billable procedures and treating other  patients. Critical care was necessary to treat or prevent imminent or life-threatening deterioration. Critical care was time spent personally by me on the following activities: development of treatment plan with patient and/or surrogate as well as nursing, discussions with consultants, evaluation of patient's response to treatment, examination of patient, obtaining history from patient or surrogate, ordering and performing treatments and interventions, ordering and review of laboratory studies, ordering and review of radiographic studies, pulse oximetry and re-evaluation of patient's condition.  Recheck pt, strength/speech improved from prior. No cp or sob.   Neurology working on neuro unit bed.     Final Clinical Impression(s) / ED Diagnoses Final diagnoses:  None    Rx / DC Orders ED Discharge Orders    None           Lajean Saver, MD 03/15/21 [phone]

## 2021-03-15 NOTE — ED Notes (Signed)
Attempted to give report, floor to call this rn back.  

## 2021-03-15 NOTE — ED Triage Notes (Signed)
BIB [facility] as code stroke from work. Pt LKN 1300, had sudden onset of right side weakness, confusion, and slurred speech. VSS, aox3, 18G LAC

## 2021-03-16 ENCOUNTER — Inpatient Hospital Stay (HOSPITAL_COMMUNITY): Payer: Medicare Other

## 2021-03-16 DIAGNOSIS — I255 Ischemic cardiomyopathy: Secondary | ICD-10-CM

## 2021-03-16 DIAGNOSIS — I6523 Occlusion and stenosis of bilateral carotid arteries: Secondary | ICD-10-CM | POA: Diagnosis not present

## 2021-03-16 DIAGNOSIS — I639 Cerebral infarction, unspecified: Secondary | ICD-10-CM | POA: Diagnosis not present

## 2021-03-16 DIAGNOSIS — F1210 Cannabis abuse, uncomplicated: Secondary | ICD-10-CM

## 2021-03-16 LAB — LIPID PANEL
Cholesterol: 211 mg/dL — ABNORMAL HIGH (ref 0–200)
HDL: 79 mg/dL (ref >40)
LDL Cholesterol: 118 mg/dL — ABNORMAL HIGH (ref 0–99)
Total CHOL/HDL Ratio: 2.7 RATIO
Triglycerides: 69 mg/dL (ref <150)
VLDL: 14 mg/dL (ref 0–40)

## 2021-03-16 LAB — CBC WITH DIFFERENTIAL/PLATELET
Abs Immature Granulocytes: 0.01 10*3/uL (ref 0.00–0.07)
Basophils Absolute: 0 10*3/uL (ref 0.0–0.1)
Basophils Relative: 1 %
Eosinophils Absolute: 0 10*3/uL (ref 0.0–0.5)
Eosinophils Relative: 1 %
HCT: 40 % (ref 36.0–46.0)
Hemoglobin: 13.2 g/dL (ref 12.0–15.0)
Immature Granulocytes: 0 %
Lymphocytes Relative: 35 %
Lymphs Abs: 2.2 10*3/uL (ref 0.7–4.0)
MCH: 27.2 pg (ref 26.0–34.0)
MCHC: 33 g/dL (ref 30.0–36.0)
MCV: 82.3 fL (ref 80.0–100.0)
Monocytes Absolute: 0.5 10*3/uL (ref 0.1–1.0)
Monocytes Relative: 8 %
Neutro Abs: 3.6 10*3/uL (ref 1.7–7.7)
Neutrophils Relative %: 55 %
Platelets: 191 10*3/uL (ref 150–400)
RBC: 4.86 MIL/uL (ref 3.87–5.11)
RDW: 14.6 % (ref 11.5–15.5)
WBC: 6.4 10*3/uL (ref 4.0–10.5)
nRBC: 0 % (ref 0.0–0.2)

## 2021-03-16 LAB — ECHOCARDIOGRAM COMPLETE
AR max vel: 1.54 cm2
AV Area VTI: 1.56 cm2
AV Area mean vel: 1.44 cm2
AV Peak grad: 4.4 mmHg
Ao pk vel: 1.05 m/s
Area-P 1/2: 5.02 cm2
Height: 62 in
MV M vel: 0.5 m/s
MV Peak grad: 1 mmHg
MV VTI: 1.37 cm2
P 1/2 time: 72 msec
Radius: 0.5 cm
S' Lateral: 3.2 cm
Single Plane A2C EF: 24.4 %
Single Plane A4C EF: 35.2 %
Weight: 2074.09 oz

## 2021-03-16 LAB — BASIC METABOLIC PANEL
Anion gap: 10 (ref 5–15)
BUN: 11 mg/dL (ref 8–23)
CO2: 21 mmol/L — ABNORMAL LOW (ref 22–32)
Calcium: 8.8 mg/dL — ABNORMAL LOW (ref 8.9–10.3)
Chloride: 108 mmol/L (ref 98–111)
Creatinine, Ser: 0.85 mg/dL (ref 0.44–1.00)
GFR, Estimated: >60 mL/min (ref >60)
Glucose, Bld: 105 mg/dL — ABNORMAL HIGH (ref 70–99)
Potassium: 3.9 mmol/L (ref 3.5–5.1)
Sodium: 139 mmol/L (ref 135–145)

## 2021-03-16 LAB — HEMOGLOBIN A1C
Hgb A1c MFr Bld: 5.9 % — ABNORMAL HIGH (ref 4.8–5.6)
Mean Plasma Glucose: 122.63 mg/dL

## 2021-03-16 LAB — GLUCOSE, CAPILLARY
Glucose-Capillary: 98 mg/dL (ref 70–99)
Glucose-Capillary: 117 mg/dL — ABNORMAL HIGH (ref 70–99)
Glucose-Capillary: 130 mg/dL — ABNORMAL HIGH (ref 70–99)
Glucose-Capillary: 115 mg/dL — ABNORMAL HIGH (ref 70–99)
Glucose-Capillary: 116 mg/dL — ABNORMAL HIGH (ref 70–99)

## 2021-03-16 LAB — HIV ANTIBODY (ROUTINE TESTING W REFLEX): HIV Screen 4th Generation wRfx: NONREACTIVE

## 2021-03-16 LAB — MRSA PCR SCREENING: MRSA by PCR: NEGATIVE

## 2021-03-16 MED ORDER — CLOPIDOGREL BISULFATE 75 MG PO TABS
75.0000 mg | ORAL_TABLET | Freq: Every day | ORAL | Status: DC
  Administered 2021-03-16 – 2021-03-19 (×4): 75 mg via ORAL
  Filled 2021-03-16 (×4): qty 1

## 2021-03-16 MED ORDER — MELATONIN 3 MG PO TABS
3.0000 mg | ORAL_TABLET | Freq: Every evening | ORAL | Status: DC | PRN
  Administered 2021-03-16 – 2021-03-17 (×2): 3 mg via ORAL
  Filled 2021-03-16 (×3): qty 1

## 2021-03-16 MED ORDER — LABETALOL HCL 5 MG/ML IV SOLN: 5.0000 mg | INTRAVENOUS | Status: DC | PRN

## 2021-03-16 MED ORDER — ASPIRIN EC 325 MG PO TBEC
325.0000 mg | DELAYED_RELEASE_TABLET | Freq: Every day | ORAL | Status: DC
  Administered 2021-03-16 – 2021-03-19 (×4): 325 mg via ORAL
  Filled 2021-03-16 (×4): qty 1

## 2021-03-16 MED ORDER — GADOBUTROL 1 MMOL/ML IV SOLN
6.0000 mL | Freq: Once | INTRAVENOUS | Status: AC | PRN
  Administered 2021-03-16: 6 mL via INTRAVENOUS

## 2021-03-16 NOTE — Progress Notes (Signed)
MRA completed and Dr. Ezzie Dural with Neurology notified at [phone]. RN to continue to monitor.

## 2021-03-16 NOTE — Evaluation (Signed)
Physical Therapy Evaluation & Discharge Patient Details Name: Leah Pierce MRN: [id number] DOB: 11-16-1951 Today's Date: 03/16/2021   History of Present Illness  This 70 y.o. female admitted 4/29 with acute onset aphasia and confusion. CTA revealed Lt ICA terminus occlussion.  She was given tPA, but  due to rapid improvement of symptoms, therefore thrombectomy not performed.  She is awaiting MRI.  PMH includes: Migraines, asthma    Clinical Impression  Pt presents with condition above. 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 She was fully independent PTA. Pt appears to be functioning at baseline, demonstrating intact and symmetrical bil lower extremity strength, coordination, and sensation and ambulating and negotiating stairs without UE support without LOB. Pt is not at risk for falls, as is supported by her DGI score of 24 this date. However, pt does display deficits in path finding and basic mathematical skills, but she is insistent that this is her baseline. All education completed and questions answered. PT will sign off at this time, thank you for this referral.    Follow Up Recommendations No PT follow up    Equipment Recommendations  None recommended by PT    Recommendations for Other Services       Precautions / Restrictions Precautions Precautions: None Restrictions Weight Bearing Restrictions: No      Mobility  Bed Mobility Overal bed mobility: Independent             General bed mobility comments: Pt sitting up in recliner upon arrival.    Transfers Overall transfer level: Independent Equipment used: None             General transfer comment: Pt safely transfers sit <> stand from various surfaces without LOB.  Ambulation/Gait Ambulation/Gait assistance: Supervision Gait Distance (Feet): 750 Feet Assistive device: None Gait  Pattern/deviations: WFL(Within Functional Limits) Gait velocity: WNL Gait velocity interpretation: >4.37 ft/sec, indicative of normal walking speed General Gait Details: Pt ambulates with fairly symmetrical gait pattern with no LOB.  Stairs Stairs: Yes Stairs assistance: Min guard Stair Management: No rails;Alternating pattern;Forwards Number of Stairs: 4 General stair comments: Ascends and descends safely without LOB but mild trunk sway, min guard for safety. Pt reports her stairs at home are much shorter than these standard stairs.  Wheelchair Mobility    Modified Rankin (Stroke Patients Only) Modified Rankin (Stroke Patients Only) Pre-Morbid Rankin Score: No symptoms Modified Rankin: No symptoms     Balance Overall balance assessment: No apparent balance deficits (not formally assessed)                               Standardized Balance Assessment Standardized Balance Assessment : Dynamic Gait Index   Dynamic Gait Index Level Surface: Normal Change in Gait Speed: Normal Gait with Horizontal Head Turns: Normal Gait with Vertical Head Turns: Normal Gait and Pivot Turn: Normal Step Over Obstacle: Normal Step Around Obstacles: Normal Steps: Normal Total Score: 24       Pertinent Vitals/Pain Pain Assessment: No/denies pain    Home Living Family/patient expects to be discharged to:: Private residence Living Arrangements: Alone Available Help at Discharge: Family;Available PRN/intermittently Type of Home: House Home Access: Stairs to enter Entrance Stairs-Rails: None Entrance Stairs-Number of Steps: 2 Home Layout: One level Home Equipment: None      Prior Function Level of  Independence: Independent         Comments: Pt fully independent including driving.  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70     Hand Dominance   Dominant Hand: Right     Extremity/Trunk Assessment   Upper Extremity Assessment Upper Extremity Assessment: Defer to OT evaluation    Lower Extremity Assessment Lower Extremity Assessment: Overall WFL for tasks assessed (bil MMT scores of 4+ to 5 grossly; intact sensation; intact coordination)    Cervical / Trunk Assessment Cervical / Trunk Assessment: Normal  Communication   Communication: No difficulties  Cognition Arousal/Alertness: Awake/alert Behavior During Therapy: WFL for tasks assessed/performed Overall Cognitive Status: Within Functional Limits for tasks assessed                                 General Comments: The Short Blessed test was administered wtih pt scoring 4/28, but she did require increased time to and several redo's to recite the month of the year in reverse order. Pt struggled with path finding with PT, stating she was not looking around before and did not recognizie that she had or had not been in some locations. Pt with difficulty performing math (if given $1 and received 33 cents how much did it cost?), pt took extra time and debated between 66 and 68 cents. Pt reports she has always had difficulty with math and directions, unsure how much is her true baseline.      General Comments General comments (skin integrity, edema, etc.): VSS    Exercises     Assessment/Plan    PT Assessment Patent does not need any further PT services  PT Problem List         PT Treatment Interventions      PT Goals (Current goals can be found in the Care Plan section)  Acute Rehab PT Goals Patient Stated Goal: to go home PT Goal Formulation: With patient Time For Goal Achievement: 03/17/21 Potential to Achieve Goals: Good    Frequency     Barriers to discharge        Co-evaluation               AM-PAC PT "6 Clicks" Mobility  Outcome Measure Help needed turning from your back to your side while in a flat bed without using bedrails?: None Help needed moving from  lying on your back to sitting on the side of a flat bed without using bedrails?: None Help needed moving to and from a bed to a chair (including a wheelchair)?: None Help needed standing up from a chair using your arms (e.g., wheelchair or bedside chair)?: None Help needed to walk in hospital room?: None Help needed climbing 3-5 steps with a railing? : None 6 Click Score: 24    End of Session Equipment Utilized During Treatment: Gait belt Activity Tolerance: Patient tolerated treatment well Patient left: Other (comment) (in wheelchair with transporter) Nurse Communication: Mobility status PT Visit Diagnosis: Other symptoms and signs involving the nervous system (R29.898)    Time: 4680-3212 PT Time Calculation (min) (ACUTE ONLY): 26 min   Charges:   PT Evaluation $PT Eval Low Complexity: 1 Low PT Treatments $Gait Training: 8-22 mins        Raymond Gurney, PT, DPT Acute Rehabilitation Services  Pager: [phone] Office: [phone]   Henrene Dodge  Pettis 03/16/2021, 3:41 PM

## 2021-03-16 NOTE — Progress Notes (Signed)
VASCULAR LAB    Bilateral lower extremity venous duplex has been performed.  See CV proc for preliminary results.   Ivett Luebbe, RVT 03/16/2021, 4:00 PM

## 2021-03-16 NOTE — Progress Notes (Signed)
Occupational Therapy Evaluation Patient Details Name: Leah Pierce MRN: [id number] DOB: 1951-10-31 Today's Date: 03/16/2021    History of Present Illness This 70 y.o. female admitted with acute onset aphasia and confusion. CTA revealed Lt ICA terminus occlussion.  She was given tPA, but  due to rapid improvement of symptoms, therefore thrombectomy not performed.  She is awaiting MRI.  PMH includes: Migraines, asthma   Clinical Impression   Pt admitted with above. She demonstrates the below listed deficits and will benefit from continued OT to maximize safety and independence with BADLs.  Pt presents to OT with deficits with executive functions, although pt is insistent that she is at current cognitive baseline.  She struggled with reciting months of year backwards, and had difficulty with PT during path finding.  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  She retired at the age of 48 and works at [employer] as a Conservation officer, nature. She was fully independent PTA.  OT will follow acutely to further assess higher level cognitive skills.  Currently recommend follow up OPOT.        Follow Up Recommendations  Outpatient OT    Equipment Recommendations  None recommended by OT    Recommendations for Other Services       Precautions / Restrictions Precautions Precautions: None      Mobility Bed Mobility Overal bed mobility: Independent                  Transfers Overall transfer level: Independent                    Balance Overall balance assessment: No apparent balance deficits (not formally assessed)                                         ADL either performed or assessed with clinical judgement   ADL Overall ADL's : Independent                                             Vision Baseline Vision/History: No visual deficits;Wears glasses Wears Glasses: At all times Patient  Visual Report: No change from baseline Vision Assessment?: Yes Eye Alignment: Within Functional Limits Alignment/Gaze Preference: Within Defined Limits Visual Fields: No apparent deficits Additional Comments: able to read menu with good speed and no difficulties     Perception Perception Perception Tested?: Yes   Praxis Praxis Praxis tested?: Within functional limits    Pertinent Vitals/Pain Pain Assessment: No/denies pain     Hand Dominance Right   Extremity/Trunk Assessment Upper Extremity Assessment Upper Extremity Assessment: Overall WFL for tasks assessed   Lower Extremity Assessment Lower Extremity Assessment: Overall WFL for tasks assessed   Cervical / Trunk Assessment Cervical / Trunk Assessment: Normal   Communication Communication Communication: No difficulties   Cognition Arousal/Alertness: Awake/alert Behavior During Therapy: WFL for tasks assessed/performed Overall Cognitive Status: Within Functional Limits for tasks assessed                                 General Comments: The Short Blessed test was administered wtih pt scoring 4/28, but she did require increased time to and several redo's to  recite the month of the year in reverse order.  Pt struggled with path finding with PT.   General Comments  VSS    Exercises     Shoulder Instructions      Home Living Family/patient expects to be discharged to:: Private residence Living Arrangements: Alone Available Help at Discharge: Family;Available PRN/intermittently Type of Home: House Home Access: Stairs to enter [employer] of Steps: 2 Entrance Stairs-Rails: None Home Layout: One level     Bathroom Shower/Tub: IT trainer: Standard     Home Equipment: None          Prior Functioning/Environment Level of Independence: Independent        Comments: Pt fully independent including driving.  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10        OT Problem List:        OT Treatment/Interventions:      OT Goals(Current goals can be found in the care plan section) Acute Rehab OT Goals Patient Stated Goal: to go home OT Goal Formulation: With patient Time For Goal Achievement: 03/29/21 Potential to Achieve Goals: Good ADL Goals Additional ADL Goal #1: Pt will perform moderately challenging path finding task with no more than min cues Additional ADL Goal #2: Pt will perform simulated medication management task with supervision  OT Frequency:     Barriers to D/C:            Co-evaluation              AM-PAC OT "6 Clicks" Daily Activity     Outcome Measure Help from another person eating meals?: None Help from another person taking care of personal grooming?: None Help from another person toileting, which includes using toliet, bedpan, or urinal?: None Help from another person bathing (including washing, rinsing, drying)?: None Help from another person to put on and taking off regular upper body clothing?: None Help from another person to put on and taking off regular lower body clothing?: None 6 Click Score: 24   End of Session Nurse Communication: Mobility status  Activity Tolerance: Patient tolerated treatment well Patient left: in chair;with call bell/phone within reach;with chair alarm set                   Time: 1696-7893 OT Time Calculation (min): 25 min Charges:  OT General Charges $OT Visit: 1 Visit OT Evaluation $OT Eval Low Complexity: 1 Low OT Treatments $Therapeutic Activity: 8-22 mins  Eber Jones., OTR/L Acute Rehabilitation Services Pager [phone] Office [phone]   Jeani Hawking M 03/16/2021, 3:12 PM

## 2021-03-16 NOTE — Plan of Care (Signed)
  Problem: Education: Goal: Knowledge of secondary prevention will improve Outcome: Progressing Goal: Knowledge of patient specific risk factors addressed and post discharge goals established will improve Outcome: Progressing   Problem: Nutrition: Goal: Risk of aspiration will decrease Outcome: Progressing Goal: Dietary intake will improve Outcome: Progressing

## 2021-03-16 NOTE — Evaluation (Signed)
Speech Language Pathology Evaluation Patient Details Name: Leah Pierce MRN: [id number] DOB: [birth year] Today's Date: 03/16/2021 Time: 1020-1040 SLP Time Calculation (min) (ACUTE ONLY): 20 min  Problem List:  Patient Active Problem List   Diagnosis Date Noted  . Stroke determined by clinical assessment (HCC) 03/15/2021  . Hyperlipidemia 08/08/2020  . Prediabetes 08/08/2020  . Osteoporosis 06/10/2018  . Allergic rhinitis 09/23/2017  . Asthma, intermittent, uncomplicated 09/23/2017   Past Medical History:  Past Medical History:  Diagnosis Date  . Allergy   . Arthritis   . Asthma    hx of  . Chicken pox   . Heart murmur   . Migraines    Past Surgical History:  Past Surgical History:  Procedure Laterality Date  . CESAREAN SECTION    . COLONOSCOPY    . TONSILLECTOMY  1959   HPI:  Leah Pierce is a 70 y.o. female with a medical history significant for hyperlipidemia, prediabetes, and asthma who presented to the ED today for evaluation of acute onset of aphasia while at work. Co-workers noted that Leah Pierce seemed confused and activated EMS after approximately 10 minutes of onset. On arrival, they noted that Leah Pierce was aphasic and had mild right-sided mouth drooping and activated a stroke alert for transport to [facility].   She was scheduled to get tPA and then for possible clot retrieval when most of her symptoms resolved.  CT of her head was showing no evidence of acute intracranial abnormality.  MRI of the head is pending.   Assessment / Plan / Recommendation Clinical Impression  Language evaluation and motor speech screen were completed.  Cranial nerve exam was completed and unremarkable.  Lingual, labial, facial and jaw range of motion and strength appeared adequate.  Facial sensation appeared to be intact and she did not endorse a difference in sensation from the right to left side of her face.  Speech was clear and easy to understand.  No discernible  dysarthria or apraxia were noted.  Her language skills appeared to be intact.  She achieved an overall score of 100/100 on the [place] Aphasia Screening Test.  Speech was fluent and she was able to easily decribe reason for admission.  She was able to name pictures, follow directions, answer yes/no question, identify pictures, read/comprehend sentences, describe a picture and write dictated words/phrases.  Given results of this evaluation ST follow up is not indicated.  If we can be of further assistance please feel free to reconsult.    SLP Assessment  SLP Recommendation/Assessment: Patient does not need any further Speech Lanaguage Pathology Services SLP Visit Diagnosis: Aphasia (R47.01)    Follow Up Recommendations  None          SLP Evaluation Cognition  Orientation Level: Oriented X4       Comprehension  Auditory Comprehension Overall Auditory Comprehension: Appears within functional limits for tasks assessed Yes/No Questions: Within Functional Limits Commands: Within Functional Limits Conversation: Complex Reading Comprehension Reading Status: Within funtional limits    Expression Expression Primary Mode of Expression: Verbal Verbal Expression Overall Verbal Expression: Appears within functional limits for tasks assessed Initiation: No impairment Automatic Speech: Name;Social Response;Counting;Day of week Level of Generative/Spontaneous Verbalization: Conversation Repetition: No impairment Naming: No impairment Pragmatics: No impairment Non-Verbal Means of Communication: Not applicable Written Expression Dominant Hand: Right Written Expression: Within Functional Limits   Oral / Motor  Oral Motor/Sensory Function Overall Oral Motor/Sensory Function: Within functional limits Motor Speech Overall Motor Speech: Appears within functional limits for  tasks assessed Respiration: Within functional limits Phonation: Normal Resonance: Within functional  limits Articulation: Within functional limitis Intelligibility: Intelligible Motor Planning: Witnin functional limits Motor Speech Errors: Not applicable   GO                   Dimas Aguas, MA, CCC-SLP Acute Rehab SLP [phone]  Fleet Contras 03/16/2021, 10:48 AM

## 2021-03-16 NOTE — Progress Notes (Signed)
STROKE TEAM PROGRESS NOTE   INTERVAL HISTORY Her RN is at the bedside.  Patient lying in bed, no acute distress, stated that her symptoms resolved.  Her speech and right sided weakness has resolved.  MRA head and neck overnight no acute abnormality.  MRI pending 2 PM today.  OBJECTIVE Vitals:   03/16/21 0200 03/16/21 0300 03/16/21 0412 03/16/21 0500  BP: 118/73 128/83 (!) 140/94 125/90  Pulse: 88 81  83  Resp: 18 17  16   Temp:      TempSrc:      SpO2: 95% 96% 98% 100%  Weight:      Height:        CBC:  Recent Labs  Lab 03/15/21 1404 03/15/21 1411 03/16/21 0548  WBC 6.6  --  6.4  NEUTROABS 3.3  --  3.6  HGB 13.5 14.3 13.2  HCT 43.0 42.0 40.0  MCV 86.0  --  82.3  PLT 203  --  191    Basic Metabolic Panel:  Recent Labs  Lab 03/15/21 1404 03/15/21 1411 03/16/21 0548  NA 137 138 139  K 4.0 3.9 3.9  CL 107 108 108  CO2 21*  --  21*  GLUCOSE 97 92 105*  BUN 13 14 11   CREATININE 1.04* 0.90 0.85  CALCIUM 9.2  --  8.8*    Lipid Panel:     Component Value Date/Time   CHOL 211 (H) 03/16/2021 0548   TRIG 69 03/16/2021 0548   HDL 79 03/16/2021 0548   CHOLHDL 2.7 03/16/2021 0548   VLDL 14 03/16/2021 0548   LDLCALC 118 (H) 03/16/2021 0548   HgbA1c:  Lab Results  Component Value Date   HGBA1C 5.9 (H) 03/16/2021   Urine Drug Screen:     Component Value Date/Time   LABOPIA NONE DETECTED 03/15/2021 1830   COCAINSCRNUR NONE DETECTED 03/15/2021 1830   LABBENZ NONE DETECTED 03/15/2021 1830   AMPHETMU NONE DETECTED 03/15/2021 1830   THCU POSITIVE (A) 03/15/2021 1830   LABBARB NONE DETECTED 03/15/2021 1830    Alcohol Level No results found for: ETH  IMAGING  MR ANGIO HEAD WO CONTRAST MR ANGIO NECK W WO CONTRAST 03/16/2021 IMPRESSION:  1. Patent Left ICA terminus but hemodynamically significant stenoses of BOTH ICA siphons at the distal cavernous/anterior genu level.  2. No large vessel occlusion identified. Left ACA remains patent with moderate origin stenosis.  Left MCA is patent with mild origin stenosis.  3. Right side Persistent Hypoglossal Artery, rare anatomic variant, supplies the Basilar. Both vertebral arteries are diminutive, and the left terminates in PICA.  4. Mild left and mild to moderate stenosis bilateral PCA P1/P2.   CT HEAD CODE STROKE WO CONTRAST 03/15/2021 IMPRESSION:  No evidence of acute intracranial abnormality.  ASPECTS is 10. Small region of cortical calcification within the right occipital lobe (PCA vascular territory). This is nonspecific, but may reflect dystrophic calcification at site of a chronic infarct. Chronic lacunar infarct within the right thalamus. Mild generalized parenchymal atrophy.   CT ANGIO HEAD CODE STROKE CT ANGIO NECK CODE STROKE 03/15/2021 IMPRESSION:  1. Left ICA terminus occlusion extending to the proximal left A1/ACA and M1/MCA ( T occlusion). There is good leptomeningeal collateral flow to the left MCA vascular tree and filling of the left ACA via anterior communicating artery.  2. High cervical right internal carotid artery bifurcation corresponding to origin of a type 1 proatlantal artery.  3. Diminutive intracranial vertebral arteries mostly supplying the bilateral PICA territories.  4. Prominent left palatine  tonsil. Correlation with direct exam suggested.  MRI Brain WO Contrast Three punctate acute infarctions in the left MCA territory, 1 affecting the deep insular cortex on the left, 1 affecting the white matter of the corona radiography on the left and 1 affecting the left parietal vertex cortex. No large confluent infarction. No swelling or hemorrhage.  Transthoracic Echocardiogram  1. Left ventricular ejection fraction, by estimation, is 30 to 35%. The  left ventricle has moderately decreased function. The left ventricle  demonstrates global hypokinesis. There is moderate concentric left  ventricular hypertrophy. Left ventricular  diastolic parameters are consistent with Grade III  diastolic dysfunction  (restrictive). Elevated left atrial pressure.  2. Right ventricular systolic function is normal. The right ventricular  size is normal. There is normal pulmonary artery systolic pressure.  3. Left atrial size was severely dilated.  4. The mitral valve is myxomatous. Moderate to severe mitral valve  regurgitation. There is mild holosystolic prolapse of the middle segment  of the anterior leaflet of the mitral valve.  5. The aortic valve is tricuspid. Aortic valve regurgitation is not  visualized. No aortic stenosis is present.   ECG - SR rate 93 BPM. (See cardiology reading for complete details)  PHYSICAL EXAM  Temp:  [97.9 F (36.6 C)-99.6 F (37.6 C)] 97.9 F (36.6 C) (04/30 2000) Pulse Rate:  [81-102] 96 (04/30 1800) Resp:  [14-34] 28 (04/30 1800) BP: (96-160)/(54-109) 144/97 (04/30 1800) SpO2:  [94 %-100 %] 100 % (04/30 2037)  General - Well nourished, well developed, in no apparent distress.  Ophthalmologic - fundi not visualized due to noncooperation.  Cardiovascular - Regular rhythm and rate.  Mental Status -  Level of arousal and orientation to time, place, and person were intact. Language including expression, naming, repetition, comprehension was assessed and found intact.  Cranial Nerves II - XII - II - Visual field intact OU. III, IV, VI - Extraocular movements intact. V - Facial sensation intact bilaterally. VII - Facial movement intact bilaterally. VIII - Hearing & vestibular intact bilaterally. X - Palate elevates symmetrically. XI - Chin turning & shoulder shrug intact bilaterally. XII - Tongue protrusion intact.  Motor Strength - The patient's strength was normal in all extremities and pronator drift was absent.  Bulk was normal and fasciculations were absent.   Motor Tone - Muscle tone was assessed at the neck and appendages and was normal.  Reflexes - The patient's reflexes were symmetrical in all extremities and she had no  pathological reflexes.  Sensory - Light touch, temperature/pinprick were assessed and were symmetrical.    Coordination - The patient had normal movements in the hands with no ataxia or dysmetria.  Tremor was absent.  Gait and Station - deferred.    ASSESSMENT/PLAN [name] is a 70 y.o. female with history of hyperlipidemia, migraines, prediabetes, and asthma who presented to the ED today for evaluation of acute onset of aphasia, mild right facial droop, RLE drift and possible confusion. CTA revealed left ICA terminus occlusion extending to the proximal left A1/ACA and M1/MCA. The patient received IV t-PA Friday 03/15/21 at 1545.  Stroke: Left MCA punctate infarct due to left terminal ICA occlusion status post tPA, etiology likely due to large vessel disease vs. cardiomyopathy with low EF  CT Head -  No evidence of acute intracranial abnormality.  ASPECTS is 10. Chronic lacunar infarct within the right thalamus.    CTA H&N - Left ICA terminus occlusion extending to the proximal left A1/ACA and M1/MCA (  T occlusion). There is good leptomeningeal collateral flow to the left MCA vascular tree and filling of the left ACA via anterior communicating artery. High cervical right internal carotid artery bifurcation corresponding to origin of a type 1 proatlantal artery. Diminutive intracranial vertebral arteries mostly supplying the bilateral PICA territories.   MRA H&N - Patent Left ICA terminus but hemodynamically significant stenoses of BOTH ICA siphons at the distal cavernous/anterior genu level. No large vessel occlusion identified. Left ACA remains patent with moderate origin stenosis. Left MCA is patent with mild origin stenosis. Right side Persistent Hypoglossal Artery, rare anatomic variant, supplies the Basilar. Both vertebral arteries are diminutive, and the left terminates in PICA. Mild left and mild to moderate stenosis bilateral PCA P1/P2.   MRI head - 3 punctate left MCA  infarcts  2D Echo - EF 30 to 35%  Will consider 30-day cardiac event monitoring to rule out A. fib as outpatient  Loyal Jacobson Virus 2 - negative  LDL - 118  HgbA1c - 5.9  UDS - THCU   VTE prophylaxis - SCDs  No antithrombotic prior to admission, now on aspirin 325 and Plavix 75 DAPT for 3 months and then aspirin alone.  Patient will be counseled to be compliant with her antithrombotic medications  Ongoing aggressive stroke risk factor management  Therapy recommendations: Outpatient OT  Disposition:  Pending  Carotid stenosis  CTA H&N - Left ICA terminus occlusion extending to the proximal left A1/ACA and M1/MCA ( T occlusion).   MRA H&N - Patent Left ICA terminus but hemodynamically significant stenoses of BOTH ICA siphons at the distal cavernous/anterior genu level.  On aspirin 325 and Plavix 75 DAPT for 3 months  Cardiomyopathy  2D echo showed EF 30 to 35%  Could be potential etiology for embolic infarct  Will consult cardiology in a.m.  Hyperlipidemia  Home Lipid lowering medication: Lipitor 20 mg daily   LDL 118, goal < 70  Current lipid lowering medication: Lipitor 40 mg daily   Continue statin at discharge  THC abuse  Patient stated that she started using THC since she was 70 years old  Long-term THC use also a risk factor for stroke  Cessation education provided  Patient is willing to quit  Other Stroke Risk Factors  Advanced age  ETOH use, advised to drink no more than 1 alcoholic beverage per day.  Family hx stroke (mother)   Hx stroke/TIA - by imaging   Hx of migraines  Other Active Problems, Findings, Recommendations and/or Plan  Code status - Full code    Hospital day # 1  This patient is critically ill due to acute stroke, carotid stenosis, status post tPA, cardiomyopathy and at significant risk of neurological worsening, death form recurrent stroke, hemorrhagic conversion, hemorrhage from tPA, heart failure, seizure. This  patient's care requires constant monitoring of vital signs, hemodynamics, respiratory and cardiac monitoring, review of multiple databases, neurological assessment, discussion with family, other specialists and medical decision making of high complexity. I spent 40 minutes of neurocritical care time in the care of this patient.  Marvel Plan, MD PhD Stroke Neurology 03/16/2021 9:09 PM   To contact Stroke Continuity provider, please refer to [email or web address]. After hours, contact General Neurology

## 2021-03-16 NOTE — Plan of Care (Signed)
  Problem: Education: Goal: Knowledge of disease or condition will improve Outcome: Progressing   

## 2021-03-17 DIAGNOSIS — E785 Hyperlipidemia, unspecified: Secondary | ICD-10-CM | POA: Diagnosis not present

## 2021-03-17 DIAGNOSIS — I639 Cerebral infarction, unspecified: Secondary | ICD-10-CM | POA: Diagnosis not present

## 2021-03-17 DIAGNOSIS — F1220 Cannabis dependence, uncomplicated: Secondary | ICD-10-CM | POA: Diagnosis not present

## 2021-03-17 DIAGNOSIS — I6523 Occlusion and stenosis of bilateral carotid arteries: Secondary | ICD-10-CM | POA: Diagnosis not present

## 2021-03-17 DIAGNOSIS — I255 Ischemic cardiomyopathy: Secondary | ICD-10-CM | POA: Diagnosis not present

## 2021-03-17 DIAGNOSIS — I429 Cardiomyopathy, unspecified: Secondary | ICD-10-CM

## 2021-03-17 DIAGNOSIS — I340 Nonrheumatic mitral (valve) insufficiency: Secondary | ICD-10-CM

## 2021-03-17 LAB — GLUCOSE, CAPILLARY
Glucose-Capillary: 155 mg/dL — ABNORMAL HIGH (ref 70–99)
Glucose-Capillary: 126 mg/dL — ABNORMAL HIGH (ref 70–99)
Glucose-Capillary: 103 mg/dL — ABNORMAL HIGH (ref 70–99)
Glucose-Capillary: 119 mg/dL — ABNORMAL HIGH (ref 70–99)

## 2021-03-17 MED ORDER — INSULIN ASPART 100 UNIT/ML IJ SOLN: 0.0000 [IU] | Freq: Three times a day (TID) | INTRAMUSCULAR | Status: DC

## 2021-03-17 MED ORDER — FUROSEMIDE 10 MG/ML IJ SOLN
20.0000 mg | Freq: Once | INTRAMUSCULAR | Status: AC
  Administered 2021-03-17: 20 mg via INTRAVENOUS
  Filled 2021-03-17: qty 4

## 2021-03-17 NOTE — Consult Note (Addendum)
Cardiology Consultation:   Patient ID: Leah Pierce MRN: [id number]; DOB: Jul 19, 1951  Admit date: 03/15/2021 Date of Consult: 03/17/2021  PCP:  [place], Betty G, MD   [facility]  Cardiologist:  New to [facility] Advanced Practice Provider:  No care team member to display Electrophysiologist:  None   Patient Profile:   Leah Pierce is a 70 y.o. female with a PMH of HLD, pre-DM type 2, and asthma who is being seen today for the evaluation of CHF at the request of Dr. Roda Shutters.  History of Present Illness:   Leah Pierce was in her usual state of health until 03/15/21 when she was noted to have acute onset aphasia while at work. EMS was activated and on arrival she was noted to have mild right-sided mouth drooping. Code Stroke was activated and patient was transported to [facility]. Imaging c/f left ICA terminus occlusion extending to the proximal left A1/ACA and M1/MCA. MRI brain showed 3 punctate acute infarctions in the left MCA territory.  Neurology administered tPA and right sided weakness and aphasia resolved. MRA neck suggested hemodynamically significant stenosis of both ICA. She was started on aspirin 325mg  daily and plavix 75mg  daily for 3 months, then anticipate aspirin monotherapy. Home atorvastatin increased to 40mg  daily. 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 Cardiology asked to evaluate.  She has no prior cardiac history and does not follow with cardiology outpatient. She has had no cardiac testing prior to this admission. She does report a strong family history of CHF (multiple siblings) and mitral valve disease (son s/p MVR, ultimately passed from renal failure).   At the time of this evaluation she is feeling back to her usual self. She denies any trouble with  chest pain or SOB with activity. She is able to walk any distance, up a flight of stairs, and do regular ADLs/household chores without anginal complaints. She denies history of palpitations or LE edema. No complaints of orthopnea, PND, dizziness, lightheadedness, or syncope.   Past Medical History:  Diagnosis Date  . Allergy   . Arthritis   . Asthma    hx of  . Chicken pox   . Heart murmur   . Migraines     Past Surgical History:  Procedure Laterality Date  . CESAREAN SECTION    . COLONOSCOPY    . TONSILLECTOMY  1959     Home Medications:  Prior to Admission medications   Medication Sig Start Date End Date Taking? Authorizing Provider  albuterol (VENTOLIN HFA) 108 (90 Base) MCG/ACT inhaler Inhale 2 puffs into the lungs every 6 (six) hours as needed for wheezing or shortness of breath. 02/06/21  Yes [place], Betty G, MD  alendronate (FOSAMAX) 70 MG tablet Take 1 tablet (70 mg total) by mouth every 7 (seven) days. Take with a full glass of water on an empty stomach. Patient taking differently: Take 70 mg by mouth every 7 (seven) days. Sunday 08/11/20  Yes [place], Betty G, MD  cholecalciferol (VITAMIN D3) 25 MCG (1000 UNIT) tablet Take 1,000 Units by mouth daily.   Yes [provider]  fluticasone (FLOVENT HFA) 110 MCG/ACT inhaler Inhale 1 puff into the lungs in the morning and at bedtime. 05/04/20  Yes [place], Betty G, MD  Misc Natural Products (OSTEO BI-FLEX ADV JOINT SHIELD) TABS Take 1 tablet  by mouth daily.   Yes [provider]  OVER THE COUNTER MEDICATION Take 1 tablet by mouth daily. NEURIVA   Yes [provider]  vitamin C (ASCORBIC ACID) 500 MG tablet Take 500 mg by mouth daily.   Yes [provider]  vitamin E 200 UNIT capsule Take 200 Units by mouth daily.   Yes [provider]  atorvastatin (LIPITOR) 20 MG tablet Take 1 tablet (20 mg total) by mouth daily. Patient not taking: Reported on 03/15/2021 08/11/20   [place], Betty G, MD   Spacer/Aero-Holding Chambers (AEROCHAMBER PLUS) inhaler Use as instructed to use with inahaler. 05/04/20   [place], Betty G, MD    Inpatient Medications: Scheduled Meds: . vitamin C  500 mg Oral Daily  . aspirin EC  325 mg Oral Daily  . atorvastatin  40 mg Oral Daily  . budesonide (PULMICORT) nebulizer solution  0.25 mg Nebulization BID  . Chlorhexidine Gluconate Cloth  6 each Topical Daily  . cholecalciferol  1,000 Units Oral Daily  . clopidogrel  75 mg Oral Daily  . insulin aspart  0-9 Units Subcutaneous Q4H  . sodium chloride flush  3 mL Intravenous Once   Continuous Infusions: . sodium chloride     PRN Meds: acetaminophen **OR** acetaminophen (TYLENOL) oral liquid 160 mg/5 mL **OR** acetaminophen, albuterol, labetalol, melatonin, senna-docusate  Allergies:    Allergies  Allergen Reactions  . Tetracyclines & Related     Rash on hands    Social History:   Social History   Socioeconomic History  . Marital status: Single    Spouse name: Not on file  . Number of children: Not on file  . Years of education: Not on file  . Highest education level: Not on file  Occupational History  . Not on file  Tobacco Use  . Smoking status: Never Smoker  . Smokeless tobacco: Never Used  Vaping Use  . Vaping Use: Never used  Substance and Sexual Activity  . Alcohol use: Yes    Comment: occasionally  . Drug use: Yes    Frequency: 2.0 times per week    Types: Marijuana  . Sexual activity: Not on file  Other Topics Concern  . Not on file  Social History Narrative  . Not on file   Social Determinants of Health   Financial Resource Strain: Not on file  Food Insecurity: Not on file  Transportation Needs: Not on file  Physical Activity: Not on file  Stress: Not on file  Social Connections: Not on file  Intimate Partner Violence: Not on file    Family History:    Family History  Problem Relation Age of Onset  . Arthritis Mother   . Stroke Mother   . Hypertension Mother    . Diabetes Mother   . Colon polyps Mother   . Arthritis Father   . Cancer Father        Prostate  . Cancer Brother        prostate  . Cancer Brother        Pancreas  . Colon cancer Neg Hx   . Esophageal cancer Neg Hx   . Rectal cancer Neg Hx   . Stomach cancer Neg Hx      ROS:  Please see the history of present illness.   All other ROS reviewed and negative.     Physical Exam/Data:   Vitals:   03/17/21 0500 03/17/21 0800 03/17/21 0829 03/17/21 0846  BP: (!) 127/100 (!) 142/99  Pulse: 83 90    Resp: 16 (!) 24    Temp:  (!) 97.4 F (36.3 C) (!) 97.4 F (36.3 C)   TempSrc:  Axillary Oral   SpO2: 98% 100%  100%  Weight:      Height:        Intake/Output Summary (Last 24 hours) at 03/17/2021 1043 Last data filed at 03/17/2021 0830 Gross per 24 hour  Intake 378.97 ml  Output 1000 ml  Net -621.03 ml   Last 3 Weights 03/15/2021 03/15/2021 08/08/2020  Weight (lbs) 129 lb 10.1 oz 129 lb 10.1 oz 130 lb  Weight (kg) 58.8 kg 58.8 kg 58.968 kg     Body mass index is 23.71 kg/m.  General:  Pleasant, well nourished, well developed, in no acute distress HEENT: sclera anicteric Neck: +JVD Vascular: No carotid bruits; distal pulses 2+ bilaterally without bruits  Cardiac:  normal S1, S2; RRR; +murmur, no rubs or gallops  Lungs:  clear to auscultation bilaterally, no wheezing, rhonchi or rales  Abd: soft, nontender, no hepatomegaly  Ext: no edema Musculoskeletal:  No deformities, BUE and BLE strength normal and equal Skin: warm and dry  Neuro:  CNs 2-12 intact, no focal abnormalities noted Psych:  Normal affect   EKG:  The EKG was personally reviewed and demonstrates:  Sinus rhythm, rate 93 bpm, non-specific ST-T wave abnormalities, no STE/D; no comparison Telemetry:  Telemetry was personally reviewed and demonstrates:  NSR  Relevant CV Studies: Echocardiogram 03/16/21: 1. Left ventricular ejection fraction, by estimation, is 30 to 35%. The  left ventricle has moderately  decreased function. The left ventricle  demonstrates global hypokinesis. There is moderate concentric left  ventricular hypertrophy. Left ventricular  diastolic parameters are consistent with Grade III diastolic dysfunction  (restrictive). Elevated left atrial pressure.  2. Right ventricular systolic function is normal. The right ventricular  size is normal. There is normal pulmonary artery systolic pressure.  3. Left atrial size was severely dilated.  4. The mitral valve is myxomatous. Moderate to severe mitral valve  regurgitation. There is mild holosystolic prolapse of the middle segment  of the anterior leaflet of the mitral valve.  5. The aortic valve is tricuspid. Aortic valve regurgitation is not  visualized. No aortic stenosis is present.   Laboratory Data:  High Sensitivity Troponin:  No results for input(s): TROPONINIHS in the last 720 hours.   Chemistry Recent Labs  Lab 03/15/21 1404 03/15/21 1411 03/16/21 0548  NA 137 138 139  K 4.0 3.9 3.9  CL 107 108 108  CO2 21*  --  21*  GLUCOSE 97 92 105*  BUN 13 14 11   CREATININE 1.04* 0.90 0.85  CALCIUM 9.2  --  8.8*  GFRNONAA 59*  --  >60  ANIONGAP 9  --  10    Recent Labs  Lab 03/15/21 1404  PROT 6.4*  ALBUMIN 3.7  AST 23  ALT 17  ALKPHOS 35*  BILITOT 0.6   Hematology Recent Labs  Lab 03/15/21 1404 03/15/21 1411 03/16/21 0548  WBC 6.6  --  6.4  RBC 5.00  --  4.86  HGB 13.5 14.3 13.2  HCT 43.0 42.0 40.0  MCV 86.0  --  82.3  MCH 27.0  --  27.2  MCHC 31.4  --  33.0  RDW 14.6  --  14.6  PLT 203  --  191   BNPNo results for input(s): BNP, PROBNP in the last 168 hours.  DDimer No results for input(s): DDIMER in the last  168 hours.   Radiology/Studies:  MR ANGIO HEAD WO CONTRAST  Result Date: 03/16/2021 CLINICAL DATA:  70 year old female with code stroke presentation. Right facial droop. Left ICA terminus occlusion affecting the left ACA and MCA origins (T occlusion) with ACA and MCA collaterals on  CTA head and neck yesterday. EXAM: MRA HEAD WITHOUT CONTRAST MRA NECK WITHOUT AND WITH CONTRAST TECHNIQUE: Angiographic images of the Circle of [place] were obtained using MRA technique without intravenous contrast. Angiographic images of the neck were obtained using MRA technique without and with intravenous contrast. Carotid stenosis measurements (when applicable) are obtained utilizing NASCET criteria, using the distal internal carotid diameter as the denominator. CONTRAST:  6mL GADAVIST GADOBUTROL 1 MMOL/ML IV SOLN COMPARISON:  CTA head and neck 03/15/2021. FINDINGS: MRA NECK FINDINGS Precontrast time-of-flight images demonstrate antegrade flow signal in the bilateral common carotid arteries, both carotid bifurcations and cervical ICAs to the C2 cervical level. The left ICA appears asymmetrically smaller, but with otherwise fairly symmetric flow signal. New 1 anatomic variation of the right ICA supplying collateral flow to the right vertebral artery via hypoglossal canal redemonstrated (persistent hypoglossal artery normal variant). There is antegrade flow in bilateral diminutive vertebral arteries, that on the right is highly diminutive. Post-contrast neck MRA images reveal a 3 vessel arch configuration with patent great vessels. Mildly tortuous right CCA without stenosis. Patent right carotid bifurcation. Mild irregularity at the right ICA origin resulting in less than 50% stenosis. Patent right ICA to the skull base with tortuosity and persistent hypoglossal artery (series 1013, image 3). Mildly tortuous left CCA without stenosis. Mild plaque and irregularity at the left ICA origin and bulb with less than 50% stenosis. Patent left ICA to the skull base. Diminutive bilateral vertebral arteries are patent, with limited detail of both vertebral artery origins. Tortuous V2 segments with no convincing vertebral stenosis in the neck. Additionally the intracranial circulation is also visible postcontrast. The left  vertebral artery functionally terminates in PICA. The persistent right hypoglossal artery primarily supplies the basilar. There is right PCA stenosis, see below. Both ICA siphons are patent but demonstrate moderate to severe distal cavernous/anterior genu stenosis bilaterally (series 1007, image 14). Both supraclinoid ICA segments are patent and enhancing. Both carotid termini appear patent on these images. There is stenosis at the left ACA origin. MRA HEAD FINDINGS Left vertebral artery is diminutive and terminates in PICA. Highly diminutive distal right vertebral artery is patent to PICA, and communicates with the persistent hypoglossal artery on the right side which supplies the basilar. Patent basilar artery without stenosis. Patent SCA and PCA origins. Posterior communicating arteries are diminutive or absent. Left PCA branches appear within normal limits. On these images there is only mild right PCA P1 irregularity, but postcontrast MRA above indicated mild to moderate right P1 stenosis, with mild bilateral P2 segment irregularity. Distal PCA branch flow signal is symmetric. Antegrade flow in both ICA siphons with distal cavernous/anterior genu irregularity corresponding to the hemodynamically significant stenosis suspected above. Ophthalmic artery origins remain patent. Both carotid termini are patent. Right MCA and ACA origins are patent and within normal limits. Right A1, right M1, right MCA bifurcation, and visible right ACA and MCA branches are within normal limits. There is moderate stenosis at the left ACA origin. The left MCA origin appears mildly irregular. Both left M1 and A1 remain patent visible left ACA branches are within normal limits. Left MCA M1 and bifurcation are patent without stenosis. No left MCA branch occlusion is identified. IMPRESSION: 1. Patent Left  ICA terminus but hemodynamically significant stenoses of BOTH ICA siphons at the distal cavernous/anterior genu level. 2. No large  vessel occlusion identified. Left ACA remains patent with moderate origin stenosis. Left MCA is patent with mild origin stenosis. 3. Right side Persistent Hypoglossal Artery, rare anatomic variant, supplies the Basilar. Both vertebral arteries are diminutive, and the left terminates in PICA. 4. Mild left and mild to moderate stenosis bilateral PCA P1/P2. Electronically Signed   By: Odessa Fleming M.D.   On: 03/16/2021 04:59   MR ANGIO NECK W WO CONTRAST  Result Date: 03/16/2021 CLINICAL DATA:  70 year old female with code stroke presentation. Right facial droop. Left ICA terminus occlusion affecting the left ACA and MCA origins (T occlusion) with ACA and MCA collaterals on CTA head and neck yesterday. EXAM: MRA HEAD WITHOUT CONTRAST MRA NECK WITHOUT AND WITH CONTRAST TECHNIQUE: Angiographic images of the Circle of [place] were obtained using MRA technique without intravenous contrast. Angiographic images of the neck were obtained using MRA technique without and with intravenous contrast. Carotid stenosis measurements (when applicable) are obtained utilizing NASCET criteria, using the distal internal carotid diameter as the denominator. CONTRAST:  24mL GADAVIST GADOBUTROL 1 MMOL/ML IV SOLN COMPARISON:  CTA head and neck 03/15/2021. FINDINGS: MRA NECK FINDINGS Precontrast time-of-flight images demonstrate antegrade flow signal in the bilateral common carotid arteries, both carotid bifurcations and cervical ICAs to the C2 cervical level. The left ICA appears asymmetrically smaller, but with otherwise fairly symmetric flow signal. New 1 anatomic variation of the right ICA supplying collateral flow to the right vertebral artery via hypoglossal canal redemonstrated (persistent hypoglossal artery normal variant). There is antegrade flow in bilateral diminutive vertebral arteries, that on the right is highly diminutive. Post-contrast neck MRA images reveal a 3 vessel arch configuration with patent great vessels. Mildly tortuous  right CCA without stenosis. Patent right carotid bifurcation. Mild irregularity at the right ICA origin resulting in less than 50% stenosis. Patent right ICA to the skull base with tortuosity and persistent hypoglossal artery (series 1013, image 3). Mildly tortuous left CCA without stenosis. Mild plaque and irregularity at the left ICA origin and bulb with less than 50% stenosis. Patent left ICA to the skull base. Diminutive bilateral vertebral arteries are patent, with limited detail of both vertebral artery origins. Tortuous V2 segments with no convincing vertebral stenosis in the neck. Additionally the intracranial circulation is also visible postcontrast. The left vertebral artery functionally terminates in PICA. The persistent right hypoglossal artery primarily supplies the basilar. There is right PCA stenosis, see below. Both ICA siphons are patent but demonstrate moderate to severe distal cavernous/anterior genu stenosis bilaterally (series 1007, image 14). Both supraclinoid ICA segments are patent and enhancing. Both carotid termini appear patent on these images. There is stenosis at the left ACA origin. MRA HEAD FINDINGS Left vertebral artery is diminutive and terminates in PICA. Highly diminutive distal right vertebral artery is patent to PICA, and communicates with the persistent hypoglossal artery on the right side which supplies the basilar. Patent basilar artery without stenosis. Patent SCA and PCA origins. Posterior communicating arteries are diminutive or absent. Left PCA branches appear within normal limits. On these images there is only mild right PCA P1 irregularity, but postcontrast MRA above indicated mild to moderate right P1 stenosis, with mild bilateral P2 segment irregularity. Distal PCA branch flow signal is symmetric. Antegrade flow in both ICA siphons with distal cavernous/anterior genu irregularity corresponding to the hemodynamically significant stenosis suspected above. Ophthalmic  artery origins remain patent. Both  carotid termini are patent. Right MCA and ACA origins are patent and within normal limits. Right A1, right M1, right MCA bifurcation, and visible right ACA and MCA branches are within normal limits. There is moderate stenosis at the left ACA origin. The left MCA origin appears mildly irregular. Both left M1 and A1 remain patent visible left ACA branches are within normal limits. Left MCA M1 and bifurcation are patent without stenosis. No left MCA branch occlusion is identified. IMPRESSION: 1. Patent Left ICA terminus but hemodynamically significant stenoses of BOTH ICA siphons at the distal cavernous/anterior genu level. 2. No large vessel occlusion identified. Left ACA remains patent with moderate origin stenosis. Left MCA is patent with mild origin stenosis. 3. Right side Persistent Hypoglossal Artery, rare anatomic variant, supplies the Basilar. Both vertebral arteries are diminutive, and the left terminates in PICA. 4. Mild left and mild to moderate stenosis bilateral PCA P1/P2. Electronically Signed   By: Odessa Fleming M.D.   On: 03/16/2021 04:59   MR BRAIN WO CONTRAST  Result Date: 03/16/2021 CLINICAL DATA:  Code stroke presentation yesterday. Right facial droop and aphasia. EXAM: MRI HEAD WITHOUT CONTRAST TECHNIQUE: Multiplanar, multiecho pulse sequences of the brain and surrounding structures were obtained without intravenous contrast. COMPARISON:  MRI study earlier same day.  CT studies done yesterday. FINDINGS: Brain: 3 punctate acute infarctions. One located at the left parietal vertex cortical brain. One located in the left corona radiata. One located along the cortical surface of the deep insula on the left. No large confluent infarction. There is an old small vessel infarction in the left para median pons. No focal cerebellar insult. Cerebral hemispheres show minimal small vessel change of the white matter. No mass, hemorrhage, hydrocephalus or extra-axial collection.  Vascular: Major vessels at the base of the brain show flow. Skull and upper cervical spine: Negative Sinuses/Orbits: Clear/normal Other: None IMPRESSION: Three punctate acute infarctions in the left MCA territory, 1 affecting the deep insular cortex on the left, 1 affecting the white matter of the corona radiography on the left and 1 affecting the left parietal vertex cortex. No large confluent infarction. No swelling or hemorrhage. Electronically Signed   By: Paulina Fusi M.D.   On: 03/16/2021 14:40   ECHOCARDIOGRAM COMPLETE  Result Date: 03/16/2021    ECHOCARDIOGRAM REPORT   Patient Name:   Leah Pierce Date of Exam: 03/16/2021 Medical Rec #:  [id number]            Height:       62.0 in Accession #:    [id number]           Weight:       129.6 lb Date of Birth:  17-Nov-1951            BSA:          1.590 m Patient Age:    69 years             BP:           134/97 mmHg Patient Gender: F                    HR:           90 bpm. Exam Location:  Inpatient Procedure: 2D Echo, Cardiac Doppler and Color Doppler Indications:    Stroke  History:        Patient has no prior history of Echocardiogram examinations.  Risk Factors:Dyslipidemia.  Sonographer:    Ross Ludwig RDCS (AE) Referring Phys: [name] IMPRESSIONS  1. Left ventricular ejection fraction, by estimation, is 30 to 35%. The left ventricle has moderately decreased function. The left ventricle demonstrates global hypokinesis. There is moderate concentric left ventricular hypertrophy. Left ventricular diastolic parameters are consistent with Grade III diastolic dysfunction (restrictive). Elevated left atrial pressure.  2. Right ventricular systolic function is normal. The right ventricular size is normal. There is normal pulmonary artery systolic pressure.  3. Left atrial size was severely dilated.  4. The mitral valve is myxomatous. Moderate to severe mitral valve regurgitation. There is mild holosystolic prolapse of the middle segment  of the anterior leaflet of the mitral valve.  5. The aortic valve is tricuspid. Aortic valve regurgitation is not visualized. No aortic stenosis is present. FINDINGS  Left Ventricle: Left ventricular ejection fraction, by estimation, is 30 to 35%. The left ventricle has moderately decreased function. The left ventricle demonstrates global hypokinesis. The left ventricular internal cavity size was normal in size. There is moderate concentric left ventricular hypertrophy. Left ventricular diastolic parameters are consistent with Grade III diastolic dysfunction (restrictive). Elevated left atrial pressure. Right Ventricle: The right ventricular size is normal. No increase in right ventricular wall thickness. Right ventricular systolic function is normal. There is normal pulmonary artery systolic pressure. The tricuspid regurgitant velocity is 2.46 m/s, and  with an assumed right atrial pressure of 3 mmHg, the estimated right ventricular systolic pressure is 27.2 mmHg. Left Atrium: Left atrial size was severely dilated. Right Atrium: Right atrial size was normal in size. Pericardium: There is no evidence of pericardial effusion. Mitral Valve: The mitral valve is myxomatous. There is mild holosystolic prolapse of the middle segment of the anterior leaflet of the mitral valve. There is moderate thickening of the mitral valve leaflet(s). Moderate to severe mitral valve regurgitation, with posteriorly-directed jet. MV peak gradient, 2.8 mmHg. The mean mitral valve gradient is 1.0 mmHg. Tricuspid Valve: The tricuspid valve is normal in structure. Tricuspid valve regurgitation is trivial. Aortic Valve: The aortic valve is tricuspid. Aortic valve regurgitation is not visualized. No aortic stenosis is present. Aortic valve peak gradient measures 4.4 mmHg. Aortic valve area, by VTI measures 1.56 cm. Pulmonic Valve: The pulmonic valve was grossly normal. Pulmonic valve regurgitation is not visualized. Aorta: The aortic root and  ascending aorta are structurally normal, with no evidence of dilitation. IAS/Shunts: There is right bowing of the interatrial septum, suggestive of elevated left atrial pressure. No atrial level shunt detected by color flow Doppler.  LEFT VENTRICLE PLAX 2D LVIDd:         4.30 cm     Diastology LVIDs:         3.20 cm     LV e' medial:    5.55 cm/s LV PW:         1.40 cm     LV E/e' medial:  18.4 LV IVS:        1.40 cm     LV e' lateral:   5.77 cm/s LVOT diam:     1.80 cm     LV E/e' lateral: 17.7 LV SV:         24 LV SV Index:   15 LVOT Area:     2.54 cm  LV Volumes (MOD) LV vol d, MOD A2C: 70.8 ml LV vol d, MOD A4C: 57.6 ml LV vol s, MOD A2C: 53.5 ml LV vol s, MOD A4C: 37.3 ml LV SV  MOD A2C:     17.3 ml LV SV MOD A4C:     57.6 ml RIGHT VENTRICLE RV Basal diam:  3.30 cm RV S prime:     9.03 cm/s TAPSE (M-mode): 9.0 cm RVSP:           27.2 mmHg LEFT ATRIUM           Index       RIGHT ATRIUM LA diam:      3.90 cm 2.45 cm/m  RA Pressure: 3.00 mmHg LA Vol (A2C): 83.4 ml 52.45 ml/m LA Vol (A4C): 70.5 ml 44.34 ml/m  AORTIC VALVE AV Area (Vmax):    1.54 cm AV Area (Vmean):   1.44 cm AV Area (VTI):     1.56 cm AV Vmax:           105.00 cm/s AV Vmean:          76.700 cm/s AV VTI:            0.153 m AV Peak Grad:      4.4 mmHg LVOT Vmax:         63.70 cm/s LVOT Vmean:        43.500 cm/s LVOT VTI:          0.094 m LVOT/AV VTI ratio: 0.61  AORTA Ao Root diam: 2.50 cm MITRAL VALVE                 TRICUSPID VALVE MV Area (PHT): 5.02 cm      TR Peak grad:   24.2 mmHg MV Area VTI:   1.37 cm      TR Vmax:        246.00 cm/s MV Peak grad:  2.8 mmHg      Estimated RAP:  3.00 mmHg MV Mean grad:  1.0 mmHg      RVSP:           27.2 mmHg MV Vmax:       0.84 m/s MV Vmean:      4770.0 cm/s   SHUNTS MV VTI:        0.17 m        Systemic VTI:  0.09 m MV PHT:        72.00 msec    Systemic Diam: 1.80 cm MV Decel Time: 151 msec MR Peak grad:    1.0 mmHg MR Vmax:         50.20 cm/s MR Vmean:        6400.0 cm/s MR PISA Nyquist: 0.4 m/s MR  PISA:         1.57 cm MR PISA Radius:  0.50 cm MV E velocity: 102.00 cm/s Rachelle Hora Croitoru MD Electronically signed by Thurmon Fair MD Signature Date/Time: 03/16/2021/6:22:19 PM    Final    CT HEAD CODE STROKE WO CONTRAST  Result Date: 03/15/2021 CLINICAL DATA:  Code stroke. Neuro deficit, acute, stroke suspected. Additional provided: Right facial droop, aphasia, stroke alert. EXAM: CT HEAD WITHOUT CONTRAST TECHNIQUE: Contiguous axial images were obtained from the base of the skull through the vertex without intravenous contrast. COMPARISON:  No pertinent prior exams available for comparison. FINDINGS: Brain: Mild cerebral and cerebellar atrophy. Chronic appearing lacunar infarct within the right thalamus (series 2, image 16) (series 5, image 36) (series 6, image 23). Small region of cortical calcification within the right occipital lobe. There is no acute intracranial hemorrhage. No acute demarcated cortical infarct. No extra-axial fluid collection. No evidence of intracranial mass. No midline shift. Vascular: No hyperdense  vessel.  Atherosclerotic calcifications. Skull: Normal. Negative for fracture or focal lesion. Sinuses/Orbits: Visualized orbits show no acute finding. Trace bilateral ethmoid sinus mucosal thickening at the imaged levels. ASPECTS (Alberta Stroke Program Early CT Score) - Ganglionic level infarction (caudate, lentiform nuclei, internal capsule, insula, M1-M3 cortex): 7 - Supraganglionic infarction (M4-M6 cortex): 3 Total score (0-10 with 10 being normal): 10 These results were communicated to Dr. Selina CooleyStack At 2:20 pmon 4/29/2022by text page via the [facility] messaging system. IMPRESSION: No evidence of acute intracranial abnormality.  ASPECTS is 10. Small region of cortical calcification within the right occipital lobe (PCA vascular territory). This is nonspecific, but may reflect dystrophic calcification at site of a chronic infarct. Chronic lacunar infarct within the right thalamus. Mild  generalized parenchymal atrophy. Electronically Signed   By: Jackey LogeKyle  Golden DO   On: 03/15/2021 14:24   VAS US LOWER EXTREMITY VENOUS (DVT)  Result Date: 03/16/2021  Lower Venous DVT Study Patient Name:  Leah Pierce  Date of Exam:   03/16/2021 Medical Rec #: [id number]             Accession #:    [id number] Date of Birth: [birth year]             Patient Gender: F Patient Age:   69Y [facility] Procedure:      VAS US LOWER EXTREMITY VENOUS (DVT) Referring Phys: [id number] Marvel PlanJINDONG XU --------------------------------------------------------------------------------  Indications: Stroke.  Comparison Study: No prior study on file Performing Technologist: Sherren Kernsandace Kanady RVS  Examination Guidelines: A complete evaluation includes B-mode imaging, spectral Doppler, color Doppler, and power Doppler as needed of all accessible portions of each vessel. Bilateral testing is considered an integral part of a complete examination. Limited examinations for reoccurring indications may be performed as noted. The reflux portion of the exam is performed with the patient in reverse Trendelenburg.  +---------+---------------+---------+-----------+----------+--------------+ RIGHT    CompressibilityPhasicitySpontaneityPropertiesThrombus Aging +---------+---------------+---------+-----------+----------+--------------+ CFV      Full           Yes      Yes                                 +---------+---------------+---------+-----------+----------+--------------+ SFJ      Full                                                        +---------+---------------+---------+-----------+----------+--------------+ FV Prox  Full                                                        +---------+---------------+---------+-----------+----------+--------------+ FV Mid   Full                                                         +---------+---------------+---------+-----------+----------+--------------+ FV DistalFull                                                        +---------+---------------+---------+-----------+----------+--------------+  PFV      Full                                                        +---------+---------------+---------+-----------+----------+--------------+ POP      Full           Yes      Yes                                 +---------+---------------+---------+-----------+----------+--------------+ PTV      Full                                                        +---------+---------------+---------+-----------+----------+--------------+ PERO     Full                                                        +---------+---------------+---------+-----------+----------+--------------+   +---------+---------------+---------+-----------+----------+--------------+ LEFT     CompressibilityPhasicitySpontaneityPropertiesThrombus Aging +---------+---------------+---------+-----------+----------+--------------+ CFV      Full           Yes      Yes                                 +---------+---------------+---------+-----------+----------+--------------+ SFJ      Full                                                        +---------+---------------+---------+-----------+----------+--------------+ FV Prox  Full                                                        +---------+---------------+---------+-----------+----------+--------------+ FV Mid   Full                                                        +---------+---------------+---------+-----------+----------+--------------+ FV DistalFull                                                        +---------+---------------+---------+-----------+----------+--------------+ PFV      Full                                                         +---------+---------------+---------+-----------+----------+--------------+  POP      Full           Yes      Yes                                 +---------+---------------+---------+-----------+----------+--------------+ PTV      Full                                                        +---------+---------------+---------+-----------+----------+--------------+ PERO     Full                                                        +---------+---------------+---------+-----------+----------+--------------+     Summary: BILATERAL: - No evidence of deep vein thrombosis seen in the lower extremities, bilaterally. -   *See table(s) above for measurements and observations. Electronically signed by Coral Else MD on 03/16/2021 at 4:17:58 PM.    Final    CT ANGIO HEAD CODE STROKE  Result Date: 03/15/2021 CLINICAL DATA:  Stroke.  Right facial droop, aphasia. EXAM: CT ANGIOGRAPHY HEAD AND NECK TECHNIQUE: Multidetector CT imaging of the head and neck was performed using the standard protocol during bolus administration of intravenous contrast. Multiplanar CT image reconstructions and MIPs were obtained to evaluate the vascular anatomy. Carotid stenosis measurements (when applicable) are obtained utilizing NASCET criteria, using the distal internal carotid diameter as the denominator. CONTRAST:  77mL OMNIPAQUE IOHEXOL 350 MG/ML SOLN COMPARISON:  None. FINDINGS: CTA NECK FINDINGS Aortic arch: Short common origin of the innominate and left common carotid artery from the aortic arch. Imaged portion shows no evidence of aneurysm or dissection. No significant stenosis of the major arch vessel origins. Right carotid system: Mild atherosclerotic changes of the right carotid bifurcation without hemodynamically significant stenosis. High cervical internal carotid artery bifurcation corresponding to a type 1 proatlantal artery. Left carotid system: Mild atherosclerotic changes of the left carotid bifurcation  without hemodynamically significant stenosis. Decrease caliber of the cervical left ICA related to intracranial occlusion. Vertebral arteries: Related to the presence of a right pro atlantal artery. No focal stenosis. Skeleton: Negative. Other neck: Prominent left palatine tonsil. Correlation with direct exam suggested. Upper chest: No acute findings. Review of the MIP images confirms the above findings CTA HEAD FINDINGS Anterior circulation: There is a left ICA terminus occlusion extending to the proximal left A1/ACA and M1/MCA ( "T" occlusion). There is good leptomeningeal collateral flow to the left MCA vascular tree and filling of the left ACA via anterior communicating artery. Atherosclerotic changes of the right carotid siphon without hemodynamically significant stenosis. The right ACA and MCA vascular trees are patent. Posterior circulation: Diminutive intracranial vertebral arteries mostly supplying the bilateral PICA territories. Right proatlantal artery with normal caliber. The basilar artery and bilateral posterior cerebral arteries are maintained. Venous sinuses: As permitted by contrast timing, patent. Anatomic variants: Type 1 right proatlantal artery. Review of the MIP images confirms the above findings IMPRESSION: 1. Left ICA terminus occlusion extending to the proximal left A1/ACA and M1/MCA ( T occlusion). There is good leptomeningeal collateral flow to the left MCA vascular tree and  filling of the left ACA via anterior communicating artery. 2. High cervical right internal carotid artery bifurcation corresponding to origin of a type 1 proatlantal artery. 3. Diminutive intracranial vertebral arteries mostly supplying the bilateral PICA territories. 4. Prominent left palatine tonsil. Correlation with direct exam suggested. These results were called by telephone at the time of interpretation on 03/15/2021 at 2:50 pm to provider Dr. Bing Neighbors, who verbally acknowledged these results. Electronically  Signed   By: Baldemar Lenis M.D.   On: 03/15/2021 15:07   CT ANGIO NECK CODE STROKE  Result Date: 03/15/2021 CLINICAL DATA:  Stroke.  Right facial droop, aphasia. EXAM: CT ANGIOGRAPHY HEAD AND NECK TECHNIQUE: Multidetector CT imaging of the head and neck was performed using the standard protocol during bolus administration of intravenous contrast. Multiplanar CT image reconstructions and MIPs were obtained to evaluate the vascular anatomy. Carotid stenosis measurements (when applicable) are obtained utilizing NASCET criteria, using the distal internal carotid diameter as the denominator. CONTRAST:  75mL OMNIPAQUE IOHEXOL 350 MG/ML SOLN COMPARISON:  None. FINDINGS: CTA NECK FINDINGS Aortic arch: Short common origin of the innominate and left common carotid artery from the aortic arch. Imaged portion shows no evidence of aneurysm or dissection. No significant stenosis of the major arch vessel origins. Right carotid system: Mild atherosclerotic changes of the right carotid bifurcation without hemodynamically significant stenosis. High cervical internal carotid artery bifurcation corresponding to a type 1 proatlantal artery. Left carotid system: Mild atherosclerotic changes of the left carotid bifurcation without hemodynamically significant stenosis. Decrease caliber of the cervical left ICA related to intracranial occlusion. Vertebral arteries: Related to the presence of a right pro atlantal artery. No focal stenosis. Skeleton: Negative. Other neck: Prominent left palatine tonsil. Correlation with direct exam suggested. Upper chest: No acute findings. Review of the MIP images confirms the above findings CTA HEAD FINDINGS Anterior circulation: There is a left ICA terminus occlusion extending to the proximal left A1/ACA and M1/MCA ( "T" occlusion). There is good leptomeningeal collateral flow to the left MCA vascular tree and filling of the left ACA via anterior communicating artery. Atherosclerotic  changes of the right carotid siphon without hemodynamically significant stenosis. The right ACA and MCA vascular trees are patent. Posterior circulation: Diminutive intracranial vertebral arteries mostly supplying the bilateral PICA territories. Right proatlantal artery with normal caliber. The basilar artery and bilateral posterior cerebral arteries are maintained. Venous sinuses: As permitted by contrast timing, patent. Anatomic variants: Type 1 right proatlantal artery. Review of the MIP images confirms the above findings IMPRESSION: 1. Left ICA terminus occlusion extending to the proximal left A1/ACA and M1/MCA ( T occlusion). There is good leptomeningeal collateral flow to the left MCA vascular tree and filling of the left ACA via anterior communicating artery. 2. High cervical right internal carotid artery bifurcation corresponding to origin of a type 1 proatlantal artery. 3. Diminutive intracranial vertebral arteries mostly supplying the bilateral PICA territories. 4. Prominent left palatine tonsil. Correlation with direct exam suggested. These results were called by telephone at the time of interpretation on 03/15/2021 at 2:50 pm to provider Dr. Bing Neighbors, who verbally acknowledged these results. Electronically Signed   By: Baldemar Lenis M.D.   On: 03/15/2021 15:07     Assessment and Plan:   1. Acute cardiomyopathy: patient presented with an acute stroke and was incidentally found to have a cardiomyopathy on echo this admission. EF 30-35%, G3DD, and moderate-severe MR with evidence of MVP. She reports family history of CHF in siblings  and MV disease in son. She denies any complaints of chest pain, SOB, DOE, palpitations, LE edema, orthopnea, or PND. She does have risk factors for CAD including HLD and pre-DM. Etiology of her cardiomyopathy remains unclear. Per neurology, goal SBP in the 130s-150s at this time given bilateral cavernous ICA stenosis. She does have evidence of JVD on  exam today - Nikia Levels give IV lasix 20mg  x1 and monitor for response - Hopeful to add GDMT including metoprolol succinate and entresto as she recovers from her CVA - Continue to monitor strict I&Os and daily weights - Continue to monitor electrolytes closely and replete as needed to maintain K >4, Mg >2.  - Anticipate need for an ischemic evaluation pending further work-up for her mitral valve disease - consider LHC vs coronary CTA.  - Lissette Schenk need repeat echo 3 months after initiation of GDMT to re-evaluate LV function and determine need for ICD for primary prevention  2. Mitral valve regurgitation: echo this admission with myxomatous valve with moderate-severe MR and mild holosystolic prolapse of the middle segment of the anterior leaflet. She denies recent SOB, DOE, LE edema, orthopnea, or PND.  - Artavis Cowie plan for TEE 03/18/21 to better assess mitral valve.  Shared Decision Making/Informed Consent{ The risks [esophageal damage, perforation (1:10,000 risk), bleeding, pharyngeal hematoma as well as other potential complications associated with conscious sedation including aspiration, arrhythmia, respiratory failure and death], benefits (treatment guidance and diagnostic support) and alternatives of a transesophageal echocardiogram were discussed in detail with Leah Pierce and she is willing to proceed.  - Allysia Ingles give 1x dose of IV lasix 20mg  as above and monitor for response.   3. CVA: patient presented as a Code Stroke with aphasia, right sided weakness, and right mouth droop. CT confirmed left ICA terminus occlusion; MRI brain with 3 punctate acute infarctions in the left MCA territory. She received tPA with resolution of symptoms. Work up also revealed bilateral cavernous ICA stenosis. She was started on aspirin 325mg  daily and plavix 75 mg daily with plans for a 3 month course, followed by aspirin monotherapy.  - Saveon Plant check TEE as above to evaluate for thombus - Anticipate 30-day monitor at discharge to  further evaluate for atrial fibrillation/flutter - she does have evidence of severe LAE on echo this admission though no complaints of palpitations - Continue aspirin and plavix per neurology - Continue statin - Allowing permissive HTN for now  4. HLD: LDL 118 this admission. Home atorvastatin increased from 20mg  daily to 40mg  daily - Continue atorvastatin - Roslin Norwood need repeat FLP/LFTs in 6-8 weeks  5. Pre-DM type 2: A1C 5.9 this admission.  - Continue dietary/lifestyle modifications to prevent progression       Risk Assessment/Risk Scores:        New York Heart Association (NYHA) Functional Class NYHA Class I        For questions or updates, please contact [facility] Please consult [email or web address] for contact info under    Signed, 05/18/21, PA-C  03/17/2021 10:43 AM  I have seen and examined this patient with .  Agree with above, note added to reflect my findings.  On exam, RRR, no murmurs.  Patient presented to the hospital with cryptogenic stroke with right sided mouth droop.  She received tPA.  Echo this admission shows an ejection fraction of 30 to 35% with a myxomatous mitral valve and moderate to severe MR.  She was also found to have a severely dilated left atrium.  We Arun Herrod schedule  her for TEE tomorrow.  She Davontay Watlington need optimal medical therapy for her heart failure, but Shima Compere hold off on starting this until neurology is okay treating her hypertension.  She does appear to be volume overloaded.  We Khya Halls diurese her with Lasix.  She Angelin Cutrone eventually need an ischemic evaluation, but this can be done as an outpatient potentially.  As she had a cryptogenic stroke, she Marybel Alcott need a 30-day monitor at discharge.  If her ejection fraction remains low, she Marlicia Sroka require ICD therapy and can evaluate for atrial fibrillation through the device.  Austin Herd M. Sholanda Croson MD 03/17/2021 1:35 PM

## 2021-03-17 NOTE — Plan of Care (Signed)

## 2021-03-17 NOTE — Progress Notes (Signed)
STROKE TEAM PROGRESS NOTE   INTERVAL HISTORY RN at the bedside. Pt no complains, eager to go home. No acute event overnight. MRI showed punctate left MCA infarct, MRA showed left t-ICA occlusion now patent but continued to have b/l cavernous ICA stenosis. EF only 30-35%, family hx of heart failure, will call cardiology consult.   OBJECTIVE Vitals:   03/17/21 0200 03/17/21 0300 03/17/21 0400 03/17/21 0500  BP: 125/83 126/85 125/90 (!) 127/100  Pulse: 80 83 76 83  Resp: 17 16 13 16   Temp:   98 F (36.7 C)   TempSrc:   Oral   SpO2: 99% 99% 99% 98%  Weight:      Height:        CBC:  Recent Labs  Lab 03/15/21 1404 03/15/21 1411 03/16/21 0548  WBC 6.6  --  6.4  NEUTROABS 3.3  --  3.6  HGB 13.5 14.3 13.2  HCT 43.0 42.0 40.0  MCV 86.0  --  82.3  PLT 203  --  191    Basic Metabolic Panel:  Recent Labs  Lab 03/15/21 1404 03/15/21 1411 03/16/21 0548  NA 137 138 139  K 4.0 3.9 3.9  CL 107 108 108  CO2 21*  --  21*  GLUCOSE 97 92 105*  BUN 13 14 11   CREATININE 1.04* 0.90 0.85  CALCIUM 9.2  --  8.8*    Lipid Panel:     Component Value Date/Time   CHOL 211 (H) 03/16/2021 0548   TRIG 69 03/16/2021 0548   HDL 79 03/16/2021 0548   CHOLHDL 2.7 03/16/2021 0548   VLDL 14 03/16/2021 0548   LDLCALC 118 (H) 03/16/2021 0548   HgbA1c:  Lab Results  Component Value Date   HGBA1C 5.9 (H) 03/16/2021   Urine Drug Screen:     Component Value Date/Time   LABOPIA NONE DETECTED 03/15/2021 1830   COCAINSCRNUR NONE DETECTED 03/15/2021 1830   LABBENZ NONE DETECTED 03/15/2021 1830   AMPHETMU NONE DETECTED 03/15/2021 1830   THCU POSITIVE (A) 03/15/2021 1830   LABBARB NONE DETECTED 03/15/2021 1830    Alcohol Level No results found for: ETH  IMAGING  MR ANGIO HEAD WO CONTRAST MR ANGIO NECK W WO CONTRAST 03/16/2021 IMPRESSION:  1. Patent Left ICA terminus but hemodynamically significant stenoses of BOTH ICA siphons at the distal cavernous/anterior genu level.  2. No large  vessel occlusion identified. Left ACA remains patent with moderate origin stenosis. Left MCA is patent with mild origin stenosis.  3. Right side Persistent Hypoglossal Artery, rare anatomic variant, supplies the Basilar. Both vertebral arteries are diminutive, and the left terminates in PICA.  4. Mild left and mild to moderate stenosis bilateral PCA P1/P2.   CT HEAD CODE STROKE WO CONTRAST 03/15/2021 IMPRESSION:  No evidence of acute intracranial abnormality.  ASPECTS is 10. Small region of cortical calcification within the right occipital lobe (PCA vascular territory). This is nonspecific, but may reflect dystrophic calcification at site of a chronic infarct. Chronic lacunar infarct within the right thalamus. Mild generalized parenchymal atrophy.   CT ANGIO HEAD CODE STROKE CT ANGIO NECK CODE STROKE 03/15/2021 IMPRESSION:  1. Left ICA terminus occlusion extending to the proximal left A1/ACA and M1/MCA ( T occlusion). There is good leptomeningeal collateral flow to the left MCA vascular tree and filling of the left ACA via anterior communicating artery.  2. High cervical right internal carotid artery bifurcation corresponding to origin of a type 1 proatlantal artery.  3. Diminutive intracranial vertebral arteries mostly supplying  the bilateral PICA territories.  4. Prominent left palatine tonsil. Correlation with direct exam suggested.  MRI Brain WO Contrast Three punctate acute infarctions in the left MCA territory, 1 affecting the deep insular cortex on the left, 1 affecting the white matter of the corona radiography on the left and 1 affecting the left parietal vertex cortex. No large confluent infarction. No swelling or hemorrhage.  Transthoracic Echocardiogram  1. Left ventricular ejection fraction, by estimation, is 30 to 35%. The  left ventricle has moderately decreased function. The left ventricle  demonstrates global hypokinesis. There is moderate concentric left  ventricular  hypertrophy. Left ventricular  diastolic parameters are consistent with Grade III diastolic dysfunction  (restrictive). Elevated left atrial pressure.  2. Right ventricular systolic function is normal. The right ventricular  size is normal. There is normal pulmonary artery systolic pressure.  3. Left atrial size was severely dilated.  4. The mitral valve is myxomatous. Moderate to severe mitral valve  regurgitation. There is mild holosystolic prolapse of the middle segment  of the anterior leaflet of the mitral valve.  5. The aortic valve is tricuspid. Aortic valve regurgitation is not  visualized. No aortic stenosis is present.   ECG - SR rate 93 BPM. (See cardiology reading for complete details)  PHYSICAL EXAM  Temp:  [97.9 F (36.6 C)-99.6 F (37.6 C)] 98 F (36.7 C) (05/01 0400) Pulse Rate:  [76-102] 83 (05/01 0500) Resp:  [13-34] 16 (05/01 0500) BP: (96-160)/(54-109) 127/100 (05/01 0500) SpO2:  [94 %-100 %] 98 % (05/01 0500)  General - Well nourished, well developed, in no apparent distress.  Ophthalmologic - fundi not visualized due to noncooperation.  Cardiovascular - Regular rhythm and rate.  Mental Status -  Level of arousal and orientation to time, place, and person were intact. Language including expression, naming, repetition, comprehension was assessed and found intact.  Cranial Nerves II - XII - II - Visual field intact OU. III, IV, VI - Extraocular movements intact. V - Facial sensation intact bilaterally. VII - Facial movement intact bilaterally. VIII - Hearing & vestibular intact bilaterally. X - Palate elevates symmetrically. XI - Chin turning & shoulder shrug intact bilaterally. XII - Tongue protrusion intact.  Motor Strength - The patient's strength was normal in all extremities and pronator drift was absent.  Bulk was normal and fasciculations were absent.   Motor Tone - Muscle tone was assessed at the neck and appendages and was  normal.  Reflexes - The patient's reflexes were symmetrical in all extremities and she had no pathological reflexes.  Sensory - Light touch, temperature/pinprick were assessed and were symmetrical.    Coordination - The patient had normal movements in the hands with no ataxia or dysmetria.  Tremor was absent.  Gait and Station - deferred.    ASSESSMENT/PLAN Leah Pierce is a 70 y.o. female with history of hyperlipidemia, migraines, prediabetes, and asthma who presented to the ED today for evaluation of acute onset of aphasia, mild right facial droop, RLE drift and possible confusion. CTA revealed left ICA terminus occlusion extending to the proximal left A1/ACA and M1/MCA. The patient received IV t-PA Friday 03/15/21 at 1545.  Stroke: Left MCA punctate infarct due to left terminal ICA occlusion status post tPA, etiology likely due to large vessel disease vs. cardiomyopathy with low EF  CT Head -  No evidence of acute intracranial abnormality.  ASPECTS is 10. Chronic lacunar infarct within the right thalamus.    CTA H&N - Left ICA terminus  occlusion extending to the proximal left A1/ACA and M1/MCA ( T occlusion). There is good leptomeningeal collateral flow to the left MCA vascular tree and filling of the left ACA via anterior communicating artery. High cervical right internal carotid artery bifurcation corresponding to origin of a type 1 proatlantal artery. Diminutive intracranial vertebral arteries mostly supplying the bilateral PICA territories.   MRA H&N - Patent Left ICA terminus but hemodynamically significant stenoses of BOTH ICA siphons at the distal cavernous/anterior genu level. No large vessel occlusion identified. Left ACA remains patent with moderate origin stenosis. Left MCA is patent with mild origin stenosis. Right side Persistent Hypoglossal Artery, rare anatomic variant, supplies the Basilar. Both vertebral arteries are diminutive, and the left terminates in PICA.  Mild left and mild to moderate stenosis bilateral PCA P1/P2.   MRI head - 3 punctate left MCA infarcts  2D Echo - EF 30 to 35%  Will consider 30-day cardiac event monitoring to rule out A. fib as outpatient  LE venous doppler - no DVT  Loyal Jacobson Virus 2 - negative  LDL - 118  HgbA1c - 5.9  UDS - THCU   VTE prophylaxis - SCDs  No antithrombotic prior to admission, now on aspirin 325 and Plavix 75 DAPT for 3 months and then aspirin alone.  Patient will be counseled to be compliant with her antithrombotic medications  Ongoing aggressive stroke risk factor management  Therapy recommendations: Outpatient OT - No f/u PT  Disposition:  Pending  Carotid stenosis  CTA H&N - Left ICA terminus occlusion extending to the proximal left A1/ACA and M1/MCA ( T occlusion).   MRA H&N - Patent Left ICA terminus but hemodynamically significant stenoses of BOTH ICA siphons at the distal cavernous/anterior genu level.  On aspirin 325 and Plavix 75 DAPT for 3 months  Cardiomyopathy  2D echo showed EF 30 to 35%  Could be potential etiology for embolic infarct  Family hx of heart failure (brothers and dad)  Will consult cardiology today  Hyperlipidemia  Home Lipid lowering medication: Lipitor 20 mg daily   LDL 118, goal < 70  Current lipid lowering medication: Lipitor 40 mg daily   Continue statin at discharge  THC abuse  Patient stated that she started using THC since she was 70 years old  Long-term THC use also a risk factor for stroke  Cessation education provided  Patient is willing to quit  Other Stroke Risk Factors  Advanced age  ETOH use, advised to drink no more than 1 alcoholic beverage per day.  Family hx stroke (mother)   Hx stroke/TIA - by imaging   Hx of migraines  Other Active Problems, Findings, Recommendations and/or Plan  Code status - Full code    Hospital day # 2  This patient is critically ill due to left MCA infarct with left t-ICA  occlusion s/p tPA, cardiomyopathy, b/l cavernous ICA stenosis and at significant risk of neurological worsening, death form recurrent stroke, heart failure, hemorrhagic  Conversion, seizure. This patient's care requires constant monitoring of vital signs, hemodynamics, respiratory and cardiac monitoring, review of multiple databases, neurological assessment, discussion with family, other specialists and medical decision making of high complexity. I spent 30 minutes of neurocritical care time in the care of this patient.  Marvel Plan, MD PhD Stroke Neurology 03/17/2021 9:36 AM     To contact Stroke Continuity provider, please refer to [email or web address]. After hours, contact General Neurology

## 2021-03-17 NOTE — Progress Notes (Signed)
Patient transferred to 3W at this time without complications. Report given to RN. Belongings include: Conservation officer, historic buildings

## 2021-03-18 ENCOUNTER — Encounter (HOSPITAL_COMMUNITY)
Admission: EM | Disposition: A | Discharge status: Home / Self Care | Payer: Self-pay | Source: Home / Self Care | Attending: Neurology

## 2021-03-18 ENCOUNTER — Inpatient Hospital Stay (HOSPITAL_COMMUNITY): Payer: Medicare Other | Admitting: Certified Registered Nurse Anesthetist

## 2021-03-18 ENCOUNTER — Encounter (HOSPITAL_COMMUNITY): Payer: Self-pay | Admitting: Neurology

## 2021-03-18 ENCOUNTER — Inpatient Hospital Stay (HOSPITAL_COMMUNITY): Payer: Medicare Other

## 2021-03-18 DIAGNOSIS — I639 Cerebral infarction, unspecified: Secondary | ICD-10-CM

## 2021-03-18 DIAGNOSIS — I6523 Occlusion and stenosis of bilateral carotid arteries: Secondary | ICD-10-CM | POA: Diagnosis not present

## 2021-03-18 DIAGNOSIS — I255 Ischemic cardiomyopathy: Secondary | ICD-10-CM | POA: Diagnosis not present

## 2021-03-18 DIAGNOSIS — I340 Nonrheumatic mitral (valve) insufficiency: Secondary | ICD-10-CM

## 2021-03-18 HISTORY — PX: BUBBLE STUDY: SHX6837

## 2021-03-18 HISTORY — PX: TEE WITHOUT CARDIOVERSION: SHX5443

## 2021-03-18 LAB — ECHO TEE
MV M vel: 4.17 m/s
MV Peak grad: 69.6 mmHg
Radius: 0.6 cm

## 2021-03-18 LAB — CBC
HCT: 42.4 % (ref 36.0–46.0)
Hemoglobin: 14.2 g/dL (ref 12.0–15.0)
MCH: 27.5 pg (ref 26.0–34.0)
MCHC: 33.5 g/dL (ref 30.0–36.0)
MCV: 82.2 fL (ref 80.0–100.0)
Platelets: 195 10*3/uL (ref 150–400)
RBC: 5.16 MIL/uL — ABNORMAL HIGH (ref 3.87–5.11)
RDW: 14.1 % (ref 11.5–15.5)
WBC: 7.3 10*3/uL (ref 4.0–10.5)
nRBC: 0 % (ref 0.0–0.2)

## 2021-03-18 LAB — GLUCOSE, CAPILLARY
Glucose-Capillary: 120 mg/dL — ABNORMAL HIGH (ref 70–99)
Glucose-Capillary: 102 mg/dL — ABNORMAL HIGH (ref 70–99)
Glucose-Capillary: 105 mg/dL — ABNORMAL HIGH (ref 70–99)
Glucose-Capillary: 101 mg/dL — ABNORMAL HIGH (ref 70–99)
Glucose-Capillary: 131 mg/dL — ABNORMAL HIGH (ref 70–99)

## 2021-03-18 LAB — BASIC METABOLIC PANEL
Anion gap: 10 (ref 5–15)
BUN: 13 mg/dL (ref 8–23)
CO2: 25 mmol/L (ref 22–32)
Calcium: 9.2 mg/dL (ref 8.9–10.3)
Chloride: 104 mmol/L (ref 98–111)
Creatinine, Ser: 0.8 mg/dL (ref 0.44–1.00)
GFR, Estimated: >60 mL/min (ref >60)
Glucose, Bld: 104 mg/dL — ABNORMAL HIGH (ref 70–99)
Potassium: 3.9 mmol/L (ref 3.5–5.1)
Sodium: 139 mmol/L (ref 135–145)

## 2021-03-18 SURGERY — ECHOCARDIOGRAM, TRANSESOPHAGEAL
Anesthesia: Monitor Anesthesia Care

## 2021-03-18 MED ORDER — LIDOCAINE 2% (20 MG/ML) 5 ML SYRINGE
INTRAMUSCULAR | Status: DC | PRN
  Administered 2021-03-18: 60 mg via INTRAVENOUS

## 2021-03-18 MED ORDER — PROPOFOL 10 MG/ML IV BOLUS
INTRAVENOUS | Status: DC | PRN
  Administered 2021-03-18: 20 mg via INTRAVENOUS
  Administered 2021-03-18: 30 mg via INTRAVENOUS

## 2021-03-18 MED ORDER — PROPOFOL 500 MG/50ML IV EMUL
INTRAVENOUS | Status: DC | PRN
  Administered 2021-03-18: 75 ug/kg/min via INTRAVENOUS

## 2021-03-18 MED ORDER — SODIUM CHLORIDE 0.9 % IV SOLN: INTRAVENOUS | Status: DC

## 2021-03-18 NOTE — Progress Notes (Signed)
   03/18/21 1037  Clinical Encounter Type  Visited With Patient  Visit Type Initial  Referral From Nurse  Consult/Referral To Chaplain   Chaplain responded to consult for HCPOA. Pt stated she was tired at the moment, but would look over the paperwork later. Chaplain told her she could reach out again through her nurse if she has any questions. Chaplain remains available.  This note was prepared by Chaplain Resident, Tacy Learn, MDiv. For questions, please contact by phone at [phone].

## 2021-03-18 NOTE — Anesthesia Procedure Notes (Signed)
Procedure Name: MAC Date/Time: 03/18/2021 12:16 PM Performed by: Colin Benton, CRNA Pre-anesthesia Checklist: Patient identified, Emergency Drugs available, Suction available and Patient being monitored Patient Re-evaluated:Patient Re-evaluated prior to induction Oxygen Delivery Method: Nasal cannula Induction Type: IV induction Placement Confirmation: positive ETCO2 Dental Injury: Teeth and Oropharynx as per pre-operative assessment

## 2021-03-18 NOTE — Plan of Care (Signed)

## 2021-03-18 NOTE — Care Management Important Message (Signed)
Important Message  Patient Details  Name: Leah Pierce MRN: [id number] Date of Birth: 03/28/1951   Medicare Important Message Given:  Yes     Dorena Bodo 03/18/2021, 3:44 PM

## 2021-03-18 NOTE — Progress Notes (Signed)
OT Cancellation Note  Patient Details Name: Leah Pierce MRN: [id number] DOB: 01/23/1951   Cancelled Treatment:    Reason Eval/Treat Not Completed: Patient declined, no reason specified;Other (comment) pt requests to finish lunch tray prior to OT session, will check back as time allows for OT session.   Lenor Derrick., COTA/L Acute Rehabilitation Services [phone]   Barron Schmid 03/18/2021, 4:58 PM

## 2021-03-18 NOTE — Transfer of Care (Signed)
Immediate Anesthesia Transfer of Care Note  Patient: Leah Pierce  Procedure(s) Performed: TRANSESOPHAGEAL ECHOCARDIOGRAM (TEE) (N/A ) BUBBLE STUDY  Patient Location: Endoscopy Unit  Anesthesia Type:MAC  Level of Consciousness: drowsy and patient cooperative  Airway & Oxygen Therapy: Patient Spontanous Breathing and Patient connected to nasal cannula oxygen  Post-op Assessment: Report given to RN and Post -op Vital signs reviewed and stable  Post vital signs: Reviewed and stable  Last Vitals:  Vitals Value Taken Time  BP 96/61 03/18/21 1245  Temp 37.1 C 03/18/21 1242  Pulse 92 03/18/21 1246  Resp 18 03/18/21 1246  SpO2 94 % 03/18/21 1246  Vitals shown include unvalidated device data.  Last Pain:  Vitals:   03/18/21 1242  TempSrc: Axillary  PainSc: 0-No pain         Complications: No complications documented.

## 2021-03-18 NOTE — Progress Notes (Addendum)
    Transesophageal Echocardiogram Note  Leah Pierce [id number] 06-28-1951  Procedure: Transesophageal Echocardiogram Indications: mitral regurgitation  Procedure Details Consent: Obtained Time Out: Verified patient identification, verified procedure, site/side was marked, verified correct patient position, special equipment/implants available, Radiology Safety Procedures followed,  medications/allergies/relevent history reviewed, required imaging and test results available.  Performed  Medications:  Pt sedated by anesthesia with diprovan 143 mg IV total.   Moderate to severe global reduction in LV systolic function; severe LAE; no LAA thrombus; thickened MV with mild prolapse of anterior MV leaftle; moderate to severe MR; late positive saline microcavitation study suggestive of intrapulmonary shunt.   Complications: No apparent complications Patient did tolerate procedure well.  Olga Millers, MD

## 2021-03-18 NOTE — Progress Notes (Addendum)
STROKE TEAM PROGRESS NOTE   INTERVAL HISTORY RN at the bedside. Pt no complains, eager to go home. No acute event overnight. MRI showed punctate left MCA infarct, MRA showed left t-ICA occlusion now patent but continued to have b/l cavernous ICA stenosis. EF only 30-35%,cardiology consulted and now s/p TEE today. Currently she is reporting TEE went well and she is without new symptoms or concerns. She is hoping to be discharged home tomorrow. Thankful for everyone's care.  She understands from her conversation with cardiologist her TEE results and that she will need to start some new medications due to heart failure. Plan of care discussed. She had no questions at this point.   OBJECTIVE Vitals:   03/18/21 1243 03/18/21 1246 03/18/21 1252 03/18/21 1305  BP: (!) 81/52 96/61 (!) 105/57 (!) 144/95  Pulse: 92 92 85 83  Resp: 20 18 17 20   Temp:      TempSrc:      SpO2: 94% 94% 92% 99%  Weight:      Height:        CBC:  Recent Labs  Lab 03/15/21 1404 03/15/21 1411 03/16/21 0548 03/18/21 0325  WBC 6.6  --  6.4 7.3  NEUTROABS 3.3  --  3.6  --   HGB 13.5   < > 13.2 14.2  HCT 43.0   < > 40.0 42.4  MCV 86.0  --  82.3 82.2  PLT 203  --  191 195   < > = values in this interval not displayed.    Basic Metabolic Panel:  Recent Labs  Lab 03/16/21 0548 03/18/21 0325  NA 139 139  K 3.9 3.9  CL 108 104  CO2 21* 25  GLUCOSE 105* 104*  BUN 11 13  CREATININE 0.85 0.80  CALCIUM 8.8* 9.2    Lipid Panel:     Component Value Date/Time   CHOL 211 (H) 03/16/2021 0548   TRIG 69 03/16/2021 0548   HDL 79 03/16/2021 0548   CHOLHDL 2.7 03/16/2021 0548   VLDL 14 03/16/2021 0548   LDLCALC 118 (H) 03/16/2021 0548   HgbA1c:  Lab Results  Component Value Date   HGBA1C 5.9 (H) 03/16/2021   Urine Drug Screen:     Component Value Date/Time   LABOPIA NONE DETECTED 03/15/2021 1830   COCAINSCRNUR NONE DETECTED 03/15/2021 1830   LABBENZ NONE DETECTED 03/15/2021 1830   AMPHETMU NONE  DETECTED 03/15/2021 1830   THCU POSITIVE (A) 03/15/2021 1830   LABBARB NONE DETECTED 03/15/2021 1830    Alcohol Level No results found for: ETH  IMAGING and Diagnostics  MR ANGIO HEAD WO CONTRAST MR ANGIO NECK W WO CONTRAST 03/16/2021 IMPRESSION:  1. Patent Left ICA terminus but hemodynamically significant stenoses of BOTH ICA siphons at the distal cavernous/anterior genu level.  2. No large vessel occlusion identified. Left ACA remains patent with moderate origin stenosis. Left MCA is patent with mild origin stenosis.  3. Right side Persistent Hypoglossal Artery, rare anatomic variant, supplies the Basilar. Both vertebral arteries are diminutive, and the left terminates in PICA.  4. Mild left and mild to moderate stenosis bilateral PCA P1/P2.   CT HEAD CODE STROKE WO CONTRAST 03/15/2021 IMPRESSION:  No evidence of acute intracranial abnormality.  ASPECTS is 10. Small region of cortical calcification within the right occipital lobe (PCA vascular territory). This is nonspecific, but may reflect dystrophic calcification at site of a chronic infarct. Chronic lacunar infarct within the right thalamus. Mild generalized parenchymal atrophy.   CT ANGIO HEAD  CODE STROKE CT ANGIO NECK CODE STROKE 03/15/2021 IMPRESSION:  1. Left ICA terminus occlusion extending to the proximal left A1/ACA and M1/MCA ( T occlusion). There is good leptomeningeal collateral flow to the left MCA vascular tree and filling of the left ACA via anterior communicating artery.  2. High cervical right internal carotid artery bifurcation corresponding to origin of a type 1 proatlantal artery.  3. Diminutive intracranial vertebral arteries mostly supplying the bilateral PICA territories.  4. Prominent left palatine tonsil. Correlation with direct exam suggested.  MRI Brain WO Contrast Three punctate acute infarctions in the left MCA territory, 1 affecting the deep insular cortex on the left, 1 affecting the white matter of  the corona radiography on the left and 1 affecting the left parietal vertex cortex. No large confluent infarction. No swelling or hemorrhage.  Transthoracic Echocardiogram  1. Left ventricular ejection fraction, by estimation, is 30 to 35%. The  left ventricle has moderately decreased function. The left ventricle  demonstrates global hypokinesis. There is moderate concentric left  ventricular hypertrophy. Left ventricular  diastolic parameters are consistent with Grade III diastolic dysfunction  (restrictive). Elevated left atrial pressure.  2. Right ventricular systolic function is normal. The right ventricular  size is normal. There is normal pulmonary artery systolic pressure.  3. Left atrial size was severely dilated.  4. The mitral valve is myxomatous. Moderate to severe mitral valve  regurgitation. There is mild holosystolic prolapse of the middle segment  of the anterior leaflet of the mitral valve.  5. The aortic valve is tricuspid. Aortic valve regurgitation is not  visualized. No aortic stenosis is present.   ECG - SR rate 93 BPM. (See cardiology reading for complete details)  TEE Moderate to severe global reduction in LV systolic function; severe LAE; no LAA thrombus; thickened MV with mild prolapse of anterior MV leaftle; moderate to severe MR; late positive saline microcavitation study suggestive of intrapulmonary shunt.  PHYSICAL EXAM  Temp:  [97.4 F (36.3 C)-98.7 F (37.1 C)] 98.7 F (37.1 C) (05/02 1242) Pulse Rate:  [83-97] 83 (05/02 1305) Resp:  [17-21] 20 (05/02 1305) BP: (81-144)/(52-98) 144/95 (05/02 1305) SpO2:  [92 %-100 %] 99 % (05/02 1305) Weight:  [62.1 kg] 62.1 kg (05/02 0542)  General - Well nourished, well developed, in no apparent distress.Marland Kitchen Resp: No extra work of breathing Cardiovascular - Regular rhythm and rate. Ext: No edema   Mental Status -  Alert, oriented x4. Clear and fluent speech. Conversant and in good spirits today. Naming  and repetition are intact.  Cranial Nerves II - XII - II - Visual field intact OU. III, IV, VI - Extraocular movements intact. V - Facial sensation intact bilaterally. VII - Facial movement intact bilaterally. VIII - Hearing & vestibular intact bilaterally. X - Palate elevates symmetrically. XI - Chin turning & shoulder shrug intact bilaterally. XII - Tongue protrusion intact.  Motor Strength - The patient's strength was normal in all extremities and pronator drift was absent.  Bulk was normal and fasciculations were absent.   Motor Tone - Muscle tone was assessed at the neck and appendages and was normal.  Reflexes - The patient's reflexes were symmetrical in all extremities and she had no pathological reflexes.  Sensory - Light touch, temperature/pinprick were assessed and were symmetrical.    Coordination - The patient had normal movements in the hands with no ataxia or dysmetria.  Tremor was absent.  Gait and Station - deferred.   ASSESSMENT/PLAN [name] is a  70 y.o. female with history of hyperlipidemia, migraines, prediabetes, and asthma who presented to the ED today for evaluation of acute onset of aphasia, mild right facial droop, RLE drift and possible confusion. CTA revealed left ICA terminus occlusion extending to the proximal left A1/ACA and M1/MCA. The patient received IV t-PA Friday 03/15/21 at 1545.  Stroke: Left MCA punctate infarct due to left terminal ICA occlusion status post tPA, etiology likely due to large vessel disease vs. cardiomyopathy with low EF  CT Head -  No evidence of acute intracranial abnormality.  ASPECTS is 10. Chronic lacunar infarct within the right thalamus.    CTA H&N - Left ICA terminus occlusion extending to the proximal left A1/ACA and M1/MCA ( T occlusion). There is good leptomeningeal collateral flow to the left MCA vascular tree and filling of the left ACA via anterior communicating artery. High cervical right internal  carotid artery bifurcation corresponding to origin of a type 1 proatlantal artery. Diminutive intracranial vertebral arteries mostly supplying the bilateral PICA territories.   MRA H&N - Patent Left ICA terminus but hemodynamically significant stenoses of BOTH ICA siphons at the distal cavernous/anterior genu level. No large vessel occlusion identified. Left ACA remains patent with moderate origin stenosis. Left MCA is patent with mild origin stenosis. Right side Persistent Hypoglossal Artery, rare anatomic variant, supplies the Basilar. Both vertebral arteries are diminutive, and the left terminates in PICA. Mild left and mild to moderate stenosis bilateral PCA P1/P2.   MRI head - 3 punctate left MCA infarcts  2D Echo - EF 30 to 35%  TEE- Moderate to severe global reduction in LV systolic function; severe LAE; no LAA thrombus  Will consider 30-day cardiac event monitoring to rule out A. fib as outpatient if no AC indicated this time  LE venous doppler - no DVT  Loyal Jacobson Virus 2 - negative  LDL - 118  HgbA1c - 5.9  UDS - THCU   VTE prophylaxis - SCDs  No antithrombotic prior to admission, now on aspirin 325 and Plavix 75 DAPT for 3 months. Will discuss with cardiology to see if [facility] indicated  Patient will be counseled to be compliant with her antithrombotic medications  Ongoing aggressive stroke risk factor management  Therapy recommendations: Outpatient OT  Disposition:  Pending    Carotid stenosis  CTA H&N - Left ICA terminus occlusion extending to the proximal left A1/ACA and M1/MCA ( T occlusion).   MRA H&N - Patent Left ICA terminus but hemodynamically significant stenoses of BOTH ICA siphons at the distal cavernous/anterior genu level.  Currently on aspirin 325 and Plavix 75 DAPT for 3 months  Cardiomyopathy  2D echo showed EF 30 to 35%  Could be potential etiology for embolic infarct  Family hx of heart failure (brothers and dad)  TEE- Moderate to severe  global reduction in LV systolic function; severe LAE; no LAA thrombus; thickened MV with mild prolapse of anterior MV leaftle; moderate to severe MR   Cardiology recommending GDMT, including metoprolol succinate and entresto, and repeat echo in 3 months, will discuss with cardiology to see if [facility] indicated.   Hyperlipidemia  Home Lipid lowering medication: Lipitor 20 mg daily   LDL 118, goal < 70  Current lipid lowering medication: Lipitor 40 mg daily   Continue statin at discharge  THC abuse  Patient stated that she started using THC since she was 70 years old  Long-term THC use also a risk factor for stroke  Cessation education provided  Patient is  willing to quit  Other Stroke Risk Factors  Advanced age  ETOH use, advised to drink no more than 1 alcoholic beverage per day.  Family hx stroke (mother)   Hx stroke/TIA - by imaging   Hx of migraines  Other Active Problems  Code status - Full code    Hospital day # 3  This plan of care was directed by Dr. Roda Shutters.  Janey Genta  ATTENDING NOTE: I reviewed above note and agree with the assessment and plan. Pt was seen and examined.   Patient awake alert, laying in bed, fully orientated, neurological exam intact.  Patient just came back from TEE, which showed moderate to severe decreased LV function, severe LAE, but no LAA thrombus.  Cardiology recommend GDMT once BP stable.  Currently on aspirin and Plavix, will discuss with cardiology regarding indication of AC at this time.  Patient will follow up with cardiology as outpatient.  Continue Lipitor.  Plan for DC tomorrow once plan finalized.  For detailed assessment and plan, please refer to above as I have made changes wherever appropriate.   Marvel Plan, MD PhD Stroke Neurology 03/18/2021 4:54 PM      To contact Stroke Continuity provider, please refer to [email or web address]. After hours, contact General Neurology

## 2021-03-18 NOTE — Interval H&P Note (Signed)
History and Physical Interval Note:  03/18/2021 12:04 PM  Leah Pierce  has presented today for surgery, with the diagnosis of stroke.  The various methods of treatment have been discussed with the patient and family. After consideration of risks, benefits and other options for treatment, the patient has consented to  Procedure(s): TRANSESOPHAGEAL ECHOCARDIOGRAM (TEE) (N/A) as a surgical intervention.  The patient's history has been reviewed, patient examined, no change in status, stable for surgery.  I have reviewed the patient's chart and labs.  Questions were answered to the patient's satisfaction.     Olga Millers

## 2021-03-18 NOTE — Progress Notes (Signed)
  Echocardiogram Echocardiogram Transesophageal has been performed.  Leah Pierce 03/18/2021, 1:07 PM

## 2021-03-18 NOTE — TOC Transition Note (Signed)
Transition of Care [facility]) - CM/SW Discharge Note   Patient Details  Name: Leah Pierce MRN: [id number] Date of Birth: Feb 27, 1951  Transition of Care [facility]) CM/SW Contact:  Kermit Balo, RN Phone Number: 03/18/2021, 2:49 PM   Clinical Narrative:    Recommendations for outpatient OT. Pt would like to attend [employer]. Orders in Epic and information on the AVS.  Pt lives alone but states she has local family that can check in on her.  She was not on any medications at home prior to the hospital but feels she will have no issues with prescribed meds.  No DME needs.  Pt drives self and does have transport home when medically ready for d/c home.    Final next level of care: OP Rehab Barriers to Discharge: No Barriers Identified   Patient Goals and CMS Choice     Choice offered to / list presented to : Patient  Discharge Placement                       Discharge Plan and Services                                     Social Determinants of Health (SDOH) Interventions     Readmission Risk Interventions No flowsheet data found.

## 2021-03-18 NOTE — Anesthesia Preprocedure Evaluation (Signed)
Anesthesia Evaluation  Patient identified by MRN, date of birth, ID band Patient awake    Reviewed: Allergy & Precautions, NPO status , Patient's Chart, lab work & pertinent test results  Airway Mallampati: I  TM Distance: >3 FB Neck ROM: Full    Dental no notable dental hx.    Pulmonary asthma ,    Pulmonary exam normal breath sounds clear to auscultation       Cardiovascular Normal cardiovascular exam+ Valvular Problems/Murmurs  Rhythm:Regular Rate:Normal     Neuro/Psych  Headaches, CVA negative psych ROS   GI/Hepatic negative GI ROS, (+)     substance abuse  ,   Endo/Other  negative endocrine ROS  Renal/GU negative Renal ROS     Musculoskeletal  (+) Arthritis ,   Abdominal   Peds  Hematology HLD   Anesthesia Other Findings stroke  Reproductive/Obstetrics                             Anesthesia Physical Anesthesia Plan  ASA: III  Anesthesia Plan: MAC   Post-op Pain Management:    Induction: Intravenous  PONV Risk Score and Plan: 2 and Propofol infusion and Treatment may vary due to age or medical condition  Airway Management Planned: Nasal Cannula  Additional Equipment:   Intra-op Plan:   Post-operative Plan:   Informed Consent: I have reviewed the patients History and Physical, chart, labs and discussed the procedure including the risks, benefits and alternatives for the proposed anesthesia with the patient or authorized representative who has indicated his/her understanding and acceptance.     Dental advisory given  Plan Discussed with: CRNA  Anesthesia Plan Comments:         Anesthesia Quick Evaluation

## 2021-03-18 NOTE — Anesthesia Postprocedure Evaluation (Signed)
Anesthesia Post Note  Patient: Leah Pierce  Procedure(s) Performed: TRANSESOPHAGEAL ECHOCARDIOGRAM (TEE) (N/A ) BUBBLE STUDY     Patient location during evaluation: Endoscopy Anesthesia Type: MAC Level of consciousness: awake Pain management: pain level controlled Vital Signs Assessment: post-procedure vital signs reviewed and stable Respiratory status: spontaneous breathing, nonlabored ventilation, respiratory function stable and patient connected to nasal cannula oxygen Cardiovascular status: stable and blood pressure returned to baseline Postop Assessment: no apparent nausea or vomiting Anesthetic complications: no   No complications documented.  Last Vitals:  Vitals:   03/18/21 1252 03/18/21 1305  BP: (!) 105/57 (!) 144/95  Pulse: 85 83  Resp: 17 20  Temp:    SpO2: 92% 99%    Last Pain:  Vitals:   03/18/21 1305  TempSrc:   PainSc: 0-No pain                 Jamarious Febo P Osmara Drummonds

## 2021-03-18 NOTE — Progress Notes (Signed)
Progress Note  Patient Name: Leah Pierce Date of Encounter: 03/18/2021  Primary Cardiologist: No primary care provider on file.   Subjective   Patient seen in endoscopy prior to TEE. No CP or SOB, we discussed why we are performing TEE.  Inpatient Medications    Scheduled Meds: . [MAR Hold] vitamin C  500 mg Oral Daily  . [MAR Hold] aspirin EC  325 mg Oral Daily  . [MAR Hold] atorvastatin  40 mg Oral Daily  . [MAR Hold] budesonide (PULMICORT) nebulizer solution  0.25 mg Nebulization BID  . [MAR Hold] Chlorhexidine Gluconate Cloth  6 each Topical Daily  . [MAR Hold] cholecalciferol  1,000 Units Oral Daily  . [MAR Hold] clopidogrel  75 mg Oral Daily  . [MAR Hold] insulin aspart  0-9 Units Subcutaneous TID WC  . [MAR Hold] sodium chloride flush  3 mL Intravenous Once   Continuous Infusions: . [MAR Hold] sodium chloride    . sodium chloride 125 mL/hr at 03/18/21 1130   PRN Meds: [MAR Hold] acetaminophen **OR** [MAR Hold] acetaminophen (TYLENOL) oral liquid 160 mg/5 mL **OR** [MAR Hold] acetaminophen, [MAR Hold] albuterol, [MAR Hold] labetalol, [MAR Hold] melatonin, [MAR Hold] senna-docusate   Vital Signs    Vitals:   03/18/21 0542 03/18/21 0747 03/18/21 0755 03/18/21 1127  BP:   (!) 126/95 (!) 140/93  Pulse:   87 91  Resp:   20 20  Temp:   97.8 F (36.6 C) (!) 97.4 F (36.3 C)  TempSrc:   Oral Oral  SpO2:  96% 99% 100%  Weight: 62.1 kg     Height:        Intake/Output Summary (Last 24 hours) at 03/18/2021 1154 Last data filed at 03/18/2021 0100 Gross per 24 hour  Intake --  Output 450 ml  Net -450 ml   Filed Weights   03/15/21 1400 03/15/21 1450 03/18/21 0542  Weight: 58.8 kg 58.8 kg 62.1 kg    Telemetry    SR - Personally Reviewed  ECG    No new - Personally Reviewed  Physical Exam   GEN: No acute distress.   Neck: No JVD Cardiac: regular rhythm, normal rate, 2/6 HSM Respiratory: Clear to auscultation bilaterally. GI: Soft, nontender,  non-distended  MS: No edema; No deformity. Neuro:  a + o x 3 Psych: Normal affect   Labs    Chemistry Recent Labs  Lab 03/15/21 1404 03/15/21 1411 03/16/21 0548 03/18/21 0325  NA 137 138 139 139  K 4.0 3.9 3.9 3.9  CL 107 108 108 104  CO2 21*  --  21* 25  GLUCOSE 97 92 105* 104*  BUN 13 14 11 13   CREATININE 1.04* 0.90 0.85 0.80  CALCIUM 9.2  --  8.8* 9.2  PROT 6.4*  --   --   --   ALBUMIN 3.7  --   --   --   AST 23  --   --   --   ALT 17  --   --   --   ALKPHOS 35*  --   --   --   BILITOT 0.6  --   --   --   GFRNONAA 59*  --  >60 >60  ANIONGAP 9  --  10 10     Hematology Recent Labs  Lab 03/15/21 1404 03/15/21 1411 03/16/21 0548 03/18/21 0325  WBC 6.6  --  6.4 7.3  RBC 5.00  --  4.86 5.16*  HGB 13.5 14.3 13.2 14.2  HCT 43.0 42.0 40.0 42.4  MCV 86.0  --  82.3 82.2  MCH 27.0  --  27.2 27.5  MCHC 31.4  --  33.0 33.5  RDW 14.6  --  14.6 14.1  PLT 203  --  191 195    Cardiac EnzymesNo results for input(s): TROPONINI in the last 168 hours. No results for input(s): TROPIPOC in the last 168 hours.   BNPNo results for input(s): BNP, PROBNP in the last 168 hours.   DDimer No results for input(s): DDIMER in the last 168 hours.   Radiology    MR BRAIN WO CONTRAST  Result Date: 03/16/2021 CLINICAL DATA:  Code stroke presentation yesterday. Right facial droop and aphasia. EXAM: MRI HEAD WITHOUT CONTRAST TECHNIQUE: Multiplanar, multiecho pulse sequences of the brain and surrounding structures were obtained without intravenous contrast. COMPARISON:  MRI study earlier same day.  CT studies done yesterday. FINDINGS: Brain: 3 punctate acute infarctions. One located at the left parietal vertex cortical brain. One located in the left corona radiata. One located along the cortical surface of the deep insula on the left. No large confluent infarction. There is an old small vessel infarction in the left para median pons. No focal cerebellar insult. Cerebral hemispheres show  minimal small vessel change of the white matter. No mass, hemorrhage, hydrocephalus or extra-axial collection. Vascular: Major vessels at the base of the brain show flow. Skull and upper cervical spine: Negative Sinuses/Orbits: Clear/normal Other: None IMPRESSION: Three punctate acute infarctions in the left MCA territory, 1 affecting the deep insular cortex on the left, 1 affecting the white matter of the corona radiography on the left and 1 affecting the left parietal vertex cortex. No large confluent infarction. No swelling or hemorrhage. Electronically Signed   By: Paulina Fusi M.D.   On: 03/16/2021 14:40   ECHOCARDIOGRAM COMPLETE  Result Date: 03/16/2021    ECHOCARDIOGRAM REPORT   Patient Name:   Leah Pierce Date of Exam: 03/16/2021 Medical Rec #:  [id number]            Height:       62.0 in Accession #:    [id number]           Weight:       129.6 lb Date of Birth:  21-Feb-1951            BSA:          1.590 m Patient Age:    69 years             BP:           134/97 mmHg Patient Gender: F                    HR:           90 bpm. Exam Location:  Inpatient Procedure: 2D Echo, Cardiac Doppler and Color Doppler Indications:    Stroke  History:        Patient has no prior history of Echocardiogram examinations.                 Risk Factors:Dyslipidemia.  Sonographer:    Ross Ludwig RDCS (AE) Referring Phys: [name] IMPRESSIONS  1. Left ventricular ejection fraction, by estimation, is 30 to 35%. The left ventricle has moderately decreased function. The left ventricle demonstrates global hypokinesis. There is moderate concentric left ventricular hypertrophy. Left ventricular diastolic parameters are consistent with Grade III diastolic dysfunction (restrictive). Elevated left atrial pressure.  2. Right ventricular  systolic function is normal. The right ventricular size is normal. There is normal pulmonary artery systolic pressure.  3. Left atrial size was severely dilated.  4. The mitral valve is  myxomatous. Moderate to severe mitral valve regurgitation. There is mild holosystolic prolapse of the middle segment of the anterior leaflet of the mitral valve.  5. The aortic valve is tricuspid. Aortic valve regurgitation is not visualized. No aortic stenosis is present. FINDINGS  Left Ventricle: Left ventricular ejection fraction, by estimation, is 30 to 35%. The left ventricle has moderately decreased function. The left ventricle demonstrates global hypokinesis. The left ventricular internal cavity size was normal in size. There is moderate concentric left ventricular hypertrophy. Left ventricular diastolic parameters are consistent with Grade III diastolic dysfunction (restrictive). Elevated left atrial pressure. Right Ventricle: The right ventricular size is normal. No increase in right ventricular wall thickness. Right ventricular systolic function is normal. There is normal pulmonary artery systolic pressure. The tricuspid regurgitant velocity is 2.46 m/s, and  with an assumed right atrial pressure of 3 mmHg, the estimated right ventricular systolic pressure is 27.2 mmHg. Left Atrium: Left atrial size was severely dilated. Right Atrium: Right atrial size was normal in size. Pericardium: There is no evidence of pericardial effusion. Mitral Valve: The mitral valve is myxomatous. There is mild holosystolic prolapse of the middle segment of the anterior leaflet of the mitral valve. There is moderate thickening of the mitral valve leaflet(s). Moderate to severe mitral valve regurgitation, with posteriorly-directed jet. MV peak gradient, 2.8 mmHg. The mean mitral valve gradient is 1.0 mmHg. Tricuspid Valve: The tricuspid valve is normal in structure. Tricuspid valve regurgitation is trivial. Aortic Valve: The aortic valve is tricuspid. Aortic valve regurgitation is not visualized. No aortic stenosis is present. Aortic valve peak gradient measures 4.4 mmHg. Aortic valve area, by VTI measures 1.56 cm. Pulmonic  Valve: The pulmonic valve was grossly normal. Pulmonic valve regurgitation is not visualized. Aorta: The aortic root and ascending aorta are structurally normal, with no evidence of dilitation. IAS/Shunts: There is right bowing of the interatrial septum, suggestive of elevated left atrial pressure. No atrial level shunt detected by color flow Doppler.  LEFT VENTRICLE PLAX 2D LVIDd:         4.30 cm     Diastology LVIDs:         3.20 cm     LV e' medial:    5.55 cm/s LV PW:         1.40 cm     LV E/e' medial:  18.4 LV IVS:        1.40 cm     LV e' lateral:   5.77 cm/s LVOT diam:     1.80 cm     LV E/e' lateral: 17.7 LV SV:         24 LV SV Index:   15 LVOT Area:     2.54 cm  LV Volumes (MOD) LV vol d, MOD A2C: 70.8 ml LV vol d, MOD A4C: 57.6 ml LV vol s, MOD A2C: 53.5 ml LV vol s, MOD A4C: 37.3 ml LV SV MOD A2C:     17.3 ml LV SV MOD A4C:     57.6 ml RIGHT VENTRICLE RV Basal diam:  3.30 cm RV S prime:     9.03 cm/s TAPSE (M-mode): 9.0 cm RVSP:           27.2 mmHg LEFT ATRIUM           Index  RIGHT ATRIUM LA diam:      3.90 cm 2.45 cm/m  RA Pressure: 3.00 mmHg LA Vol (A2C): 83.4 ml 52.45 ml/m LA Vol (A4C): 70.5 ml 44.34 ml/m  AORTIC VALVE AV Area (Vmax):    1.54 cm AV Area (Vmean):   1.44 cm AV Area (VTI):     1.56 cm AV Vmax:           105.00 cm/s AV Vmean:          76.700 cm/s AV VTI:            0.153 m AV Peak Grad:      4.4 mmHg LVOT Vmax:         63.70 cm/s LVOT Vmean:        43.500 cm/s LVOT VTI:          0.094 m LVOT/AV VTI ratio: 0.61  AORTA Ao Root diam: 2.50 cm MITRAL VALVE                 TRICUSPID VALVE MV Area (PHT): 5.02 cm      TR Peak grad:   24.2 mmHg MV Area VTI:   1.37 cm      TR Vmax:        246.00 cm/s MV Peak grad:  2.8 mmHg      Estimated RAP:  3.00 mmHg MV Mean grad:  1.0 mmHg      RVSP:           27.2 mmHg MV Vmax:       0.84 m/s MV Vmean:      4770.0 cm/s   SHUNTS MV VTI:        0.17 m        Systemic VTI:  0.09 m MV PHT:        72.00 msec    Systemic Diam: 1.80 cm MV Decel Time:  151 msec MR Peak grad:    1.0 mmHg MR Vmax:         50.20 cm/s MR Vmean:        6400.0 cm/s MR PISA Nyquist: 0.4 m/s MR PISA:         1.57 cm MR PISA Radius:  0.50 cm MV E velocity: 102.00 cm/s Rachelle Hora Croitoru MD Electronically signed by Thurmon Fair MD Signature Date/Time: 03/16/2021/6:22:19 PM    Final    VAS [place] LOWER EXTREMITY VENOUS (DVT)  Result Date: 03/16/2021  Lower Venous DVT Study Patient Name:  GARRETT MONETTE  Date of Exam:   03/16/2021 Medical Rec #: [id number]             Accession #:    [id number] Date of Birth: 1951-07-03             Patient Gender: F Patient Age:   69Y Exam Location:  [facility] Procedure:      VAS [place] LOWER EXTREMITY VENOUS (DVT) Referring Phys: [id number] Marvel Plan --------------------------------------------------------------------------------  Indications: Stroke.  Comparison Study: No prior study on file Performing Technologist: Sherren Kerns RVS  Examination Guidelines: A complete evaluation includes B-mode imaging, spectral Doppler, color Doppler, and power Doppler as needed of all accessible portions of each vessel. Bilateral testing is considered an integral part of a complete examination. Limited examinations for reoccurring indications may be performed as noted. The reflux portion of the exam is performed with the patient in reverse Trendelenburg.  +---------+---------------+---------+-----------+----------+--------------+ RIGHT    CompressibilityPhasicitySpontaneityPropertiesThrombus Aging +---------+---------------+---------+-----------+----------+--------------+ CFV      Full  Yes      Yes                                 +---------+---------------+---------+-----------+----------+--------------+ SFJ      Full                                                        +---------+---------------+---------+-----------+----------+--------------+ FV Prox  Full                                                         +---------+---------------+---------+-----------+----------+--------------+ FV Mid   Full                                                        +---------+---------------+---------+-----------+----------+--------------+ FV DistalFull                                                        +---------+---------------+---------+-----------+----------+--------------+ PFV      Full                                                        +---------+---------------+---------+-----------+----------+--------------+ POP      Full           Yes      Yes                                 +---------+---------------+---------+-----------+----------+--------------+ PTV      Full                                                        +---------+---------------+---------+-----------+----------+--------------+ PERO     Full                                                        +---------+---------------+---------+-----------+----------+--------------+   +---------+---------------+---------+-----------+----------+--------------+ LEFT     CompressibilityPhasicitySpontaneityPropertiesThrombus Aging +---------+---------------+---------+-----------+----------+--------------+ CFV      Full           Yes      Yes                                 +---------+---------------+---------+-----------+----------+--------------+ SFJ  Full                                                        +---------+---------------+---------+-----------+----------+--------------+ FV Prox  Full                                                        +---------+---------------+---------+-----------+----------+--------------+ FV Mid   Full                                                        +---------+---------------+---------+-----------+----------+--------------+ FV DistalFull                                                         +---------+---------------+---------+-----------+----------+--------------+ PFV      Full                                                        +---------+---------------+---------+-----------+----------+--------------+ POP      Full           Yes      Yes                                 +---------+---------------+---------+-----------+----------+--------------+ PTV      Full                                                        +---------+---------------+---------+-----------+----------+--------------+ PERO     Full                                                        +---------+---------------+---------+-----------+----------+--------------+     Summary: BILATERAL: - No evidence of deep vein thrombosis seen in the lower extremities, bilaterally. -   *See table(s) above for measurements and observations. Electronically signed by Coral ElseVance Brabham MD on 03/16/2021 at 4:17:58 PM.    Final     Cardiac Studies   Echo shows myxomatous mitral valve with reduced EF and mod-severe MR  Patient Profile     70 y.o. female PMH of HLD, pre-DM type 2, and asthma who is being seen today for the evaluation of CHF    Assessment & Plan   Active Problems:   Stroke determined by clinical assessment (HCC)   1. Acute cardiomyopathy: patient presented with an  acute stroke and was incidentally found to have a cardiomyopathy on echo this admission. EF 30-35%, G3DD, and moderate-severe MR with evidence of MVP. She reports family history of CHF in siblings and MV disease in son. She denies any complaints of chest pain, SOB, DOE, palpitations, LE edema, orthopnea, or PND. She does have risk factors for CAD including HLD and pre-DM. Etiology of her cardiomyopathy remains unclear. Per neurology, goal SBP in the 130s-150s at this time given bilateral cavernous ICA stenosis.   - Hopeful to add GDMT including metoprolol succinate and entresto as she recovers from her CVA - Anticipate need for an  ischemic evaluation pending further work-up for her mitral valve disease - consider LHC vs coronary CTA. She has no definite proximal coronary artery calcifications on limited view from CTA neck performed in setting of stroke.  - CTA shows severely dilated LA and no definite thrombus in LAA.  - Will need repeat echo 3 months after initiation of GDMT to re-evaluate LV function and determine need for ICD for primary prevention - lasix given yesterday, UOP 700 ml yesterday. Suspect JVP may reflect large V wave from MR.   2. Mitral valve regurgitation: echo this admission with myxomatous valve with moderate-severe MR and mild holosystolic prolapse of the middle segment of the anterior leaflet. She denies recent SOB, DOE, LE edema, orthopnea, or PND.  - Will plan for TEE 03/18/21 to better assess mitral valve.  Consent documented yesterday.    3. CVA: patient presented as a Code Stroke with aphasia, right sided weakness, and right mouth droop. CT confirmed left ICA terminus occlusion; MRI brain with 3 punctate acute infarctions in the left MCA territory. She received tPA with resolution of symptoms. Work up also revealed bilateral cavernous ICA stenosis. She was started on aspirin 325mg  daily and plavix 75 mg daily with plans for a 3 month course, followed by aspirin monotherapy.  - Will check TEE as above to evaluate for thombus, none seen on CTA neck with limited view of LA/LAA. - Anticipate 30-day monitor at discharge to further evaluate for atrial fibrillation/flutter - she does have evidence of severe LAE on echo this admission though no complaints of palpitations - Continue aspirin and plavix per neurology - Continue statin - Allowing permissive HTN for now   4. HLD: LDL 118 this admission. Home atorvastatin increased from 20mg  daily to 40mg  daily - Continue atorvastatin - Will need repeat FLP/LFTs in 6-8 weeks   5. Pre-DM type 2: A1C 5.9 this admission.  - Continue dietary/lifestyle  modifications to prevent progression        For questions or updates, please contact [facility] Please consult [email or web address] for contact info under        Signed, Parke Poisson, MD  03/18/2021, 11:54 AM

## 2021-03-18 NOTE — H&P (View-Only) (Signed)
 Progress Note  Patient Name: Leah Pierce Date of Encounter: 03/18/2021  Primary Cardiologist: No primary care provider on file.   Subjective   Patient seen in endoscopy prior to TEE. No CP or SOB, we discussed why we are performing TEE.  Inpatient Medications    Scheduled Meds: . [MAR Hold] vitamin C  500 mg Oral Daily  . [MAR Hold] aspirin EC  325 mg Oral Daily  . [MAR Hold] atorvastatin  40 mg Oral Daily  . [MAR Hold] budesonide (PULMICORT) nebulizer solution  0.25 mg Nebulization BID  . [MAR Hold] Chlorhexidine Gluconate Cloth  6 each Topical Daily  . [MAR Hold] cholecalciferol  1,000 Units Oral Daily  . [MAR Hold] clopidogrel  75 mg Oral Daily  . [MAR Hold] insulin aspart  0-9 Units Subcutaneous TID WC  . [MAR Hold] sodium chloride flush  3 mL Intravenous Once   Continuous Infusions: . [MAR Hold] sodium chloride    . sodium chloride 125 mL/hr at 03/18/21 1130   PRN Meds: [MAR Hold] acetaminophen **OR** [MAR Hold] acetaminophen (TYLENOL) oral liquid 160 mg/5 mL **OR** [MAR Hold] acetaminophen, [MAR Hold] albuterol, [MAR Hold] labetalol, [MAR Hold] melatonin, [MAR Hold] senna-docusate   Vital Signs    Vitals:   03/18/21 0542 03/18/21 0747 03/18/21 0755 03/18/21 1127  BP:   (!) 126/95 (!) 140/93  Pulse:   87 91  Resp:   20 20  Temp:   97.8 F (36.6 C) (!) 97.4 F (36.3 C)  TempSrc:   Oral Oral  SpO2:  96% 99% 100%  Weight: 62.1 kg     Height:        Intake/Output Summary (Last 24 hours) at 03/18/2021 1154 Last data filed at 03/18/2021 0100 Gross per 24 hour  Intake --  Output 450 ml  Net -450 ml   Filed Weights   03/15/21 1400 03/15/21 1450 03/18/21 0542  Weight: 58.8 kg 58.8 kg 62.1 kg    Telemetry    SR - Personally Reviewed  ECG    No new - Personally Reviewed  Physical Exam   GEN: No acute distress.   Neck: No JVD Cardiac: regular rhythm, normal rate, 2/6 HSM Respiratory: Clear to auscultation bilaterally. GI: Soft, nontender,  non-distended  MS: No edema; No deformity. Neuro:  a + o x 3 Psych: Normal affect   Labs    Chemistry Recent Labs  Lab 03/15/21 1404 03/15/21 1411 03/16/21 0548 03/18/21 0325  NA 137 138 139 139  K 4.0 3.9 3.9 3.9  CL 107 108 108 104  CO2 21*  --  21* 25  GLUCOSE 97 92 105* 104*  BUN 13 14 11 13  CREATININE 1.04* 0.90 0.85 0.80  CALCIUM 9.2  --  8.8* 9.2  PROT 6.4*  --   --   --   ALBUMIN 3.7  --   --   --   AST 23  --   --   --   ALT 17  --   --   --   ALKPHOS 35*  --   --   --   BILITOT 0.6  --   --   --   GFRNONAA 59*  --  >60 >60  ANIONGAP 9  --  10 10     Hematology Recent Labs  Lab 03/15/21 1404 03/15/21 1411 03/16/21 0548 03/18/21 0325  WBC 6.6  --  6.4 7.3  RBC 5.00  --  4.86 5.16*  HGB 13.5 14.3 13.2 14.2    HCT 43.0 42.0 40.0 42.4  MCV 86.0  --  82.3 82.2  MCH 27.0  --  27.2 27.5  MCHC 31.4  --  33.0 33.5  RDW 14.6  --  14.6 14.1  PLT 203  --  191 195    Cardiac EnzymesNo results for input(s): TROPONINI in the last 168 hours. No results for input(s): TROPIPOC in the last 168 hours.   BNPNo results for input(s): BNP, PROBNP in the last 168 hours.   DDimer No results for input(s): DDIMER in the last 168 hours.   Radiology    MR BRAIN WO CONTRAST  Result Date: 03/16/2021 CLINICAL DATA:  Code stroke presentation yesterday. Right facial droop and aphasia. EXAM: MRI HEAD WITHOUT CONTRAST TECHNIQUE: Multiplanar, multiecho pulse sequences of the brain and surrounding structures were obtained without intravenous contrast. COMPARISON:  MRI study earlier same day.  CT studies done yesterday. FINDINGS: Brain: 3 punctate acute infarctions. One located at the left parietal vertex cortical brain. One located in the left corona radiata. One located along the cortical surface of the deep insula on the left. No large confluent infarction. There is an old small vessel infarction in the left para median pons. No focal cerebellar insult. Cerebral hemispheres show  minimal small vessel change of the white matter. No mass, hemorrhage, hydrocephalus or extra-axial collection. Vascular: Major vessels at the base of the brain show flow. Skull and upper cervical spine: Negative Sinuses/Orbits: Clear/normal Other: None IMPRESSION: Three punctate acute infarctions in the left MCA territory, 1 affecting the deep insular cortex on the left, 1 affecting the white matter of the corona radiography on the left and 1 affecting the left parietal vertex cortex. No large confluent infarction. No swelling or hemorrhage. Electronically Signed   By: Mark  Shogry M.D.   On: 03/16/2021 14:40   ECHOCARDIOGRAM COMPLETE  Result Date: 03/16/2021    ECHOCARDIOGRAM REPORT   Patient Name:   Leah Pierce Nohr Date of Exam: 03/16/2021 Medical Rec #:  [id number]            Height:       62.0 in Accession #:    [id number]           Weight:       129.6 lb Date of Birth:  07/14/1951            BSA:          1.590 m Patient Age:    69 years             BP:           134/97 mmHg Patient Gender: F                    HR:           90 bpm. Exam Location:  Inpatient Procedure: 2D Echo, Cardiac Doppler and Color Doppler Indications:    Stroke  History:        Patient has no prior history of Echocardiogram examinations.                 Risk Factors:Dyslipidemia.  Sonographer:    Arthur Guy RDCS (AE) Referring Phys: Toberman Stevi IMPRESSIONS  1. Left ventricular ejection fraction, by estimation, is 30 to 35%. The left ventricle has moderately decreased function. The left ventricle demonstrates global hypokinesis. There is moderate concentric left ventricular hypertrophy. Left ventricular diastolic parameters are consistent with Grade III diastolic dysfunction (restrictive). Elevated left atrial pressure.  2. Right ventricular   systolic function is normal. The right ventricular size is normal. There is normal pulmonary artery systolic pressure.  3. Left atrial size was severely dilated.  4. The mitral valve is  myxomatous. Moderate to severe mitral valve regurgitation. There is mild holosystolic prolapse of the middle segment of the anterior leaflet of the mitral valve.  5. The aortic valve is tricuspid. Aortic valve regurgitation is not visualized. No aortic stenosis is present. FINDINGS  Left Ventricle: Left ventricular ejection fraction, by estimation, is 30 to 35%. The left ventricle has moderately decreased function. The left ventricle demonstrates global hypokinesis. The left ventricular internal cavity size was normal in size. There is moderate concentric left ventricular hypertrophy. Left ventricular diastolic parameters are consistent with Grade III diastolic dysfunction (restrictive). Elevated left atrial pressure. Right Ventricle: The right ventricular size is normal. No increase in right ventricular wall thickness. Right ventricular systolic function is normal. There is normal pulmonary artery systolic pressure. The tricuspid regurgitant velocity is 2.46 m/s, and  with an assumed right atrial pressure of 3 mmHg, the estimated right ventricular systolic pressure is 27.2 mmHg. Left Atrium: Left atrial size was severely dilated. Right Atrium: Right atrial size was normal in size. Pericardium: There is no evidence of pericardial effusion. Mitral Valve: The mitral valve is myxomatous. There is mild holosystolic prolapse of the middle segment of the anterior leaflet of the mitral valve. There is moderate thickening of the mitral valve leaflet(s). Moderate to severe mitral valve regurgitation, with posteriorly-directed jet. MV peak gradient, 2.8 mmHg. The mean mitral valve gradient is 1.0 mmHg. Tricuspid Valve: The tricuspid valve is normal in structure. Tricuspid valve regurgitation is trivial. Aortic Valve: The aortic valve is tricuspid. Aortic valve regurgitation is not visualized. No aortic stenosis is present. Aortic valve peak gradient measures 4.4 mmHg. Aortic valve area, by VTI measures 1.56 cm. Pulmonic  Valve: The pulmonic valve was grossly normal. Pulmonic valve regurgitation is not visualized. Aorta: The aortic root and ascending aorta are structurally normal, with no evidence of dilitation. IAS/Shunts: There is right bowing of the interatrial septum, suggestive of elevated left atrial pressure. No atrial level shunt detected by color flow Doppler.  LEFT VENTRICLE PLAX 2D LVIDd:         4.30 cm     Diastology LVIDs:         3.20 cm     LV e' medial:    5.55 cm/s LV PW:         1.40 cm     LV E/e' medial:  18.4 LV IVS:        1.40 cm     LV e' lateral:   5.77 cm/s LVOT diam:     1.80 cm     LV E/e' lateral: 17.7 LV SV:         24 LV SV Index:   15 LVOT Area:     2.54 cm  LV Volumes (MOD) LV vol d, MOD A2C: 70.8 ml LV vol d, MOD A4C: 57.6 ml LV vol s, MOD A2C: 53.5 ml LV vol s, MOD A4C: 37.3 ml LV SV MOD A2C:     17.3 ml LV SV MOD A4C:     57.6 ml RIGHT VENTRICLE RV Basal diam:  3.30 cm RV S prime:     9.03 cm/s TAPSE (M-mode): 9.0 cm RVSP:           27.2 mmHg LEFT ATRIUM           Index         RIGHT ATRIUM LA diam:      3.90 cm 2.45 cm/m  RA Pressure: 3.00 mmHg LA Vol (A2C): 83.4 ml 52.45 ml/m LA Vol (A4C): 70.5 ml 44.34 ml/m  AORTIC VALVE AV Area (Vmax):    1.54 cm AV Area (Vmean):   1.44 cm AV Area (VTI):     1.56 cm AV Vmax:           105.00 cm/s AV Vmean:          76.700 cm/s AV VTI:            0.153 m AV Peak Grad:      4.4 mmHg LVOT Vmax:         63.70 cm/s LVOT Vmean:        43.500 cm/s LVOT VTI:          0.094 m LVOT/AV VTI ratio: 0.61  AORTA Ao Root diam: 2.50 cm MITRAL VALVE                 TRICUSPID VALVE MV Area (PHT): 5.02 cm      TR Peak grad:   24.2 mmHg MV Area VTI:   1.37 cm      TR Vmax:        246.00 cm/s MV Peak grad:  2.8 mmHg      Estimated RAP:  3.00 mmHg MV Mean grad:  1.0 mmHg      RVSP:           27.2 mmHg MV Vmax:       0.84 m/s MV Vmean:      4770.0 cm/s   SHUNTS MV VTI:        0.17 m        Systemic VTI:  0.09 m MV PHT:        72.00 msec    Systemic Diam: 1.80 cm MV Decel Time:  151 msec MR Peak grad:    1.0 mmHg MR Vmax:         50.20 cm/s MR Vmean:        6400.0 cm/s MR PISA Nyquist: 0.4 m/s MR PISA:         1.57 cm MR PISA Radius:  0.50 cm MV E velocity: 102.00 cm/s Mihai Croitoru MD Electronically signed by Mihai Croitoru MD Signature Date/Time: 03/16/2021/6:22:19 PM    Final    VAS US LOWER EXTREMITY VENOUS (DVT)  Result Date: 03/16/2021  Lower Venous DVT Study Patient Name:  Leah Pierce  Date of Exam:   03/16/2021 Medical Rec #: [id number]             Accession #:    [id number] Date of Birth: 12/15/1950             Patient Gender: F Patient Age:   69Y Exam Location:  [facility] Procedure:      VAS US LOWER EXTREMITY VENOUS (DVT) Referring Phys: [id number] JINDONG XU --------------------------------------------------------------------------------  Indications: Stroke.  Comparison Study: No prior study on file Performing Technologist: Candace Kanady RVS  Examination Guidelines: A complete evaluation includes B-mode imaging, spectral Doppler, color Doppler, and power Doppler as needed of all accessible portions of each vessel. Bilateral testing is considered an integral part of a complete examination. Limited examinations for reoccurring indications may be performed as noted. The reflux portion of the exam is performed with the patient in reverse Trendelenburg.  +---------+---------------+---------+-----------+----------+--------------+ RIGHT    CompressibilityPhasicitySpontaneityPropertiesThrombus Aging +---------+---------------+---------+-----------+----------+--------------+ CFV      Full             Yes      Yes                                 +---------+---------------+---------+-----------+----------+--------------+ SFJ      Full                                                        +---------+---------------+---------+-----------+----------+--------------+ FV Prox  Full                                                         +---------+---------------+---------+-----------+----------+--------------+ FV Mid   Full                                                        +---------+---------------+---------+-----------+----------+--------------+ FV DistalFull                                                        +---------+---------------+---------+-----------+----------+--------------+ PFV      Full                                                        +---------+---------------+---------+-----------+----------+--------------+ POP      Full           Yes      Yes                                 +---------+---------------+---------+-----------+----------+--------------+ PTV      Full                                                        +---------+---------------+---------+-----------+----------+--------------+ PERO     Full                                                        +---------+---------------+---------+-----------+----------+--------------+   +---------+---------------+---------+-----------+----------+--------------+ LEFT     CompressibilityPhasicitySpontaneityPropertiesThrombus Aging +---------+---------------+---------+-----------+----------+--------------+ CFV      Full           Yes      Yes                                 +---------+---------------+---------+-----------+----------+--------------+ SFJ        Full                                                        +---------+---------------+---------+-----------+----------+--------------+ FV Prox  Full                                                        +---------+---------------+---------+-----------+----------+--------------+ FV Mid   Full                                                        +---------+---------------+---------+-----------+----------+--------------+ FV DistalFull                                                         +---------+---------------+---------+-----------+----------+--------------+ PFV      Full                                                        +---------+---------------+---------+-----------+----------+--------------+ POP      Full           Yes      Yes                                 +---------+---------------+---------+-----------+----------+--------------+ PTV      Full                                                        +---------+---------------+---------+-----------+----------+--------------+ PERO     Full                                                        +---------+---------------+---------+-----------+----------+--------------+     Summary: BILATERAL: - No evidence of deep vein thrombosis seen in the lower extremities, bilaterally. -   *See table(s) above for measurements and observations. Electronically signed by Vance Brabham MD on 03/16/2021 at 4:17:58 PM.    Final     Cardiac Studies   Echo shows myxomatous mitral valve with reduced EF and mod-severe MR  Patient Profile     69 y.o. female PMH of HLD, pre-DM type 2, and asthma who is being seen today for the evaluation of CHF    Assessment & Plan   Active Problems:   Stroke determined by clinical assessment (HCC)   1. Acute cardiomyopathy: patient presented with an   acute stroke and was incidentally found to have a cardiomyopathy on echo this admission. EF 30-35%, G3DD, and moderate-severe MR with evidence of MVP. She reports family history of CHF in siblings and MV disease in son. She denies any complaints of chest pain, SOB, DOE, palpitations, LE edema, orthopnea, or PND. She does have risk factors for CAD including HLD and pre-DM. Etiology of her cardiomyopathy remains unclear. Per neurology, goal SBP in the 130s-150s at this time given bilateral cavernous ICA stenosis.   - Hopeful to add GDMT including metoprolol succinate and entresto as she recovers from her CVA - Anticipate need for an  ischemic evaluation pending further work-up for her mitral valve disease - consider LHC vs coronary CTA. She has no definite proximal coronary artery calcifications on limited view from CTA neck performed in setting of stroke.  - CTA shows severely dilated LA and no definite thrombus in LAA.  - Will need repeat echo 3 months after initiation of GDMT to re-evaluate LV function and determine need for ICD for primary prevention - lasix given yesterday, UOP 700 ml yesterday. Suspect JVP may reflect large V wave from MR.   2. Mitral valve regurgitation: echo this admission with myxomatous valve with moderate-severe MR and mild holosystolic prolapse of the middle segment of the anterior leaflet. She denies recent SOB, DOE, LE edema, orthopnea, or PND.  - Will plan for TEE 03/18/21 to better assess mitral valve.  Consent documented yesterday.    3. CVA: patient presented as a Code Stroke with aphasia, right sided weakness, and right mouth droop. CT confirmed left ICA terminus occlusion; MRI brain with 3 punctate acute infarctions in the left MCA territory. She received tPA with resolution of symptoms. Work up also revealed bilateral cavernous ICA stenosis. She was started on aspirin 325mg daily and plavix 75 mg daily with plans for a 3 month course, followed by aspirin monotherapy.  - Will check TEE as above to evaluate for thombus, none seen on CTA neck with limited view of LA/LAA. - Anticipate 30-day monitor at discharge to further evaluate for atrial fibrillation/flutter - she does have evidence of severe LAE on echo this admission though no complaints of palpitations - Continue aspirin and plavix per neurology - Continue statin - Allowing permissive HTN for now   4. HLD: LDL 118 this admission. Home atorvastatin increased from 20mg daily to 40mg daily - Continue atorvastatin - Will need repeat FLP/LFTs in 6-8 weeks   5. Pre-DM type 2: A1C 5.9 this admission.  - Continue dietary/lifestyle  modifications to prevent progression        For questions or updates, please contact [facility] Please consult [email or web address] for contact info under        Signed, Sherrick Araki A Alexys Lobello, MD  03/18/2021, 11:54 AM    

## 2021-03-19 ENCOUNTER — Other Ambulatory Visit: Payer: Self-pay | Admitting: Physician Assistant

## 2021-03-19 ENCOUNTER — Encounter: Payer: Self-pay | Admitting: *Deleted

## 2021-03-19 DIAGNOSIS — I639 Cerebral infarction, unspecified: Secondary | ICD-10-CM

## 2021-03-19 DIAGNOSIS — I255 Ischemic cardiomyopathy: Secondary | ICD-10-CM | POA: Diagnosis not present

## 2021-03-19 LAB — BASIC METABOLIC PANEL
Anion gap: 10 (ref 5–15)
BUN: 12 mg/dL (ref 8–23)
CO2: 23 mmol/L (ref 22–32)
Calcium: 9.2 mg/dL (ref 8.9–10.3)
Chloride: 104 mmol/L (ref 98–111)
Creatinine, Ser: 0.78 mg/dL (ref 0.44–1.00)
GFR, Estimated: >60 mL/min (ref >60)
Glucose, Bld: 101 mg/dL — ABNORMAL HIGH (ref 70–99)
Potassium: 4.2 mmol/L (ref 3.5–5.1)
Sodium: 137 mmol/L (ref 135–145)

## 2021-03-19 LAB — CBC
HCT: 43.9 % (ref 36.0–46.0)
Hemoglobin: 14.3 g/dL (ref 12.0–15.0)
MCH: 27.3 pg (ref 26.0–34.0)
MCHC: 32.6 g/dL (ref 30.0–36.0)
MCV: 83.9 fL (ref 80.0–100.0)
Platelets: 207 10*3/uL (ref 150–400)
RBC: 5.23 MIL/uL — ABNORMAL HIGH (ref 3.87–5.11)
RDW: 14.3 % (ref 11.5–15.5)
WBC: 5.6 10*3/uL (ref 4.0–10.5)
nRBC: 0 % (ref 0.0–0.2)

## 2021-03-19 LAB — GLUCOSE, CAPILLARY: Glucose-Capillary: 115 mg/dL — ABNORMAL HIGH (ref 70–99)

## 2021-03-19 LAB — TSH: TSH: 1.93 u[IU]/mL (ref 0.350–4.500)

## 2021-03-19 MED ORDER — ELIQUIS 5 MG PO TABS: 5.0000 mg | ORAL_TABLET | Freq: Two times a day (BID) | ORAL | 2 refills | Status: DC

## 2021-03-19 MED ORDER — LIVING BETTER WITH HEART FAILURE BOOK: Freq: Once | Status: DC

## 2021-03-19 NOTE — Discharge Summary (Addendum)
Stroke Discharge Summary  Patient ID: Leah Pierce   MRN: [id number]      DOB: August 30, 1951  Date of Admission: 03/15/2021 Date of Discharge: 03/19/2021  Attending Physician:  Stroke, Md, MD, Stroke MD Consultant(s):    Cardiology, Dr. Parke Poisson, MD  Patient's PCP:  [place], Betty G, MD  DISCHARGE DIAGNOSIS: 1. Left MCA punctate infarct due to left terminal ICA occlusion status post tPA, etiology likely due to large vessel disease vs. cardiomyopathy with low EF 2. Newly recognized cardiomyopathy/acute combined CHF 3. Mitral regurgitation 4. Hyperlipidemia goal LDL <70 5. Carotid Stenosis: hemodynamically significant stenoses of BOTH ICA siphons at the distal cavernous/anterior genu level 6. Intrapulmonary shunt  Allergies as of 03/19/2021      Reactions   Tetracyclines & Related    Rash on hands      Medication List    TAKE these medications   AeroChamber Plus inhaler Use as instructed to use with inahaler.   albuterol 108 (90 Base) MCG/ACT inhaler Commonly known as: VENTOLIN HFA Inhale 2 puffs into the lungs every 6 (six) hours as needed for wheezing or shortness of breath.   alendronate 70 MG tablet Commonly known as: FOSAMAX Take 1 tablet (70 mg total) by mouth every 7 (seven) days. Take with a full glass of water on an empty stomach. What changed: additional instructions   atorvastatin 20 MG tablet Commonly known as: LIPITOR Take 1 tablet (20 mg total) by mouth daily.   cholecalciferol 25 MCG (1000 UNIT) tablet Commonly known as: VITAMIN D3 Take 1,000 Units by mouth daily.   Eliquis 5 MG Tabs tablet Generic drug: apixaban Take 1 tablet (5 mg total) by mouth 2 (two) times daily.   Flovent HFA 110 MCG/ACT inhaler Generic drug: fluticasone Inhale 1 puff into the lungs in the morning and at bedtime.   Osteo Bi-Flex Adv Joint Shield Tabs Take 1 tablet by mouth daily.   OVER THE COUNTER MEDICATION Take 1 tablet by mouth daily. NEURIVA   vitamin  C 500 MG tablet Commonly known as: ASCORBIC ACID Take 500 mg by mouth daily.   vitamin E 200 UNIT capsule Take 200 Units by mouth daily.      LABORATORY STUDIES CBC    Component Value Date/Time   WBC 5.6 03/19/2021 0453   RBC 5.23 (H) 03/19/2021 0453   HGB 14.3 03/19/2021 0453   HCT 43.9 03/19/2021 0453   PLT 207 03/19/2021 0453   MCV 83.9 03/19/2021 0453   MCH 27.3 03/19/2021 0453   MCHC 32.6 03/19/2021 0453   RDW 14.3 03/19/2021 0453   LYMPHSABS 2.2 03/16/2021 0548   MONOABS 0.5 03/16/2021 0548   EOSABS 0.0 03/16/2021 0548   BASOSABS 0.0 03/16/2021 0548   CMP    Component Value Date/Time   NA 137 03/19/2021 0453   K 4.2 03/19/2021 0453   CL 104 03/19/2021 0453   CO2 23 03/19/2021 0453   GLUCOSE 101 (H) 03/19/2021 0453   BUN 12 03/19/2021 0453   CREATININE 0.78 03/19/2021 0453   CALCIUM 9.2 03/19/2021 0453   PROT 6.4 (L) 03/15/2021 1404   ALBUMIN 3.7 03/15/2021 1404   AST 23 03/15/2021 1404   ALT 17 03/15/2021 1404   ALKPHOS 35 (L) 03/15/2021 1404   BILITOT 0.6 03/15/2021 1404   GFRNONAA >60 03/19/2021 0453   COAGS Lab Results  Component Value Date   INR 0.9 03/15/2021   Lipid Panel    Component Value Date/Time   CHOL  211 (H) 03/16/2021 0548   TRIG 69 03/16/2021 0548   HDL 79 03/16/2021 0548   CHOLHDL 2.7 03/16/2021 0548   VLDL 14 03/16/2021 0548   LDLCALC 118 (H) 03/16/2021 0548   HgbA1C  Lab Results  Component Value Date   HGBA1C 5.9 (H) 03/16/2021   Urinalysis    Component Value Date/Time   COLORURINE YELLOW 03/15/2021 1830   APPEARANCEUR CLEAR 03/15/2021 1830   LABSPEC 1.021 03/15/2021 1830   PHURINE 6.0 03/15/2021 1830   GLUCOSEU NEGATIVE 03/15/2021 1830   HGBUR NEGATIVE 03/15/2021 1830   BILIRUBINUR NEGATIVE 03/15/2021 1830   KETONESUR NEGATIVE 03/15/2021 1830   PROTEINUR NEGATIVE 03/15/2021 1830   NITRITE NEGATIVE 03/15/2021 1830   LEUKOCYTESUR NEGATIVE 03/15/2021 1830   Urine Drug Screen     Component Value Date/Time    LABOPIA NONE DETECTED 03/15/2021 1830   COCAINSCRNUR NONE DETECTED 03/15/2021 1830   LABBENZ NONE DETECTED 03/15/2021 1830   AMPHETMU NONE DETECTED 03/15/2021 1830   THCU POSITIVE (A) 03/15/2021 1830   LABBARB NONE DETECTED 03/15/2021 1830    Alcohol Level No results found for: ETH   SIGNIFICANT DIAGNOSTIC STUDIES  MR ANGIO HEAD WO CONTRAST MR ANGIO NECK W WO CONTRAST 03/16/2021 IMPRESSION:  1. Patent Left ICA terminus but hemodynamically significant stenoses of BOTH ICA siphons at the distal cavernous/anterior genu level.  2. No large vessel occlusion identified. Left ACA remains patent with moderate origin stenosis. Left MCA is patent with mild origin stenosis.  3. Right side Persistent Hypoglossal Artery, rare anatomic variant, supplies the Basilar. Both vertebral arteries are diminutive, and the left terminates in PICA.  4. Mild left and mild to moderate stenosis bilateral PCA P1/P2.   CT HEAD CODE STROKE WO CONTRAST 03/15/2021 IMPRESSION:  No evidence of acute intracranial abnormality.  ASPECTS is 10. Small region of cortical calcification within the right occipital lobe (PCA vascular territory). This is nonspecific, but may reflect dystrophic calcification at site of a chronic infarct. Chronic lacunar infarct within the right thalamus. Mild generalized parenchymal atrophy.   CT ANGIO HEAD AND NECK CODE STROKE 03/15/2021 IMPRESSION:  1. Left ICA terminus occlusion extending to the proximal left A1/ACA and M1/MCA ( T occlusion). There is good leptomeningeal collateral flow to the left MCA vascular tree and filling of the left ACA via anterior communicating artery.  2. High cervical right internal carotid artery bifurcation corresponding to origin of a type 1 proatlantal artery.  3. Diminutive intracranial vertebral arteries mostly supplying the bilateral PICA territories.  4. Prominent left palatine tonsil. Correlation with direct exam suggested.  MRI Brain WO Contrast Three  punctate acute infarctions in the left MCA territory, 1 affecting the deep insular cortex on the left, 1 affecting the white matter of the corona radiography on the left and 1 affecting the left parietal vertex cortex. No large confluent infarction. No swelling or hemorrhage.  Transthoracic Echocardiogram  1. Left ventricular ejection fraction, by estimation, is 30 to 35%. The  left ventricle has moderately decreased function. The left ventricle  demonstrates global hypokinesis. There is moderate concentric left  ventricular hypertrophy. Left ventricular  diastolic parameters are consistent with Grade III diastolic dysfunction  (restrictive). Elevated left atrial pressure.  2. Right ventricular systolic function is normal. The right ventricular  size is normal. There is normal pulmonary artery systolic pressure.  3. Left atrial size was severely dilated.  4. The mitral valve is myxomatous. Moderate to severe mitral valve  regurgitation. There is mild holosystolic prolapse of the middle segment  of the anterior leaflet of the mitral valve.  5. The aortic valve is tricuspid. Aortic valve regurgitation is not  visualized. No aortic stenosis is present.   ECG - SR rate 93 BPM. (See cardiology reading for complete details)  TEE Moderate to severe global reduction in LV systolic function; severe LAE; no LAA thrombus; thickened MV with mild prolapse of anterior MV leaftle; moderate to severe MR; late positive saline microcavitation study suggestive of intrapulmonary shunt.    HISTORY OF PRESENT ILLNESS Ms. Reagann Dolce is a 70 y.o. female with history of hyperlipidemia, migraines, prediabetes, and asthma who presented to the ED today for evaluation of acute onset of aphasia, mild right facial droop, RLE drift and possible confusion. CTA revealed left ICA terminus occlusion extending to the proximal left A1/ACA and M1/MCA. The patient received IV t-PA Friday 03/15/21.   HOSPITAL  COURSE Maddix Kliewer was admitted to the neurologic ICU for post thrombolytic monitoring and care as well as ongoing stroke work up. She remained stable from a both neurologic and hemodynamic standpoint. She was transferred to the floor where she progressed well. Therapy teams provided evaluation, treatment and discharge recommendations. She was recommended to be discharged home with outpatient OT. Her hospital problem list is below:  Stroke: Left MCA punctate infarct due to left terminal ICA occlusion status post tPA, etiology likely due to large vessel disease vs. cardiomyopathy with low EF  CT Head -  No evidence of acute intracranial abnormality.  ASPECTS is 10. Chronic lacunar infarct within the right thalamus.    CTA H&N - Left ICA terminus occlusion extending to the proximal left A1/ACA and M1/MCA ( T occlusion). There is good leptomeningeal collateral flow to the left MCA vascular tree and filling of the left ACA via anterior communicating artery. High cervical right internal carotid artery bifurcation corresponding to origin of a type 1 proatlantal artery. Diminutive intracranial vertebral arteries mostly supplying the bilateral PICA territories.   MRA H&N - Patent Left ICA terminus but hemodynamically significant stenoses of BOTH ICA siphons at the distal cavernous/anterior genu level. No large vessel occlusion identified. Left ACA remains patent with moderate origin stenosis. Left MCA is patent with mild origin stenosis. Right side Persistent Hypoglossal Artery, rare anatomic variant, supplies the Basilar. Both vertebral arteries are diminutive, and the left terminates in PICA. Mild left and mild to moderate stenosis bilateral PCA P1/P2.   MRI head - 3 punctate left MCA infarcts  2D Echo - EF 30 to 35%  TEE- Moderate to severe global reduction in LV systolic function; severe LAE; no LAA thrombus  30 day cardiac monitoring arranged by cardiology  LE venous doppler - no DVT  Loyal Jacobson  Virus 2 - negative  LDL - 118  HgbA1c - 5.9  UDS - THCU   VTE prophylaxis - SCDs  No antithrombotic prior to admission, Initially on aspirin 325 and Plavix 75 as DAPT, now will transition to Eliquis at discharge x 3 months with plan to repeat echo at 3 months.   Patient will be counseled to be compliant with her antithrombotic medications  Ongoing aggressive stroke risk factor management  Therapy recommendations: Outpatient OT  Disposition:  Pending    Carotid stenosis  CTA H&N - Left ICA terminus occlusion extending to the proximal left A1/ACA and M1/MCA ( T occlusion).   MRA H&N - Patent Left ICA terminus but hemodynamically significant stenoses of BOTH ICA siphons at the distal cavernous/anterior genu level.  Currently on aspirin 325 and  Plavix 75 DAPT for 3 months  Cardiomyopathy  2D echo showed EF 30 to 35%  Could be potential etiology for embolic infarct  Family hx of heart failure (brothers and dad)  TEE- Moderate to severe global reduction in LV systolic function; severe LAE; no LAA thrombus; thickened MV with mild prolapse of anterior MV leaftle; moderate to severe MR   Cardiology recommending GDMT, including metoprolol succinate and entresto but will initiate slowly due to soft Bps while inpatient and repeat echo in 3 months  Hyperlipidemia  Home Lipid lowering medication: Lipitor 20 mg daily   LDL 118, goal < 70  Current lipid lowering medication: Lipitor 40 mg daily   Continue statin at discharge  THC abuse  Patient stated that she started using THC since she was 70 years old  Long-term THC use also a risk factor for stroke  Cessation education provided  Patient is willing to quit  Other Stroke Risk Factors  Advanced age  ETOH use, advised to drink no more than 1 alcoholic beverage per day.  Family hx stroke (mother)   Hx stroke/TIA - by imaging   Hx of migraines  DISCHARGE EXAM Blood pressure (!) 141/98, pulse 92, temperature  97.7 F (36.5 C), temperature source Oral, resp. rate 20, height 5\' 2"  (1.575 m), weight 62 kg, SpO2 100 %. General - Well nourished, well developed, in no apparent distress. Resp: No extra work of breathing Cardiovascular - Regular rhythm and rate. Ext: No edema   Mental Status -  Alert, oriented x4. Clear and fluent speech.   Naming and repetition are intact.  Cranial Nerves II - XII - II - Visual field intact OU. III, IV, VI - Extraocular movements intact. V - Facial sensation intact bilaterally. VII - Facial movement intact bilaterally. VIII - Hearing & vestibular intact bilaterally. X - Palate elevates symmetrically. XI - Chin turning & shoulder shrug intact bilaterally. XII - Tongue protrusion intact.  Motor Strength - The patient's strength was normal in all extremities and pronator drift was absent.  Bulk was normal and fasciculations were absent.   Motor Tone - Muscle tone was assessed at the neck and appendages and was normal.  Sensory - Light touch, temperature/pinprick were assessed and were symmetrical.    Coordination - The patient had normal movements in the hands with no ataxia or dysmetria.  Tremor was absent.  Gait and Station - deferred.   Discharge Diet       Diet   Diet Heart Room service appropriate? Yes; Fluid consistency: Thin   liquids  DISCHARGE PLAN  Disposition:  Home with outpatient OT  Eliquis started at discharge, planning for 3 months then repeat echo  Ongoing stroke risk factor control by Primary Care Physician at time of discharge  Follow-up PCP Marland Kitchen, Betty G, MD in 2 weeks.  Follow-up in [facility] in 4 weeks, office to schedule an appointment.   45 minutes were spent preparing discharge.  Delila A Bailey-Modzik, NP-C  ATTENDING NOTE: I reviewed above note and agree with the assessment and plan. Pt was seen and examined.   Dr/ [place] at the bedside. Pt had TEE yesterday showed EF 30-35%  with global hypokinesis and severe LAE. Discussed with Dr. Jacques Navy, will initiate eliquis. Repeat echo in 3 months, if EF > 35%, eliquis can be switch back to antiplatelet. Will do 30 day monitoring to rule out afib. Cardiology will start GDMT slowly as outpt. Pt is sitting in bed, neuro intact, eager  to home. Will d/c home with close neuro and cardiac follow up.   For detailed assessment and plan, please refer to above as I have made changes wherever appropriate.   Marvel PlanJindong Nickalus Thornsberry, MD PhD Stroke Neurology 03/19/2021 11:13 PM

## 2021-03-19 NOTE — Discharge Instructions (Signed)
Repeat heart ultrasound is being set up for 3 months after discharge.  Please call your primary care doctor for appointment in 1-2 weeks after discharge to ensure all of your stroke risk factors are under control.

## 2021-03-19 NOTE — Progress Notes (Unsigned)
Patient ID: Leah Pierce, female   DOB: Oct 27, 1951, 70 y.o.   MRN: [id number] Patient enrolled for [employer] to ship a 30 day Cardiac Event Monitor to her home. Letter with instructions mailed to patient.

## 2021-03-19 NOTE — Progress Notes (Signed)
Occupational Therapy Treatment Patient Details Name: Leah Pierce MRN: [id number] DOB: 02-22-1951 Today's Date: 03/19/2021    History of present illness This 70 y.o. female admitted 4/29 with acute onset aphasia and confusion. CTA revealed Lt ICA terminus occlussion.  She was given tPA, but  due to rapid improvement of symptoms, therefore thrombectomy not performed.  She is awaiting MRI.  PMH includes: Migraines, asthma   OT comments  Patient agreeable to OT session, seen for further functional assessment using pill box test.  Patient verbalizing throughout session that she has used pill boxes in the past for family but has never use one for herself.  She completed the pill box with 7 errors, demonstrating decreased multitasking, attention to task.  While completing task, she asked therapist about the 3x/day pill and then only placed in into the pill box 2x/day.  When this was brought to her attention, she was able to correct it without assist; but denied challenges or difficulty with it, reporting "that was the one I asked you about".  Pt agreed that she lost attention to task, but disagreed that she needed supervision with the task at discharge. Therapist highly encouraged use of a pill box, having supervision or double checking her medications after completion of task. Pt resistant to education and reports she can manage her medication at home.    Follow Up Recommendations  Outpatient OT    Equipment Recommendations  None recommended by OT    Recommendations for Other Services      Precautions / Restrictions Precautions Precautions: None       Mobility Bed Mobility                    Transfers                      Balance                                           ADL either performed or assessed with clinical judgement   ADL                                               Vision       Perception     Praxis       Cognition Arousal/Alertness: Awake/alert Behavior During Therapy: WFL for tasks assessed/performed Overall Cognitive Status: Within Functional Limits for tasks assessed       General Comments: Assessed using the Pill Box Test. Pt failed the assessment, demonstrating poor planning, mental flexibility, suboptimal search strategies, concrete thinking and the inability to multitask. Pt had a total of 7 errors, where more than 3 errors is considered a fail.  Errors: One tablet 3x/day (yellow) - 7 errors (omission/misplacement) One tablet 2x/day with breakfast and dinner (green) - 0 errors (omission/misplacement) One tablet in the morning (Blue)- 0 errors (omission/misplacement) One tablet daily at bedtime (orange) - 0 errors(omission/misplacement) One tablet every other day (red) -0 errors (omission/misplacement)   Number of misplaced movement errors (pills placed in incorrect compartment)- 0 Number of total errors (sum of omissions; misplacements) - 7 Total time to complete task (allowed 5 min) - 5 min  General Comments: pill box test completed        Exercises     Shoulder Instructions       General Comments session focused on pill box test see above for details'    Pertinent Vitals/ Pain       Pain Assessment: No/denies pain  Home Living                                          Prior Functioning/Environment              Frequency  Min 2X/week        Progress Toward Goals  OT Goals(current goals can now be found in the care plan section)  Progress towards OT goals: Progressing toward goals  Acute Rehab OT Goals Patient Stated Goal: to go home OT Goal Formulation: With patient  Plan Discharge plan remains appropriate;Frequency remains appropriate    Co-evaluation                 AM-PAC OT "6 Clicks" Daily Activity     Outcome Measure   Help from another person eating meals?: None Help  from another person taking care of personal grooming?: None Help from another person toileting, which includes using toliet, bedpan, or urinal?: None Help from another person bathing (including washing, rinsing, drying)?: None Help from another person to put on and taking off regular upper body clothing?: None Help from another person to put on and taking off regular lower body clothing?: None 6 Click Score: 24    End of Session    OT Visit Diagnosis: Other symptoms and signs involving cognitive function   Activity Tolerance Patient tolerated treatment well   Patient Left in bed;with call bell/phone within reach;with bed alarm set   Nurse Communication          Time: 3790-2409 OT Time Calculation (min): 24 min  Charges: OT General Charges $OT Visit: 1 Visit OT Treatments $Cognitive Funtion inital: Initial 15 mins $Cognitive Funtion additional: Additional15 mins  Leah Pierce, OT Acute Rehabilitation Services Pager [phone] Office [phone]    Chancy Milroy 03/19/2021, 3:53 PM

## 2021-03-19 NOTE — Plan of Care (Signed)

## 2021-03-19 NOTE — Progress Notes (Signed)
30 day monitor for stroke ?

## 2021-03-19 NOTE — Progress Notes (Addendum)
Progress Note  Patient Name: Leah Pierce Date of Encounter: 03/19/2021  Primary Cardiologist: Parke Poisson, MD (initially seen by EP provider on a weekend then Dr. Jacques Navy)  Subjective   Feeling well, eager to go home. We discussed her CHF diagnosis, sodium restriction, 2L fluid restriction, daily weights. She is familiar with a lot of this information from her brother who saw Dr. Gala Romney. She is not sure what the etiology of his CHF was.  Inpatient Medications    Scheduled Meds: . vitamin C  500 mg Oral Daily  . aspirin EC  325 mg Oral Daily  . atorvastatin  40 mg Oral Daily  . budesonide (PULMICORT) nebulizer solution  0.25 mg Nebulization BID  . Chlorhexidine Gluconate Cloth  6 each Topical Daily  . cholecalciferol  1,000 Units Oral Daily  . clopidogrel  75 mg Oral Daily  . insulin aspart  0-9 Units Subcutaneous TID WC  . Living Better with Heart Failure Book   Does not apply Once  . sodium chloride flush  3 mL Intravenous Once   Continuous Infusions: . sodium chloride 125 mL/hr at 03/18/21 1212   PRN Meds: acetaminophen **OR** acetaminophen (TYLENOL) oral liquid 160 mg/5 mL **OR** acetaminophen, albuterol, labetalol, melatonin, senna-docusate   Vital Signs    Vitals:   03/19/21 0504 03/19/21 0755 03/19/21 0817 03/19/21 1146  BP:  134/89  133/86  Pulse:  81 77 92  Resp:  20  20  Temp:  98.2 F (36.8 C)  98.6 F (37 C)  TempSrc:  Oral  Oral  SpO2:  100% 97% 99%  Weight: 62 kg     Height:       No intake or output data in the 24 hours ending 03/19/21 1402 Last 3 Weights 03/19/2021 03/18/2021 03/15/2021  Weight (lbs) 136 lb 11 oz 136 lb 14.5 oz 129 lb 10.1 oz  Weight (kg) 62 kg 62.1 kg 58.8 kg     Telemetry    NSR - Personally Reviewed  Physical Exam   GEN: No acute distress.  HEENT: Normocephalic, atraumatic, sclera non-icteric. Neck: No JVD or bruits. Cardiac: RRR, 2/6 HSM apex, no rubs rubs or gallops.  Respiratory: Clear to auscultation  bilaterally. Breathing is unlabored. GI: Soft, nontender, non-distended, BS +x 4. MS: no deformity. Extremities: No clubbing or cyanosis. No edema. Distal pedal pulses are 2+ and equal bilaterally. Neuro:  AAOx3. Follows commands. Psych:  Responds to questions appropriately with a normal affect.  Labs    High Sensitivity Troponin:  No results for input(s): TROPONINIHS in the last 720 hours.    Cardiac EnzymesNo results for input(s): TROPONINI in the last 168 hours. No results for input(s): TROPIPOC in the last 168 hours.   Chemistry Recent Labs  Lab 03/15/21 1404 03/15/21 1411 03/16/21 0548 03/18/21 0325 03/19/21 0453  NA 137   < > 139 139 137  K 4.0   < > 3.9 3.9 4.2  CL 107   < > 108 104 104  CO2 21*  --  21* 25 23  GLUCOSE 97   < > 105* 104* 101*  BUN 13   < > 11 13 12   CREATININE 1.04*   < > 0.85 0.80 0.78  CALCIUM 9.2  --  8.8* 9.2 9.2  PROT 6.4*  --   --   --   --   ALBUMIN 3.7  --   --   --   --   AST 23  --   --   --   --  ALT 17  --   --   --   --   ALKPHOS 35*  --   --   --   --   BILITOT 0.6  --   --   --   --   GFRNONAA 59*  --  >60 >60 >60  ANIONGAP 9  --  10 10 10    < > = values in this interval not displayed.     Hematology Recent Labs  Lab 03/16/21 0548 03/18/21 0325 03/19/21 0453  WBC 6.4 7.3 5.6  RBC 4.86 5.16* 5.23*  HGB 13.2 14.2 14.3  HCT 40.0 42.4 43.9  MCV 82.3 82.2 83.9  MCH 27.2 27.5 27.3  MCHC 33.0 33.5 32.6  RDW 14.6 14.1 14.3  PLT 191 195 207    BNPNo results for input(s): BNP, PROBNP in the last 168 hours.   DDimer No results for input(s): DDIMER in the last 168 hours.   Radiology    ECHO TEE  Result Date: 03/18/2021    TRANSESOPHOGEAL ECHO REPORT   Patient Name:   Leah Pierce Date of Exam: 03/18/2021 Medical Rec #:  05/18/2021            Height:       62.0 in Accession #:    [id number]           Weight:       136.9 lb Date of Birth:  1951/08/20            BSA:          1.627 m Patient Age:    69 years             BP:            144/95 mmHg Patient Gender: F                    HR:           93 bpm. Exam Location:  Inpatient Procedure: Transesophageal Echo, Color Doppler, Cardiac Doppler and 3D Echo Indications:     Stroke  History:         Patient has prior history of Echocardiogram examinations, most                  recent 03/16/2021.  Sonographer:     03/18/2021 RDCS (AE) Referring Phys:  Thurman Coyer [id number] Diagnosing Phys: Beatriz Stallion MD PROCEDURE: After discussion of the risks and benefits of a TEE, an informed consent was obtained. The transesophogeal probe was passed without difficulty through the esophogus of the patient. Sedation performed by different physician. The patient was monitored while under deep sedation. Anesthestetic sedation was provided intravenously by Anesthesiology: 143.15mg  of Propofol, 60mg  of Lidocaine. The patient developed no complications during the procedure. IMPRESSIONS  1. Left ventricular ejection fraction, by estimation, is 30 to 35%. The left ventricle has moderately decreased function. The left ventricle demonstrates global hypokinesis.  2. Right ventricular systolic function is normal. The right ventricular size is normal.  3. Left atrial size was severely dilated. No left atrial/left atrial appendage thrombus was detected.  4. The mitral valve is abnormal. Moderate to severe mitral valve regurgitation. There is mild late systolic prolapse of the middle segment of the anterior leaflet of the mitral valve.  5. The aortic valve is tricuspid. Aortic valve regurgitation is not visualized.  6. There is mild (Grade II) plaque involving the descending aorta.  7. Evidence of atrial level shunting detected by  color flow Doppler. Agitated saline contrast bubble study was positive with shunting observed after >6 cardiac cycles suggestive of intrapulmonary shunting. FINDINGS  Left Ventricle: Left ventricular ejection fraction, by estimation, is 30 to 35%. The left ventricle has  moderately decreased function. The left ventricle demonstrates global hypokinesis. The left ventricular internal cavity size was normal in size. Right Ventricle: The right ventricular size is normal. Right vetricular wall thickness was not assessed. Right ventricular systolic function is normal. Left Atrium: Left atrial size was severely dilated. No left atrial/left atrial appendage thrombus was detected. Right Atrium: Right atrial size was normal in size. Pericardium: Trivial pericardial effusion is present. Mitral Valve: The mitral valve is abnormal. There is mild late systolic prolapse of the middle segment of the anterior leaflet of the mitral valve. There is mild thickening of the mitral valve leaflet(s). Moderate to severe mitral valve regurgitation. Tricuspid Valve: The tricuspid valve is normal in structure. Tricuspid valve regurgitation is trivial. Aortic Valve: The aortic valve is tricuspid. Aortic valve regurgitation is not visualized. Pulmonic Valve: The pulmonic valve was normal in structure. Pulmonic valve regurgitation is trivial. Aorta: The aortic root is normal in size and structure. There is mild (Grade II) plaque involving the descending aorta. IAS/Shunts: Evidence of atrial level shunting detected by color flow Doppler. Agitated saline contrast bubble study was positive with shunting observed after >6 cardiac cycles suggestive of intrapulmonary shunting.  MR Peak grad:    69.6 mmHg MR Mean grad:    46.0 mmHg MR Vmax:         417.00 cm/s MR Vmean:        317.0 cm/s MR PISA:         2.26 cm MR PISA Eff ROA: 21 mm MR PISA Radius:  0.60 cm Olga Millers MD Electronically signed by Olga Millers MD Signature Date/Time: 03/18/2021/2:37:07 PM    Final     Cardiac Studies   TEE 03/18/21 1. Left ventricular ejection fraction, by estimation, is 30 to 35%. The  left ventricle has moderately decreased function. The left ventricle  demonstrates global hypokinesis.  2. Right ventricular systolic  function is normal. The right ventricular  size is normal.  3. Left atrial size was severely dilated. No left atrial/left atrial  appendage thrombus was detected.  4. The mitral valve is abnormal. Moderate to severe mitral valve  regurgitation. There is mild late systolic prolapse of the middle segment  of the anterior leaflet of the mitral valve.  5. The aortic valve is tricuspid. Aortic valve regurgitation is not  visualized.  6. There is mild (Grade II) plaque involving the descending aorta.  7. Evidence of atrial level shunting detected by color flow Doppler.  Agitated saline contrast bubble study was positive with shunting observed  after >6 cardiac cycles suggestive of intrapulmonary shunting.   2D echo 03/16/21 1. Left ventricular ejection fraction, by estimation, is 30 to 35%. The  left ventricle has moderately decreased function. The left ventricle  demonstrates global hypokinesis. There is moderate concentric left  ventricular hypertrophy. Left ventricular  diastolic parameters are consistent with Grade III diastolic dysfunction  (restrictive). Elevated left atrial pressure.  2. Right ventricular systolic function is normal. The right ventricular  size is normal. There is normal pulmonary artery systolic pressure.  3. Left atrial size was severely dilated.  4. The mitral valve is myxomatous. Moderate to severe mitral valve  regurgitation. There is mild holosystolic prolapse of the middle segment  of the anterior leaflet of the mitral  valve.  5. The aortic valve is tricuspid. Aortic valve regurgitation is not  visualized. No aortic stenosis is present.   Patient Profile     70 y.o. female with HLD, pre-DM, asthma, family history of CHF in multiple siblings who presented to [facility] with Stroke. Also found to have EF 30-35% with moderate-severe MR.  Assessment & Plan   1. Stroke  -per neurology, Left MCA punctate infarct due to left terminal ICA occlusion  status post tPA, etiology likely due to large vessel disease vs. cardiomyopathy with low EF - now on ASA 325mg  and Plavix 75mg  for 3 months - per neuro note, "Will discuss with cardiology to see if [facility] indicated" (historically the data on the decision of stroke + low EF is very blurry with an "individual patient" recommendation) - TEE without LAA thrombus but + severely dilated LA, increasing propensity for PAF - I will arrange a 30 day monitor to be mailed to her house, message sent to office to help arrange   2. Newly recognized cardiomyopathy/acute combined CHF - etiology not clear, will need ischemic workup once recovered from stroke - also note significant family history of CHF (may be coronary CTA candidate) - received Lasix earlier this admission, clinically looks euvolemic today - not yet started on GDMTas she was hypotensive down to the 80s-90s yesterday, BP now improved - will discuss gradual introduction with MD, would need to be careful to avoid aggressively lowering BP post-stroke (per notes, goal SBP in the 130s-150s at this time given bilateral cavernous ICA stenosis and I do not think her current BPs will support addition of multiple agents at once) - rx CHF book - will also add-on baseline TSH to labs, can be reviewed as OP if needed  3. Mitral regurgitation - TEE 03/18/21 showed moderate-severe mitral valve regurgitation with mild late systolic mitral valve prolapse - will review next steps with MD  - reviewed sx of CHF with MD  4. Hyperlipidemia goal LDL <70 - LDL 118 this admission, prompting increase in atorvastatin - If the patient is tolerating statin increase at time of follow-up appointment, would consider rechecking liver function/lipid panel in 6-8 weeks  F/u arranged in the cardiology office 5/18, also sent office message about 30d monitor as above. (Patient aware this will come in mail.)  For questions or updates, please contact [facility] Please consult  [email or web address] for contact info under Cardiology/STEMI.  Signed, 05/18/21, PA-C 03/19/2021, 2:02 PM    Patient seen and examined with Laurann Montana PA-C.  Agree as above, with the following exceptions and changes as noted below. She feels well today and is ready to go home. We discussed mitral valve regurgitation. Gen: NAD, CV: RRR, 2/6 HSM, Lungs: clear, Abd: soft, Extrem: Warm, well perfused, no edema, Neuro/Psych: alert and oriented x 3, normal mood and affect. All available labs, radiology testing, previous records reviewed. She has at least moderate severe MR but suspect it is severe with ambulatory state given anatomic defect. There is probably a flail component to the anterior mitral leaflet.   She will need an ischemic workup. May be good candidate for coronary CTA, will discuss this at close follow up.   I will review her images with structural heart team to determine next best steps.   Discussed with stroke team, ok to use eliquis if indicated from stroke standpoint. I strongly suspect she has PAF with large atrial size, but eliquis will be for indications of stroke and concern for possible  LV etiology. Will recheck TTE in 3 mo. If no afib and EF improved >35%, per stroke team, they would feel comfortable stopping DOAC.   Mildly hypotensive at this time, will not add GDMT to allow for normotension.   Parke PoissonGayatri A Vasilisa Vore, MD 03/19/21 5:00 PM

## 2021-03-19 NOTE — Progress Notes (Signed)
CM provided pt with 30 day free Eliquis card. She is d/ced and has transport home.

## 2021-03-20 ENCOUNTER — Encounter (HOSPITAL_COMMUNITY): Payer: Self-pay | Admitting: Cardiology

## 2021-03-20 LAB — GLUCOSE, CAPILLARY: Glucose-Capillary: 101 mg/dL — ABNORMAL HIGH (ref 70–99)

## 2021-03-21 ENCOUNTER — Telehealth: Payer: Self-pay

## 2021-03-21 NOTE — Telephone Encounter (Signed)
Duplication in TCM telephone communication. L.Ieasha Boerema,LPN

## 2021-03-21 NOTE — Telephone Encounter (Signed)
Transition Care Management Unsuccessful Follow-up Telephone Call  Date of discharge and from where:  03/19/2021  Leah Pierce   Attempts:  2nd Attempt   Reason for unsuccessful TCM follow-up call:  No answer/busy

## 2021-03-22 ENCOUNTER — Telehealth: Payer: Self-pay

## 2021-03-22 NOTE — Telephone Encounter (Cosign Needed)
Transition Care Management Unsuccessful Follow-up Telephone Call  Date of discharge and from where:  03/19/2021  Leah Pierce   Attempts:  2nd Attempt  Reason for unsuccessful TCM follow-up call:  Unable to leave message

## 2021-03-24 ENCOUNTER — Ambulatory Visit (INDEPENDENT_AMBULATORY_CARE_PROVIDER_SITE_OTHER): Payer: Medicare Other

## 2021-03-24 DIAGNOSIS — I639 Cerebral infarction, unspecified: Secondary | ICD-10-CM

## 2021-03-24 DIAGNOSIS — I4891 Unspecified atrial fibrillation: Secondary | ICD-10-CM

## 2021-03-27 ENCOUNTER — Other Ambulatory Visit: Payer: Self-pay

## 2021-03-27 ENCOUNTER — Ambulatory Visit: Payer: Medicare Other | Attending: Neurology | Admitting: Occupational Therapy

## 2021-03-27 DIAGNOSIS — M6281 Muscle weakness (generalized): Secondary | ICD-10-CM | POA: Insufficient documentation

## 2021-03-27 NOTE — Therapy (Signed)
[facility] [address] [place], [place], [address] Phone: [phone]   Fax:  [phone]  Occupational Therapy Evaluation  Patient Details  Name: Leah Pierce MRN: [id number] Date of Birth: 1950-12-01 Referring Provider (OT): Dr. Marvel Plan   Encounter Date: 03/27/2021   OT End of Session - 03/27/21 1140    Visit Number 1    Number of Visits 1    Authorization Type U[employer] MCR    OT Start Time 1100    OT Stop Time 1140    OT Time Calculation (min) 40 min    Activity Tolerance Patient tolerated treatment well    Behavior During Therapy [facility] for tasks assessed/performed           Past Medical History:  Diagnosis Date  . Allergy   . Arthritis   . Asthma    hx of  . Chicken pox   . Heart murmur   . Migraines     Past Surgical History:  Procedure Laterality Date  . BUBBLE STUDY  03/18/2021   Procedure: BUBBLE STUDY;  Surgeon: Lewayne Bunting, MD;  Location: [facility] ENDOSCOPY;  Service: Cardiovascular;;  . CESAREAN SECTION    . COLONOSCOPY    . TEE WITHOUT CARDIOVERSION N/A 03/18/2021   Procedure: TRANSESOPHAGEAL ECHOCARDIOGRAM (TEE);  Surgeon: Lewayne Bunting, MD;  Location: [facility] ENDOSCOPY;  Service: Cardiovascular;  Laterality: N/A;  . TONSILLECTOMY  1959    There were no vitals filed for this visit.   Subjective Assessment - 03/27/21 1108    Subjective  I'm starting cardiac rehab next week    Pertinent History CVA 03/15/21. PMH: Carotid stenosis, cardiomyopathy, HLD, THC abuse    Limitations heart monitor    Currently in Pain? No/denies             [facility] OT Assessment - 03/27/21 0001      Assessment   Medical Diagnosis Lt MCA PUNCTATE INFARCT    Referring Provider (OT) Dr. 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, nature, volunteers in [employer]    Leisure knit, crochet      ADL   ADL comments Pt Independent with all BADLS      IADL   Shopping Takes care of all shopping needs independently    Light Housekeeping Does personal laundry completely;Performs light daily tasks such as dishwashing, bed making    Meal Prep Plans, prepares and serves adequate meals independently    Community Mobility Drives own vehicle    Medication Management Is responsible for taking medication in correct dosages at correct time      Mobility   Mobility Status Independent      Written Expression   Dominant Hand Right    Handwriting --  DENIES CHANGES     Vision - History   Baseline Vision Wears glasses all the time    Visual History --   Cataract surgery (both eyes)   Additional Comments denies changes      Cognition   Cognition Comments Immediate recall 3/3, delayed recall 2/3      Sensation   Light Touch Appears Intact      Coordination   9 Hole Peg Test Right;Left    Right 9 Hole Peg Test 24.15 sec    Left 9 Hole Peg Test 26.43 sec      Edema   Edema none      ROM / Strength   AROM / PROM / Strength AROM;Strength      AROM   Overall AROM Comments BUE AROM WNL'S      Strength   Overall Strength Comments BUE MMT grossly 5/5      Hand Function   Right Hand Grip (lbs) 48.5 lbs    Left Hand Grip (lbs) 41.0 LBS                                       Plan - 03/27/21 1141    Clinical Impression Statement This 70 y.o. female admitted with acute onset aphasia and confusion. CTA revealed Lt ICA  terminus occlussion.  She was given tPA, but  due to rapid improvement of symptoms, therefore thrombectomy not performed.  She is awaiting MRI.  PMH includes: Migraines, asthma. Now presents to OPOT for evaluation following stroke. Pt with no remaining deficits based on today's finding and no further O.T. recommended at this time. Pt to begin cardiac rehab next week.    OT Occupational Profile and History Problem Focused Assessment - Including review of records relating to presenting problem    OT Frequency One time visit    Plan Eval only - no f/u OT recommended at this time    Consulted and Agree with Plan of Care Patient           Patient will benefit from skilled therapeutic intervention in order to improve the following deficits and impairments:           Visit Diagnosis: Muscle weakness (generalized)    Problem List Patient Active Problem List   Diagnosis Date Noted  . Acute CVA (cerebrovascular accident) (HCC)   . Stroke determined by clinical assessment (HCC) 03/15/2021  . Hyperlipidemia 08/08/2020  . Prediabetes 08/08/2020  . Osteoporosis 06/10/2018  . Allergic rhinitis 09/23/2017  . Asthma, intermittent, uncomplicated 09/23/2017    Kelli Churn, OTR/L 03/27/2021, 1:30 PM  [facility] [address] [place], [place], [address] Phone: [phone]   Fax:  [phone]  Name: Leah Pierce MRN: [id number] Date of Birth: 1951-08-17

## 2021-04-02 NOTE — Progress Notes (Addendum)
Cardiology Clinic Note   Patient Name: Leah Pierce Date of Encounter: 04/03/2021  Primary Care Provider:  [place], Betty G, MD Primary Cardiologist:  Parke Poisson, MD  Patient Profile    Leah Pierce 70 year old female presents the clinic today for acute combined CHF.  Past Medical History    Past Medical History:  Diagnosis Date  . Allergy   . Arthritis   . Asthma    hx of  . Chicken pox   . Heart murmur   . Migraines    Past Surgical History:  Procedure Laterality Date  . BUBBLE STUDY  03/18/2021   Procedure: BUBBLE STUDY;  Surgeon: Lewayne Bunting, MD;  Location: [facility] ENDOSCOPY;  Service: Cardiovascular;;  . CESAREAN SECTION    . COLONOSCOPY    . TEE WITHOUT CARDIOVERSION N/A 03/18/2021   Procedure: TRANSESOPHAGEAL ECHOCARDIOGRAM (TEE);  Surgeon: Lewayne Bunting, MD;  Location: [facility] ENDOSCOPY;  Service: Cardiovascular;  Laterality: N/A;  . TONSILLECTOMY  1958    Allergies  Allergies  Allergen Reactions  . Tetracyclines & Related     Rash on hands    History of Present Illness    Leah Pierce has a PMH of CVA, cardiomyopathy, acute combined CHF, mitral regurgitation, hyperlipidemia, prediabetes, and asthma.  She presented to [facility] with CVA 03/15/21- 03/19/21.  Cardiology was consulted for her acute combined CHF with an EF of 30-35% and moderate -severe mitral valve regurgitation.  She received diuresis.  It was felt that she has paroxysmal atrial fibrillation and she was started on apixaban.  Plan for repeat echocardiogram in 3 months.  If no A. fib noted and EF improved to greater than 35% neurology reportedly felt comfortable stopping DOAC.  She was noted to be mildly hypotensive and her medication regimen did not allow GDMT.  Ischemic evaluation was recommended and she was felt to be a good candidate for coronary CTA.  She presents the clinic today for follow-up evaluation states she feels well.  She reports that she does have asthma  and does occasionally have trouble with her breathing.  She did have a respiratory virus 3 to 4 weeks before her CVA.  She does not notice any chest discomfort, lower extremity swelling or weight gain today.  We reviewed her echocardiogram and hospital course.  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  She would like to work more hours.  She states she currently is working about 4 hours/week.  We reviewed her cholesterol.  She reports that she is not taking statin therapy at this time.  I will order atorvastatin, losartan, she may increase her hours at work.  We will order a BMP in 1 week, fasting lipids and LFTs in 8 weeks, and have her follow-up in 1 month.  Today she denies chest pain, shortness of breath, lower extremity edema, fatigue, palpitations, melena, hematuria, hemoptysis, diaphoresis, weakness, presyncope, syncope, orthopnea, and PND.     Home Medications    Prior to Admission medications   Medication Sig Start Date End Date Taking? Authorizing Provider  albuterol (VENTOLIN HFA) 108 (90 Base) MCG/ACT inhaler Inhale 2 puffs into the lungs every 6 (six) hours as needed for wheezing or shortness of breath. 02/06/21   [place], Betty G, MD  alendronate (FOSAMAX) 70 MG tablet Take 1 tablet (70 mg total) by mouth every 7 (seven) days. Take with a full glass of water on an empty stomach. Patient not taking: Reported on 03/27/2021 08/11/20   [place],  Timoteo Expose, MD  apixaban (ELIQUIS) 5 MG TABS tablet Take 1 tablet (5 mg total) by mouth 2 (two) times daily. 03/19/21   Bailey-Modzik, Delila A, NP  atorvastatin (LIPITOR) 20 MG tablet Take 1 tablet (20 mg total) by mouth daily. Patient not taking: Reported on 03/27/2021 08/11/20   [place], Betty G, MD  cholecalciferol (VITAMIN D3) 25 MCG (1000 UNIT) tablet Take 1,000 Units by mouth daily.    [provider]  fluticasone (FLOVENT HFA) 110 MCG/ACT inhaler Inhale 1 puff into the lungs in the morning and at bedtime. 05/04/20   [place], Betty G,  MD  Misc Natural Products (OSTEO BI-FLEX ADV JOINT SHIELD) TABS Take 1 tablet by mouth daily.    [provider]  OVER THE COUNTER MEDICATION Take 1 tablet by mouth daily. NEURIVA    [provider]  Spacer/Aero-Holding Chambers (AEROCHAMBER PLUS) inhaler Use as instructed to use with inahaler. 05/04/20   [place], Betty G, MD  vitamin C (ASCORBIC ACID) 500 MG tablet Take 500 mg by mouth daily.    [provider]  vitamin E 200 UNIT capsule Take 200 Units by mouth daily.    [provider]    Family History    Family History  Problem Relation Age of Onset  . Arthritis Mother   . Stroke Mother   . Hypertension Mother   . Diabetes Mother   . Colon polyps Mother   . Arthritis Father   . Cancer Father        Prostate  . Cancer Brother        prostate  . Cancer Brother        Pancreas  . Colon cancer Neg Hx   . Esophageal cancer Neg Hx   . Rectal cancer Neg Hx   . Stomach cancer Neg Hx    She indicated that her mother is deceased. She indicated that her father is deceased. She indicated that both of her brothers are deceased. She indicated that the status of her neg hx is unknown.  Social History    Social History   Socioeconomic History  . Marital status: Single    Spouse name: Not on file  . Number of children: Not on file  . Years of education: Not on file  . Highest education level: Not on file  Occupational History  . Not on file  Tobacco Use  . Smoking status: Never Smoker  . Smokeless tobacco: Never Used  Vaping Use  . Vaping Use: Never used  Substance and Sexual Activity  . Alcohol use: Yes    Comment: occasionally  . Drug use: Yes    Frequency: 2.0 times per week    Types: Marijuana  . Sexual activity: Not on file  Other Topics Concern  . Not on file  Social History Narrative  . Not on file   Social Determinants of Health   Financial Resource Strain: Not on file  Food Insecurity: Not on file  Transportation Needs:  Not on file  Physical Activity: Not on file  Stress: Not on file  Social Connections: Not on file  Intimate Partner Violence: Not on file     Review of Systems    General:  No chills, fever, night sweats or weight changes.  Cardiovascular:  No chest pain, dyspnea on exertion, edema, orthopnea, palpitations, paroxysmal nocturnal dyspnea. Dermatological: No rash, lesions/masses Respiratory: No cough, dyspnea Urologic: No hematuria, dysuria Abdominal:   No nausea, vomiting, diarrhea, bright red blood per rectum, melena,  or hematemesis Neurologic:  No visual changes, wkns, changes in mental status. All other systems reviewed and are otherwise negative except as noted above.  Physical Exam    VS:  BP 112/80   Pulse 79   Ht 5\' 3"  (1.6 m)   Wt 129 lb (58.5 kg)   SpO2 98%   BMI 22.85 kg/m  , BMI Body mass index is 22.85 kg/m. GEN: Well nourished, well developed, in no acute distress. HEENT: normal. Neck: Supple, no JVD, carotid bruits, or masses. Cardiac: RRR, no murmurs, rubs, or gallops. No clubbing, cyanosis, edema.  Radials/DP/PT 2+ and equal bilaterally.  Respiratory:  Respirations regular and unlabored, clear to auscultation bilaterally. GI: Soft, nontender, nondistended, BS + x 4. MS: no deformity or atrophy. Skin: warm and dry, no rash. Neuro:  Strength and sensation are intact. Psych: Normal affect.  Accessory Clinical Findings    Recent Labs: 03/15/2021: ALT 17 03/19/2021: BUN 12; Creatinine, Ser 0.78; Hemoglobin 14.3; Platelets 207; Potassium 4.2; Sodium 137; TSH 1.930   Recent Lipid Panel    Component Value Date/Time   CHOL 211 (H) 03/16/2021 0548   TRIG 69 03/16/2021 0548   HDL 79 03/16/2021 0548   CHOLHDL 2.7 03/16/2021 0548   VLDL 14 03/16/2021 0548   LDLCALC 118 (H) 03/16/2021 0548    ECG personally reviewed by me today-none today.  Echo 03/18/21 IMPRESSIONS    1. Left ventricular ejection fraction, by estimation, is 30 to 35%. The  left ventricle  has moderately decreased function. The left ventricle  demonstrates global hypokinesis.  2. Right ventricular systolic function is normal. The right ventricular  size is normal.  3. Left atrial size was severely dilated. No left atrial/left atrial  appendage thrombus was detected.  4. The mitral valve is abnormal. Moderate to severe mitral valve  regurgitation. There is mild late systolic prolapse of the middle segment  of the anterior leaflet of the mitral valve.  5. The aortic valve is tricuspid. Aortic valve regurgitation is not  visualized.  6. There is mild (Grade II) plaque involving the descending aorta.  7. Evidence of atrial level shunting detected by color flow Doppler.  Agitated saline contrast bubble study was positive with shunting observed  after >6 cardiac cycles suggestive of intrapulmonary shunting.  Assessment & Plan   1.  Acute combined systolic and diastolic CHF/cardiomyopathy- euvolemic today.  Has been slowly increasing her physical activity.  Noted to be mildly hypotensive while admitted. GDMT not initiated due to CVA.  BP now normalized.  Due to contrast shortage we will hold coronary CTA for now.  Procedure explained to patient. Start losartan 25 mg  Heart healthy low-sodium diet-salty 6 given Increase physical activity as tolerated Repeat BMP in 1 week  Hyperlipidemia-03/16/2021: Cholesterol 211; HDL 79; LDL Cholesterol 118; Triglycerides 69; VLDL 14.  Patient has not yet started atorvastatin Start atorvastatin Heart healthy low-sodium diet-salty 6 given Increase physical activity as tolerated Repeat fasting lipids and LFTs in 8 weeks.  Mitral valve regurgitation-noted to be moderate-severe with TEE 03/18/21 Heart healthy low-sodium diet-salty 6 given Increase physical activity as tolerated Repeat echocardiogram in 3 months.   Disposition: Follow-up with Dr. Jacques Navy or me in 1 month.  Thomasene Ripple. Kingdom Vanzanten NP-C    04/03/2021, 11:50 AM [facility] [address] Office [phone] Fax [phone]  Notice: This dictation was prepared with Dragon dictation along with smaller phrase technology. Any transcriptional errors that result from this process are unintentional and  may not be corrected upon review.  I spent 15 minutes examining this patient, reviewing medications, and using patient centered shared decision making involving her cardiac care.  Prior to her visit I spent greater than 20 minutes reviewing her past medical history,  medications, and prior cardiac tests.

## 2021-04-03 ENCOUNTER — Other Ambulatory Visit: Payer: Self-pay

## 2021-04-03 ENCOUNTER — Ambulatory Visit (INDEPENDENT_AMBULATORY_CARE_PROVIDER_SITE_OTHER): Payer: Medicare Other | Admitting: General Practice

## 2021-04-03 ENCOUNTER — Encounter: Payer: Self-pay | Admitting: General Practice

## 2021-04-03 VITALS — BP 112/80 | HR 79 | Ht 63.0 in | Wt 129.0 lb

## 2021-04-03 DIAGNOSIS — I340 Nonrheumatic mitral (valve) insufficiency: Secondary | ICD-10-CM

## 2021-04-03 DIAGNOSIS — Z79899 Other long term (current) drug therapy: Secondary | ICD-10-CM | POA: Diagnosis not present

## 2021-04-03 DIAGNOSIS — I428 Other cardiomyopathies: Secondary | ICD-10-CM

## 2021-04-03 DIAGNOSIS — I5043 Acute on chronic combined systolic (congestive) and diastolic (congestive) heart failure: Secondary | ICD-10-CM | POA: Diagnosis not present

## 2021-04-03 DIAGNOSIS — E781 Pure hyperglyceridemia: Secondary | ICD-10-CM | POA: Diagnosis not present

## 2021-04-03 MED ORDER — ATORVASTATIN CALCIUM 20 MG PO TABS: 20.0000 mg | ORAL_TABLET | Freq: Every day | ORAL | 3 refills | Status: DC

## 2021-04-03 MED ORDER — LOSARTAN POTASSIUM 25 MG PO TABS: 25.0000 mg | ORAL_TABLET | Freq: Every day | ORAL | 3 refills | Status: DC

## 2021-04-03 NOTE — Patient Instructions (Signed)
Medication Instructions:  START LOSARTAN 25MG  DAILY  ATORVASTATIN 20MG  DAILY *If you need a refill on your cardiac medications before your next appointment, please call your pharmacy*  Lab Work: BMET TODAY AND  IN 8 WEEKS FASTING LIPID AND LFT(JULY 18TH) If you have labs (blood work) drawn today and your tests are completely normal, you will receive your results only by:  MyChart Message (if you have MyChart) OR A paper copy in the mail.  If you have any lab test that is abnormal or we need to change your treatment, we will call you to review the results. You may go to any [facility] that is convenient for you however, we do have a lab in our office that is able to assist you. You DO NOT need an appointment for our lab. The lab is open 8:00am and closes at 4:00pm. Lunch 12:45 - 1:45pm.  Special Instructions OK TO RETURN TO WORK WITH NO RESTRICTIONS  PLEASE READ AND FOLLOW SALTY 6-ATTACHED-1,800 mg daily  Follow-Up: Your next appointment:  1 month(s) In Person with , MD OR IF UNAVAILABLE JESSE CLEAVER, FNP-C  At [facility], you and your health needs are our priority.  As part of our continuing mission to provide you with exceptional heart care, we have created designated Provider Care Teams.  These Care Teams include your primary Cardiologist (physician) and Advanced Practice Providers (APPs -  Physician Assistants and Nurse Practitioners) who all work together to provide you with the care you need, when you need it.  We recommend signing up for the patient portal called "MyChart".  Sign up information is provided on this After Visit Summary.  MyChart is used to connect with patients for Virtual Visits (Telemedicine).  Patients are able to view lab/test results, encounter notes, upcoming appointments, etc.  Non-urgent messages can be sent to your provider as well.   To learn more about what you can do with MyChart, go to [facility].

## 2021-04-04 ENCOUNTER — Telehealth: Payer: Self-pay | Admitting: General Practice

## 2021-04-04 LAB — BASIC METABOLIC PANEL
BUN/Creatinine Ratio: 15 (ref 12–28)
BUN: 16 mg/dL (ref 8–27)
CO2: 23 mmol/L (ref 20–29)
Calcium: 9.6 mg/dL (ref 8.7–10.3)
Chloride: 105 mmol/L (ref 96–106)
Creatinine, Ser: 1.08 mg/dL — ABNORMAL HIGH (ref 0.57–1.00)
Glucose: 95 mg/dL (ref 65–99)
Potassium: 4.5 mmol/L (ref 3.5–5.2)
Sodium: 145 mmol/L — ABNORMAL HIGH (ref 134–144)
eGFR: 56 mL/min/{1.73_m2} — ABNORMAL LOW (ref >59)

## 2021-04-04 NOTE — Telephone Encounter (Signed)
Follow up:   Patient is returning a call back. Please call patient. Please call back.

## 2021-04-04 NOTE — Telephone Encounter (Signed)
See result note.  

## 2021-04-08 ENCOUNTER — Encounter: Payer: Self-pay | Admitting: Family Medicine

## 2021-04-08 ENCOUNTER — Ambulatory Visit (INDEPENDENT_AMBULATORY_CARE_PROVIDER_SITE_OTHER): Payer: Medicare Other | Admitting: Family Medicine

## 2021-04-08 ENCOUNTER — Other Ambulatory Visit: Payer: Self-pay

## 2021-04-08 VITALS — BP 140/86 | HR 89 | Temp 98.0°F | Resp 12 | Ht 63.0 in | Wt 129.4 lb

## 2021-04-08 DIAGNOSIS — E785 Hyperlipidemia, unspecified: Secondary | ICD-10-CM

## 2021-04-08 DIAGNOSIS — R7303 Prediabetes: Secondary | ICD-10-CM | POA: Diagnosis not present

## 2021-04-08 DIAGNOSIS — I6310 Cerebral infarction due to embolism of unspecified precerebral artery: Secondary | ICD-10-CM

## 2021-04-08 DIAGNOSIS — I340 Nonrheumatic mitral (valve) insufficiency: Secondary | ICD-10-CM | POA: Diagnosis not present

## 2021-04-08 DIAGNOSIS — I5020 Unspecified systolic (congestive) heart failure: Secondary | ICD-10-CM | POA: Diagnosis not present

## 2021-04-08 NOTE — Assessment & Plan Note (Signed)
Continue a healthy lifestyle for diabetes prevention. We will check A1c next visit.

## 2021-04-08 NOTE — Assessment & Plan Note (Signed)
LDL is not at goal. Continue atorvastatin 20 mg daily. We will plan on checking lipid panel next visit.

## 2021-04-08 NOTE — Assessment & Plan Note (Addendum)
S/P tPA, no residual deficit. Continue Eliquis 5 mg twice daily and atorvastatin 20 mg daily. She has appointment with neurologist in 04/2021.

## 2021-04-08 NOTE — Assessment & Plan Note (Addendum)
Recently dx'ed, she has been asymptomatic. Euvolemic. Due to episodes of hypotension, she is not on beta-blocker,diuretic, or ACEI/ARB. SGLT 2 inh will be considered next visit. Continue low-salt diet.

## 2021-04-08 NOTE — Patient Instructions (Addendum)
A few things to remember from today's visit:  If you need refills please call your pharmacy. Do not use My Chart to request refills or for acute issues that need immediate attention.   No changes today. Next visit we can check labs. Keep appt with cardiologist.  Please be sure medication list is accurate. If a new problem present, please set up appointment sooner than planned today.

## 2021-04-08 NOTE — Progress Notes (Signed)
HPI: Leah Pierce is a 70 y.o. female, who is here today to follow on recent hospitalization. She was hospitalized from 03/15/2021 to 03/19/2021. Unsuccessful transition of care management call x 2.  Otherwise healthy previous to hospitalization and recently Dx'd with HFrEF,carotid stenosis,moderate to severe MR, and CVA.   She presented to the ER via EMS with 10 min onset of expressive aphasia and right-sided mouth droop noted by co-workers when she was at work S/P TPA. She has no residual symptoms. She is on Eliquis 5 mg bid.  CAT head and neck 03/15/21: 1. Left ICA terminus occlusion extending to the proximal left A1/ACA and M1/MCA ( T occlusion). There is good leptomeningeal collateral flow to the left MCA vascular tree and filling of the left ACA via anterior communicating artery. 2. High cervical right internal carotid artery bifurcation corresponding to origin of a type 1 proatlantal artery. 3. Diminutive intracranial vertebral arteries mostly supplying the bilateral PICA territories.  03/16/21 head MRI: Three punctate acute infarctions in the left MCA territory, 1 affecting the deep insular cortex on the left, 1 affecting the white matter of the corona radiography on the left and 1 affecting the left parietal vertex cortex. No large confluent infarction. No swelling or hemorrhage.  Echo TEE on 03/18/21: LVEF 30-35%,global hypokinesis,severe LAE,diastolic dysfunction grade II,and moderate to severe MR.  No orthopnea or PND. Initially stated on Losartan, d/c due to hypotension.  She is not aware of congenital heart disease. States that she was told that she was born with "bad asthma" and was advised to "always" let doctors know.  Denies severe/frequent headache, visual changes, chest pain, dyspnea, palpitation, or edema.  HLD on Atorvastatin 20 mg daily. She has tolerated medication well.  Lab Results  Component Value Date   CHOL 211 (H) 03/16/2021   HDL 79  03/16/2021   LDLCALC 118 (H) 03/16/2021   TRIG 69 03/16/2021   CHOLHDL 2.7 03/16/2021   No Hx of CKD. She has not noted foam in urine,gross hematuria,or decreased urine output. No hx of HTN or diabetes. She follows a low salt and healthful diet.  She is back to work, decreased number of hours, she is afraid of aggravating recent health problems by working longer hours. Still feeling mildly fatigue at the end of the day.  Lab Results  Component Value Date   CREATININE 1.08 (H) 04/03/2021   BUN 16 04/03/2021   NA 145 (H) 04/03/2021   K 4.5 04/03/2021   CL 105 04/03/2021   CO2 23 04/03/2021   Upset because she was having frequent fingerstick's in the hospital dn given insulin. Hx of prediabetes. Negative for polydipsia,polyuria, or polyphagia.  Lab Results  Component Value Date   HGBA1C 5.9 (H) 03/16/2021   Review of Systems  Constitutional: Positive for fatigue. Negative for activity change, appetite change and fever.  HENT: Negative for mouth sores, nosebleeds and sore throat.   Respiratory: Negative for cough and wheezing.   Gastrointestinal: Negative for abdominal pain, nausea and vomiting.       Negative for changes in bowel habits.  Endocrine: Negative for cold intolerance and heat intolerance.  Genitourinary: Negative for decreased urine volume and hematuria.  Musculoskeletal: Negative for gait problem and myalgias.  Skin: Negative for pallor and rash.  Neurological: Negative for syncope, facial asymmetry and weakness.  Psychiatric/Behavioral: Negative for confusion and sleep disturbance.  Rest see pertinent positives and negatives per HPI.  Current Outpatient Medications on File Prior to Visit  Medication Sig  Dispense Refill  . albuterol (VENTOLIN HFA) 108 (90 Base) MCG/ACT inhaler Inhale 2 puffs into the lungs every 6 (six) hours as needed for wheezing or shortness of breath. 18 g 3  . alendronate (FOSAMAX) 70 MG tablet Take 1 tablet (70 mg total) by mouth every 7  (seven) days. Take with a full glass of water on an empty stomach. 12 tablet 3  . apixaban (ELIQUIS) 5 MG TABS tablet Take 1 tablet (5 mg total) by mouth 2 (two) times daily. 60 tablet 2  . atorvastatin (LIPITOR) 20 MG tablet Take 1 tablet (20 mg total) by mouth daily. 30 tablet 3  . cholecalciferol (VITAMIN D3) 25 MCG (1000 UNIT) tablet Take 1,000 Units by mouth daily.    . fluticasone (FLOVENT HFA) 110 MCG/ACT inhaler Inhale 1 puff into the lungs in the morning and at bedtime. 1 Inhaler 3  . Misc Natural Products (OSTEO BI-FLEX ADV JOINT SHIELD) TABS Take 1 tablet by mouth daily.    Marland Kitchen OVER THE COUNTER MEDICATION Take 1 tablet by mouth daily. NEURIVA    . Spacer/Aero-Holding Chambers (AEROCHAMBER PLUS) inhaler Use as instructed to use with inahaler. 1 each 1  . vitamin C (ASCORBIC ACID) 500 MG tablet Take 500 mg by mouth daily.    . vitamin E 200 UNIT capsule Take 200 Units by mouth daily.    Marland Kitchen losartan (COZAAR) 25 MG tablet Take 1 tablet (25 mg total) by mouth daily. (Patient not taking: Reported on 04/08/2021) 30 tablet 3   No current facility-administered medications on file prior to visit.     Past Medical History:  Diagnosis Date  . Allergy   . Arthritis   . Asthma    hx of  . Chicken pox   . Heart murmur   . Migraines    Allergies  Allergen Reactions  . Tetracyclines & Related     Rash on hands    Social History   Socioeconomic History  . Marital status: Single    Spouse name: Not on file  . Number of children: Not on file  . Years of education: Not on file  . Highest education level: Not on file  Occupational History  . Not on file  Tobacco Use  . Smoking status: Never Smoker  . Smokeless tobacco: Never Used  Vaping Use  . Vaping Use: Never used  Substance and Sexual Activity  . Alcohol use: Yes    Comment: occasionally  . Drug use: Yes    Frequency: 2.0 times per week    Types: Marijuana  . Sexual activity: Not on file  Other Topics Concern  . Not on  file  Social History Narrative  . Not on file   Social Determinants of Health   Financial Resource Strain: Not on file  Food Insecurity: Not on file  Transportation Needs: Not on file  Physical Activity: Not on file  Stress: Not on file  Social Connections: Not on file    Vitals:   04/08/21 1105  BP: 140/86  Pulse: 89  Resp: 12  Temp: 98 F (36.7 C)  SpO2: 98%   Body mass index is 22.92 kg/m.  Physical Exam Vitals and nursing note reviewed.  Constitutional:      General: She is not in acute distress.    Appearance: She is well-developed and normal weight.  HENT:     Head: Normocephalic and atraumatic.     Mouth/Throat:     Mouth: Mucous membranes are moist.  Eyes:  Conjunctiva/sclera: Conjunctivae normal.     Pupils: Pupils are equal, round, and reactive to light.  Neck:     Vascular: No JVD.  Cardiovascular:     Rate and Rhythm: Normal rate and regular rhythm.     Pulses:          Dorsalis pedis pulses are 2+ on the right side and 2+ on the left side.     Heart sounds: No murmur heard.     Comments: I do not appreciate heart murmur. Pulmonary:     Effort: Pulmonary effort is normal. No respiratory distress.     Breath sounds: Normal breath sounds.  Abdominal:     Palpations: Abdomen is soft. There is no hepatomegaly or mass.     Tenderness: There is no abdominal tenderness.  Lymphadenopathy:     Cervical: No cervical adenopathy.  Skin:    General: Skin is warm.     Findings: No erythema or rash.  Neurological:     General: No focal deficit present.     Mental Status: She is alert and oriented to person, place, and time.     Cranial Nerves: No cranial nerve deficit.     Gait: Gait normal.  Psychiatric:     Comments: Well groomed, good eye contact.   ASSESSMENT AND PLAN:  Ms.Siarah was seen today for hospitalization follow-up.  Diagnoses and all orders for this visit:  Moderate to severe mitral regurgitation Asymptomatic. Following with  cardiologist.  CVA (cerebral vascular accident) (HCC) S/P tPA, no residual deficit. Continue Eliquis 5 mg twice daily and atorvastatin 20 mg daily. She has appointment with neurologist in 04/2021.  Hyperlipidemia LDL goal <70 LDL is not at goal. Continue atorvastatin 20 mg daily. We will plan on checking lipid panel next visit.  Prediabetes Continue a healthy lifestyle for diabetes prevention. We will check A1c next visit.  HFrEF (heart failure with reduced ejection fraction) (HCC) Recently dx'ed, she has been asymptomatic. Euvolemic. Due to episodes of hypotension, she is not on beta-blocker,diuretic, or ACEI/ARB. SGLT 2 inh will be considered next visit. Continue low-salt diet.  Spent 45 minutes.  During this time history was obtained and documented, examination was performed, prior labs/imaging reviewed, and assessment/plan discussed.  Not sure about CHF etiology. ? Congenital heart disease.  She has appt with cardiologist on 05/07/21 and neuro on 05/28/21.  Return in about 2 months (around 06/08/2021).  Azula Zappia G. [place], MD  [facility]. [place] office.   A few things to remember from today's visit:  If you need refills please call your pharmacy. Do not use My Chart to request refills or for acute issues that need immediate attention.   No changes today. Next visit we can check labs. Keep appt with cardiologist.  Please be sure medication list is accurate. If a new problem present, please set up appointment sooner than planned today.

## 2021-04-24 ENCOUNTER — Other Ambulatory Visit: Payer: Self-pay | Admitting: Physician Assistant

## 2021-04-24 DIAGNOSIS — I639 Cerebral infarction, unspecified: Secondary | ICD-10-CM

## 2021-04-24 DIAGNOSIS — I4891 Unspecified atrial fibrillation: Secondary | ICD-10-CM

## 2021-05-06 NOTE — Progress Notes (Signed)
Cardiology Clinic Note   Patient Name: Leah Pierce Date of Encounter: 05/07/2021  Primary Care Provider:  [place], Betty G, MD Primary Cardiologist:  Parke Poisson, MD  Patient Profile    Leah Pierce 70 year old female presents the clinic today for follow-up of Leah Pierce acute combined CHF.  Past Medical History    Past Medical History:  Diagnosis Date   Allergy    Arthritis    Asthma    hx of   Chicken pox    Heart murmur    Migraines    Past Surgical History:  Procedure Laterality Date   BUBBLE STUDY  03/18/2021   Procedure: BUBBLE STUDY;  Surgeon: Lewayne Bunting, MD;  Location: [facility] ENDOSCOPY;  Service: Cardiovascular;;   CESAREAN SECTION     COLONOSCOPY     TEE WITHOUT CARDIOVERSION N/A 03/18/2021   Procedure: TRANSESOPHAGEAL ECHOCARDIOGRAM (TEE);  Surgeon: Lewayne Bunting, MD;  Location: [facility] ENDOSCOPY;  Service: Cardiovascular;  Laterality: N/A;   TONSILLECTOMY  1959    Allergies  Allergies  Allergen Reactions   Tetracyclines & Related     Rash on hands    History of Present Illness    Leah Pierce has a PMH of CVA, cardiomyopathy, acute combined CHF, mitral regurgitation, hyperlipidemia, prediabetes, and asthma.   She presented to [facility] with CVA 03/15/21- 03/19/21.  Cardiology was consulted for Leah Pierce acute combined CHF with an EF of 30-35% and moderate -severe mitral valve regurgitation.  She received diuresis.  It was felt that she has paroxysmal atrial fibrillation and she was started on apixaban.  Plan for repeat echocardiogram in 3 months.  If no A. fib noted and EF improved to greater than 35% neurology reportedly felt comfortable stopping DOAC.  She was noted to be mildly hypotensive and Leah Pierce medication regimen did not allow GDMT.   Ischemic evaluation was recommended and she was felt to be a good candidate for coronary CTA.   She presented to the clinic  04/03/2021 for follow-up evaluation stated she felt well.  She reported that she  did have asthma and did occasionally have trouble with Leah Pierce breathing.  She had a respiratory virus 3 to 4 weeks before Leah Pierce CVA.  She did not notice any chest discomfort, lower extremity swelling or weight gain.  We reviewed Leah Pierce echocardiogram and hospital course.  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  She wanted  to work more hours.  She stated she was working about 4 hours/week.  We reviewed Leah Pierce cholesterol.  She reported that she was not taking statin therapy at the time.  I ordered atorvastatin, losartan, gave permission to increase Leah Pierce hours at work.  Order a BMP in 1 week, fasting lipids and LFTs in 8 weeks, and have Leah Pierce follow-up in 1 month.  Leah Pierce BMP 04/03/2021 showed a slightly elevated creatinine at 1.08.  Leah Pierce losartan was held.  She presents to the clinic today for follow-up evaluation states she feels well.  She has had some trouble with with sleeping.  She reports that she wakes up early every morning and that she does have some trouble falling asleep and staying asleep at night.  She notices that when she does not sleep well Leah Pierce blood pressure is elevated.  We reviewed Leah Pierce home blood pressures which have been ranging 120s-130s over 80s.  We also reviewed Leah Pierce cardiac event monitor.  She continues to work part-time at [employer].  She worked an 8-hour day yesterday and  feels well today.  I will order a BMP, plan to start Leah Pierce losartan after reviewing Leah Pierce labs, repeat an echocardiogram in 3 months, have Leah Pierce maintain a blood pressure log, give sleep hygiene information, and have Leah Pierce follow-up in 3-4 months.   Today she denies chest pain, shortness of breath, lower extremity edema, fatigue, palpitations, melena, hematuria, hemoptysis, diaphoresis, weakness, presyncope, syncope, orthopnea, and PND.    Home Medications    Prior to Admission medications   Medication Sig Start Date End Date Taking? Authorizing Provider  albuterol (VENTOLIN HFA) 108 (90 Base) MCG/ACT inhaler  Inhale 2 puffs into the lungs every 6 (six) hours as needed for wheezing or shortness of breath. 02/06/21   [place], Betty G, MD  alendronate (FOSAMAX) 70 MG tablet Take 1 tablet (70 mg total) by mouth every 7 (seven) days. Take with a full glass of water on an empty stomach. 08/11/20   [place], Betty G, MD  apixaban (ELIQUIS) 5 MG TABS tablet Take 1 tablet (5 mg total) by mouth 2 (two) times daily. 03/19/21   Bailey-Modzik, Delila A, NP  atorvastatin (LIPITOR) 20 MG tablet Take 1 tablet (20 mg total) by mouth daily. 04/03/21 05/03/21  Ronney Asters, NP  cholecalciferol (VITAMIN D3) 25 MCG (1000 UNIT) tablet Take 1,000 Units by mouth daily.    [provider]  fluticasone (FLOVENT HFA) 110 MCG/ACT inhaler Inhale 1 puff into the lungs in the morning and at bedtime. 05/04/20   [place], Betty G, MD  losartan (COZAAR) 25 MG tablet Take 1 tablet (25 mg total) by mouth daily. Patient not taking: Reported on 04/08/2021 04/03/21 05/03/21  Ronney Asters, NP  Misc Natural Products (OSTEO BI-FLEX ADV JOINT SHIELD) TABS Take 1 tablet by mouth daily.    [provider]  OVER THE COUNTER MEDICATION Take 1 tablet by mouth daily. NEURIVA    [provider]  Spacer/Aero-Holding Chambers (AEROCHAMBER PLUS) inhaler Use as instructed to use with inahaler. 05/04/20   [place], Betty G, MD  vitamin C (ASCORBIC ACID) 500 MG tablet Take 500 mg by mouth daily.    [provider]  vitamin E 200 UNIT capsule Take 200 Units by mouth daily.    [provider]    Family History    Family History  Problem Relation Age of Onset   Arthritis Mother    Stroke Mother    Hypertension Mother    Diabetes Mother    Colon polyps Mother    Arthritis Father    Cancer Father        Prostate   Cancer Brother        prostate   Cancer Brother        Pancreas   Colon cancer Neg Hx    Esophageal cancer Neg Hx    Rectal cancer Neg Hx    Stomach cancer Neg Hx    She indicated that Leah Pierce mother is  deceased. She indicated that Leah Pierce father is deceased. She indicated that both of Leah Pierce brothers are deceased. She indicated that the status of Leah Pierce neg hx is unknown.  Social History    Social History   Socioeconomic History   Marital status: Single    Spouse name: Not on file   Number of children: Not on file   Years of education: Not on file   Highest education level: Not on file  Occupational History   Not on file  Tobacco Use   Smoking status: Never   Smokeless tobacco: Never  Vaping Use   Vaping Use: Never used  Substance and Sexual Activity   Alcohol use: Yes    Comment: occasionally   Drug use: Yes    Frequency: 2.0 times per week    Types: Marijuana   Sexual activity: Not on file  Other Topics Concern   Not on file  Social History Narrative   Not on file   Social Determinants of Health   Financial Resource Strain: Not on file  Food Insecurity: Not on file  Transportation Needs: Not on file  Physical Activity: Not on file  Stress: Not on file  Social Connections: Not on file  Intimate Partner Violence: Not on file     Review of Systems    General:  No chills, fever, night sweats or weight changes.  Cardiovascular:  No chest pain, dyspnea on exertion, edema, orthopnea, palpitations, paroxysmal nocturnal dyspnea. Dermatological: No rash, lesions/masses Respiratory: No cough, dyspnea Urologic: No hematuria, dysuria Abdominal:   No nausea, vomiting, diarrhea, bright red blood per rectum, melena, or hematemesis Neurologic:  No visual changes, wkns, changes in mental status. All other systems reviewed and are otherwise negative except as noted above.  Physical Exam    VS:  BP 124/84 (BP Location: Left Arm)   Pulse 80   Ht 5\' 3"  (1.6 m)   Wt 130 lb 3.2 oz (59.1 kg)   SpO2 99%   BMI 23.06 kg/m  , BMI Body mass index is 23.06 kg/m. GEN: Well nourished, well developed, in no acute distress. HEENT: normal. Neck: Supple, no JVD, carotid bruits, or  masses. Cardiac: RRR, no murmurs, rubs, or gallops. No clubbing, cyanosis, edema.  Radials/DP/PT 2+ and equal bilaterally.  Respiratory:  Respirations regular and unlabored, clear to auscultation bilaterally. GI: Soft, nontender, nondistended, BS + x 4. MS: no deformity or atrophy. Skin: warm and dry, no rash. Neuro:  Strength and sensation are intact. Psych: Normal affect.  Accessory Clinical Findings    Recent Labs: 03/15/2021: ALT 17 03/19/2021: Hemoglobin 14.3; Platelets 207; TSH 1.930 04/03/2021: BUN 16; Creatinine, Ser 1.08; Potassium 4.5; Sodium 145   Recent Lipid Panel    Component Value Date/Time   CHOL 211 (H) 03/16/2021 0548   TRIG 69 03/16/2021 0548   HDL 79 03/16/2021 0548   CHOLHDL 2.7 03/16/2021 0548   VLDL 14 03/16/2021 0548   LDLCALC 118 (H) 03/16/2021 0548    ECG personally reviewed by me today-none today.  Echocardiogram 03/18/2021 IMPRESSIONS     1. Left ventricular ejection fraction, by estimation, is 30 to 35%. The  left ventricle has moderately decreased function. The left ventricle  demonstrates global hypokinesis.   2. Right ventricular systolic function is normal. The right ventricular  size is normal.   3. Left atrial size was severely dilated. No left atrial/left atrial  appendage thrombus was detected.   4. The mitral valve is abnormal. Moderate to severe mitral valve  regurgitation. There is mild late systolic prolapse of the middle segment  of the anterior leaflet of the mitral valve.   5. The aortic valve is tricuspid. Aortic valve regurgitation is not  visualized.   6. There is mild (Grade II) plaque involving the descending aorta.   7. Evidence of atrial level shunting detected by color flow Doppler.  Agitated saline contrast bubble study was positive with shunting observed  after >6 cardiac cycles suggestive of intrapulmonary shunting.  Cardiac event monitor 04/24/2021 Indication: stroke   Minimum HR (bpm): 58 Maximum HR (bpm): 150  Supraventricular Ectopy: <1%. Brief atrial run. SVT: none   Ventricular Ectopy: <1% NSVT: none Ventricular Tachycardia: none   Pauses: none AV block: none   Atrial fibrillation: none   Diary events: none   IMPRESSION: No atrial fibrillation in setting of recent stroke.    Assessment & Plan   1.   Acute combined systolic and diastolic CHF/cardiomyopathy-continues to be euvolemic.  Continues to slowly increasing Leah Pierce physical activity.  Noted to be mildly hypotensive while admitted. GDMT not initiated due to CVA.    Plan to start losartan 25 mg based on BMP Heart healthy low-sodium diet-salty 6 given Increase physical activity as tolerated Repeat BMP    Hyperlipidemia-03/16/2021: Cholesterol 211; HDL 79; LDL Cholesterol 118; Triglycerides 69; VLDL 14.   Continue atorvastatin Heart healthy low-sodium diet-salty 6 given Increase physical activity as tolerated Repeat fasting lipids and LFTs in 4 weeks.   Mitral valve regurgitation-noted to be moderate-severe with TEE 03/18/21 Heart healthy low-sodium diet-salty 6 given Increase physical activity as tolerated Repeat echocardiogram in 3 months.   Sleep disturbance-reports intermittent episodes of trouble sleeping. Sleep hygiene instructions given   Disposition: Follow-up with Dr. Jacques Navy or me in 3-4 months after echocardiogram.   Thomasene Ripple. Houston Surges NP-C    05/07/2021, 9:59 AM [facility] [address] Office [phone] Fax [phone]  Notice: This dictation was prepared with Dragon dictation along with smaller phrase technology. Any transcriptional errors that result from this process are unintentional and may not be corrected upon review.  I spent 13 minutes examining this patient, reviewing medications, and using patient centered shared decision making involving Leah Pierce cardiac care.  Prior to Leah Pierce visit I spent greater than 20 minutes reviewing Leah Pierce past medical history,  medications,  and prior cardiac tests.

## 2021-05-07 ENCOUNTER — Other Ambulatory Visit: Payer: Self-pay

## 2021-05-07 ENCOUNTER — Encounter: Payer: Self-pay | Admitting: General Practice

## 2021-05-07 ENCOUNTER — Ambulatory Visit (INDEPENDENT_AMBULATORY_CARE_PROVIDER_SITE_OTHER): Payer: Medicare Other | Admitting: General Practice

## 2021-05-07 VITALS — BP 124/84 | HR 80 | Ht 63.0 in | Wt 130.2 lb

## 2021-05-07 DIAGNOSIS — I340 Nonrheumatic mitral (valve) insufficiency: Secondary | ICD-10-CM

## 2021-05-07 DIAGNOSIS — G479 Sleep disorder, unspecified: Secondary | ICD-10-CM

## 2021-05-07 DIAGNOSIS — E781 Pure hyperglyceridemia: Secondary | ICD-10-CM | POA: Diagnosis not present

## 2021-05-07 DIAGNOSIS — I5043 Acute on chronic combined systolic (congestive) and diastolic (congestive) heart failure: Secondary | ICD-10-CM | POA: Diagnosis not present

## 2021-05-07 DIAGNOSIS — Z79899 Other long term (current) drug therapy: Secondary | ICD-10-CM

## 2021-05-07 DIAGNOSIS — I428 Other cardiomyopathies: Secondary | ICD-10-CM

## 2021-05-07 LAB — BASIC METABOLIC PANEL
BUN/Creatinine Ratio: 26 (ref 12–28)
BUN: 18 mg/dL (ref 8–27)
CO2: 24 mmol/L (ref 20–29)
Calcium: 9.4 mg/dL (ref 8.7–10.3)
Chloride: 102 mmol/L (ref 96–106)
Creatinine, Ser: 0.69 mg/dL (ref 0.57–1.00)
Glucose: 99 mg/dL (ref 65–99)
Potassium: 4.5 mmol/L (ref 3.5–5.2)
Sodium: 139 mmol/L (ref 134–144)
eGFR: 94 mL/min/{1.73_m2} (ref >59)

## 2021-05-07 NOTE — Patient Instructions (Signed)
Medication Instructions:  The current medical regimen is effective;  continue present plan and medications as directed. Please refer to the Current Medication list given to you today.  *If you need a refill on your cardiac medications before your next appointment, please call your pharmacy*  Lab Work: BMET TODAY -AND- FASTING LIPID AND LFT IN 4 WEEKS (06-06-2021) If you have labs (blood work) drawn today and your tests are completely normal, you will receive your results only by:  MyChart Message (if you have MyChart) OR A paper copy in the mail.  If you have any lab test that is abnormal or we need to change your treatment, we will call you to review the results. You may go to any [facility] that is convenient for you however, we do have a lab in our office that is able to assist you. You DO NOT need an appointment for our lab. The lab is open 8:00am and closes at 4:00pm. Lunch 12:45 - 1:45pm.  Testing/Procedures: Echocardiogram(IN 3 MONTHS) - Your physician has requested that you have an echocardiogram. Echocardiography is a painless test that uses sound waves to create images of your heart. It provides your doctor with information about the size and shape of your heart and how well your heart's chambers and valves are working. This procedure takes approximately one hour. There are no restrictions for this procedure. This will be performed at our [facility] location - [address], [address].  Special Instructions PLEASE READ AND FOLLOW SLEEP TIPS-ATTACHED   WE WILL CALL WITH LAB RESULTS TO DISCUSS THE LOSARTAN  Follow-Up: Your next appointment:  AFTER ECHO  In Person with Weston Brass, MD ONLY  At [facility], you and your health needs are our priority.  As part of our continuing mission to provide you with exceptional heart care, we have created designated Provider Care Teams.  These Care Teams include your primary Cardiologist (physician) and Advanced Practice Providers (APPs -   Physician Assistants and Nurse Practitioners) who all work together to provide you with the care you need, when you need it.  We recommend signing up for the patient portal called "MyChart".  Sign up information is provided on this After Visit Summary.  MyChart is used to connect with patients for Virtual Visits (Telemedicine).  Patients are able to view lab/test results, encounter notes, upcoming appointments, etc.  Non-urgent messages can be sent to your provider as well.   To learn more about what you can do with MyChart, go to [email or web address].    Quality Sleep Information, Adult Quality sleep is important for your mental and physical health. It also improves your quality of life. Quality sleep means you: Are asleep for most of the time you are in bed. Fall asleep within 30 minutes. Wake up no more than once a night.  Are awake for no longer than 20 minutes if you do wake up during the night. Most adults need 7-8 hours of quality sleep each night. How can poor sleep affect me? If you do not get enough quality sleep, you may have: Mood swings. Daytime sleepiness. Confusion. Decreased reaction time. Sleep disorders, such as insomnia and sleep apnea. Difficulty with: Solving problems. Coping with stress. Paying attention. These issues may affect your performance and productivity at work, school, and at home. Lack of sleep may also put you at higher risk for accidents, suicide,and risky behaviors. If you do not get quality sleep you may also be at higher risk for several health problems,  including: Infections. Type 2 diabetes. Heart disease. High blood pressure. Obesity. Worsening of long-term conditions, like arthritis, kidney disease, depression, Parkinson's disease, and epilepsy. What actions can I take to get more quality sleep?     Stick to a sleep schedule. Go to sleep and wake up at about the same time each day. Do not try to sleep less on weekdays and make up for  lost sleep on weekends. This does not work. Try to get about 30 minutes of exercise on most days. Do not exercise 2-3 hours before going to bed. Limit naps during the day to 30 minutes or less. Do not use any products that contain nicotine or tobacco, such as cigarettes or e-cigarettes. If you need help quitting, ask your health care provider. Do not drink caffeinated beverages for at least 8 hours before going to bed. Coffee, tea, and some sodas contain caffeine. Do not drink alcohol close to bedtime. Do not eat large meals close to bedtime. Do not take naps in the late afternoon. Try to get at least 30 minutes of sunlight every day. Morning sunlight is best. Make time to relax before bed. Reading, listening to music, or taking a hot bath promotes quality sleep. Make your bedroom a place that promotes quality sleep. Keep your bedroom dark, quiet, and at a comfortable room temperature. Make sure your bed is comfortable. Take out sleep distractions like TV, a computer, smartphone, and bright lights. If you are lying awake in bed for longer than 20 minutes, get up and do a relaxing activity until you feel sleepy. Work with your health care provider to treat medical conditions that may affect sleeping, such as: Nasal obstruction. Snoring. Sleep apnea and other sleep disorders. Talk to your health care provider if you think any of your prescription medicines may cause you to have difficulty falling or staying asleep. If you have sleep problems, talk with a sleep consultant. If you think you have a sleep disorder, talk with your health care provider about getting evaluated by a specialist. Where to find more information National Sleep Foundation website: https://sleepfoundation.org National Heart, Lung, and Blood Institute (NHLBI): https://hall.info/.pdf Centers for Disease Control and Prevention (CDC): DetailSports.is Contact a health care provider  if you: Have trouble getting to sleep or staying asleep. Often wake up very early in the morning and cannot get back to sleep. Have daytime sleepiness. Have daytime sleep attacks of suddenly falling asleep and sudden muscle weakness (narcolepsy). Have a tingling sensation in your legs with a strong urge to move your legs (restless legs syndrome). Stop breathing briefly during sleep (sleep apnea). Think you have a sleep disorder or are taking a medicine that is affecting your quality of sleep. Summary Most adults need 7-8 hours of quality sleep each night. Getting enough quality sleep is an important part of health and well-being. Make your bedroom a place that promotes quality sleep and avoid things that may cause you to have poor sleep, such as alcohol, caffeine, smoking, and large meals. Talk to your health care provider if you have trouble falling asleep or staying asleep. This information is not intended to replace advice given to you by your health care provider. Make sure you discuss any questions you have with your healthcare provider. Document Revised: 02/10/2018 Document Reviewed: 02/10/2018 Elsevier Patient Education  2022 [employer].

## 2021-05-09 ENCOUNTER — Telehealth: Payer: Self-pay | Admitting: Internal Medicine

## 2021-05-09 NOTE — Telephone Encounter (Signed)
Called patient, she states she had already received the results, and had no other questions she must have hit the redial button.  Patient verbalized thanks for calling back.

## 2021-05-09 NOTE — Telephone Encounter (Signed)
Patient stated she was returning a call. 

## 2021-05-28 ENCOUNTER — Telehealth: Payer: Self-pay | Admitting: Internal Medicine

## 2021-05-28 ENCOUNTER — Encounter: Payer: Self-pay | Admitting: Neurology

## 2021-05-28 ENCOUNTER — Ambulatory Visit (INDEPENDENT_AMBULATORY_CARE_PROVIDER_SITE_OTHER): Payer: Medicare Other | Admitting: Neurology

## 2021-05-28 VITALS — BP 150/96 | HR 90 | Ht 62.5 in | Wt 129.8 lb

## 2021-05-28 DIAGNOSIS — I63132 Cerebral infarction due to embolism of left carotid artery: Secondary | ICD-10-CM | POA: Diagnosis not present

## 2021-05-28 DIAGNOSIS — I6522 Occlusion and stenosis of left carotid artery: Secondary | ICD-10-CM

## 2021-05-28 NOTE — Telephone Encounter (Signed)
Called patient no answer.Left message on personal voice mail to call back. 

## 2021-05-28 NOTE — Telephone Encounter (Signed)
Spoke to patient she stated she saw Dr.Sethi this morning.He wanted her to start Losartan.Stated last time she saw Oris Drone NP he did not want her take.Advised I will send message to Edd Fabian NP.

## 2021-05-28 NOTE — Telephone Encounter (Signed)
Pt is returning call.  

## 2021-05-28 NOTE — Progress Notes (Signed)
[facility] [address] [place]. [place] [address] [phone]       OFFICE CONSULT NOTE  Ms. Emmalyn Hinson Date of Birth:  Dec 02, 1950 Medical Record Number:  [id number]   Referring MD: Marvel Plan  Reason for Referral: Stroke  HPI: Ms. Agostini is a 70 year old African-American lady seen today for initial office consultation visit for stroke.  History is obtained from the patient and review of electronic medical records and I personally reviewed available imaging films in PACS. She has past medical history of prediabetes, hyperlipidemia, migraines, asthma who presented on 03/16/2021 with sudden onset of aphasia and mild right facial droop as well as right leg drift and some confusion.  CTA head was unremarkable but CT angiogram revealed terminal left ICA occlusion extending proximally into the left A1 segment of the ACA and the M1 segment of the middle cerebral artery.  She was treated with IV tPA and showed significant improvement and mechanical thrombectomy was not done.  She was kept in the ICU and blood pressure was tightly controlled.  MRI scan showed 3 tiny punctate acute infarcts in the left MCA territory involving deep insular cortex and white matter and corona radiator and left parietal cortex.  Transthoracic echo showed diminished ejection fraction of 30 to 35% which was felt to be new.  TEE showed moderate to severe global reduction in LV systolic function with severe left atrial enlargement but no left atrial thrombus and no PFO however there were delayed bubbles suggestive of intrapulmonary shunt.  Lower extremity venous Dopplers were negative for DVT.  LDL cholesterol is 1 1 8  mg percent and hemoglobin A1c was 5.9.  Urine drug screen was positive for cannabis.  Patient was seen by cardiology who recommended metoprolol and Entresto and anticoagulation with Eliquis for 3 months with repeat echo.  In 3 months.  Patient states she is done extremely well since  discharge.  She has had no residual neurological deficits or facial droop is resolved completely.  She is tolerating Eliquis well with only minor bruising and no bleeding.  She had a 30-day outpatient heart monitor which was negative for atrial fibrillation.  She is returned back to baseline and has no complaints.  She has a follow-up echocardiogram and cardiology appointment planned for August.  Her blood pressure at home is well controlled today it is elevated in office at 150/96.  She is tolerating Lipitor well without muscle aches and pains.  ROS:   14 system review of systems is positive for bruising, facial weakness, all other systems negative    PMH:  Past Medical History:  Diagnosis Date   Allergy    Arthritis    Asthma    hx of   Chicken pox    Heart murmur    Migraines    Stroke [facility])     Social History:  Social History   Socioeconomic History   Marital status: Single    Spouse name: Not on file   Number of children: Not on file   Years of education: Not on file   Highest education level: Not on file  Occupational History   Not on file  Tobacco Use   Smoking status: Never   Smokeless tobacco: Never  Vaping Use   Vaping Use: Never used  Substance and Sexual Activity   Alcohol use: Yes    Comment: occasionally   Drug use: Yes    Frequency: 2.0 times per week    Types: Marijuana   Sexual activity:  Not on file  Other Topics Concern   Not on file  Social History Narrative   Lives alone   Right Handed   Drinks 1 cups caffeine daily   Social Determinants of Health   Financial Resource Strain: Not on file  Food Insecurity: Not on file  Transportation Needs: Not on file  Physical Activity: Not on file  Stress: Not on file  Social Connections: Not on file  Intimate Partner Violence: Not on file    Medications:   Current Outpatient Medications on File Prior to Visit  Medication Sig Dispense Refill   albuterol (VENTOLIN HFA) 108 (90 Base) MCG/ACT inhaler  Inhale 2 puffs into the lungs every 6 (six) hours as needed for wheezing or shortness of breath. 18 g 3   alendronate (FOSAMAX) 70 MG tablet Take 1 tablet (70 mg total) by mouth every 7 (seven) days. Take with a full glass of water on an empty stomach. 12 tablet 3   apixaban (ELIQUIS) 5 MG TABS tablet Take 1 tablet (5 mg total) by mouth 2 (two) times daily. 60 tablet 2   cholecalciferol (VITAMIN D3) 25 MCG (1000 UNIT) tablet Take 1,000 Units by mouth daily.     fluticasone (FLOVENT HFA) 110 MCG/ACT inhaler Inhale 1 puff into the lungs in the morning and at bedtime. 1 Inhaler 3   Misc Natural Products (OSTEO BI-FLEX ADV JOINT SHIELD) TABS Take 1 tablet by mouth daily.     OVER THE COUNTER MEDICATION Take 1 tablet by mouth daily. NEURIVA     Spacer/Aero-Holding Chambers (AEROCHAMBER PLUS) inhaler Use as instructed to use with inahaler. 1 each 1   vitamin C (ASCORBIC ACID) 500 MG tablet Take 500 mg by mouth daily.     vitamin E 200 UNIT capsule Take 200 Units by mouth daily.     atorvastatin (LIPITOR) 20 MG tablet Take 1 tablet (20 mg total) by mouth daily. 30 tablet 3   losartan (COZAAR) 25 MG tablet Take 1 tablet (25 mg total) by mouth daily. (Patient not taking: Reported on 04/08/2021) 30 tablet 3   No current facility-administered medications on file prior to visit.    Allergies:   Allergies  Allergen Reactions   Tetracyclines & Related     Rash on hands    Physical Exam General: Pleasant middle-age lady d, seated, in no evident distress Head: head normocephalic and atraumatic.   Neck: supple with no carotid or supraclavicular bruits Cardiovascular: regular rate and rhythm, no murmurs Musculoskeletal: no deformity Skin:  no rash/petichiae Vascular:  Normal pulses all extremities  Neurologic Exam Mental Status: Awake and fully alert. Oriented to place and time. Recent and remote memory intact. Attention span, concentration and fund of knowledge appropriate. Mood and affect  appropriate.  Cranial Nerves: Fundoscopic exam reveals sharp disc margins. Pupils equal, briskly reactive to light. Extraocular movements full without nystagmus. Visual fields full to confrontation. Hearing intact. Facial sensation intact. Face, tongue, palate moves normally and symmetrically.  Motor: Normal bulk and tone. Normal strength in all tested extremity muscles. Sensory.: intact to touch , pinprick , position and vibratory sensation.  Coordination: Rapid alternating movements normal in all extremities. Finger-to-nose and heel-to-shin performed accurately bilaterally. Gait and Station: Arises from chair without difficulty. Stance is normal. Gait demonstrates normal stride length and balance . Able to heel, toe and tandem walk without difficulty.  Reflexes: 1+ and symmetric. Toes downgoing.   NIHSS  0 Modified Rankin  0   ASSESSMENT: 70 year old lady with left MCA tiny  branch infarct and April 2022 secondary to left carotid occlusion with distal embolization treated with IV tPA with excellent clinical recovery.  Vascular risk factors of carotid occlusion, cardiomyopathy, , hypertension and hyperlipidemia.     PLAN: I had a long d/w patient about his recent stroke, risk for recurrent stroke/TIAs, personally independently reviewed imaging studies and stroke evaluation results and answered questions.Continue Eliquis (apixaban) daily  for secondary stroke prevention pill repeat echocardiogram in August and if ejection fraction has improved changed to aspirin 81 mg daily.  And maintain strict control of hypertension with blood pressure goal below 130/90, diabetes with hemoglobin A1c goal below 6.5% and lipids with LDL cholesterol goal below 70 mg/dL. I also advised the patient to eat a healthy diet with plenty of whole grains, cereals, fruits and vegetables, exercise regularly and maintain ideal body weight .I advised the patient to discuss resuming losartan for blood pressure control and after  discussion with his cardiologist.  Followup in the future with my nurse practitioner Shanda Bumps in 3 months or call earlier if necessary.  Greater than 50% time during this 45-minute consultation visit was spent on counseling and coordination of care and discussion with her about stroke risk prevention and treatment and answering questions  Delia Heady, MD Note: This document was prepared with digital dictation and possible smart phrase technology. Any transcriptional errors that result from this process are unintentional.

## 2021-05-28 NOTE — Telephone Encounter (Signed)
Returned call to patient no answer.Left message to call. 

## 2021-05-28 NOTE — Telephone Encounter (Signed)
Pt c/o medication issue:  1. Name of Medication:     losartan (COZAAR) 25 MG tablet   2. How are you currently taking this medication (dosage and times per day)?  Patient has not been taking this medication  3. Are you having a reaction (difficulty breathing--STAT)? No   4. What is your medication issue?  Patient states she saw her neurologist, Dr. Pearlean Brownie, this morning and he advised that she start taking this medication again. She would like to ensure that Dr. Jacques Navy and Edd Fabian, NP agree with this. Please return call to discuss.

## 2021-05-28 NOTE — Patient Instructions (Signed)
I had a long d/w patient about his recent stroke, risk for recurrent stroke/TIAs, personally independently reviewed imaging studies and stroke evaluation results and answered questions.Continue Eliquis (apixaban) daily  for secondary stroke prevention pill repeat echocardiogram in August and if ejection fraction has improved changed to aspirin 81 mg daily.  And maintain strict control of hypertension with blood pressure goal below 130/90, diabetes with hemoglobin A1c goal below 6.5% and lipids with LDL cholesterol goal below 70 mg/dL. I also advised the patient to eat a healthy diet with plenty of whole grains, cereals, fruits and vegetables, exercise regularly and maintain ideal body weight .I advised the patient to discuss resuming losartan for blood pressure control and after discussion with his cardiologist.  Followup in the future with my nurse practitioner Shanda Bumps in 3 months or call earlier if necessary. Stroke Prevention Some medical conditions and behaviors are associated with a higher chance of having a stroke. You can help prevent a stroke by making nutrition, lifestyle,and other changes, including managing any medical conditions you may have. What nutrition changes can be made?  Eat healthy foods. You can do this by: Choosing foods high in fiber, such as fresh fruits and vegetables and whole grains. Eating at least 5 or more servings of fruits and vegetables a day. Try to fill half of your plate at each meal with fruits and vegetables. Choosing lean protein foods, such as lean cuts of meat, poultry without skin, fish, tofu, beans, and nuts. Eating low-fat dairy products. Avoiding foods that are high in salt (sodium). This can help lower blood pressure. Avoiding foods that have saturated fat, trans fat, and cholesterol. This can help prevent high cholesterol. Avoiding processed and premade foods. Follow your health care provider's specific guidelines for losing weight, controlling high blood  pressure (hypertension), lowering high cholesterol, and managing diabetes. These may include: Reducing your daily calorie intake. Limiting your daily sodium intake to 1,500 milligrams (mg). Using only healthy fats for cooking, such as olive oil, canola oil, or sunflower oil. Counting your daily carbohydrate intake. What lifestyle changes can be made? Maintain a healthy weight. Talk to your health care provider about your ideal weight. Get at least 30 minutes of moderate physical activity at least 5 days a week. Moderate activity includes brisk walking, biking, and swimming. Do not use any products that contain nicotine or tobacco, such as cigarettes and e-cigarettes. If you need help quitting, ask your health care provider. It may also be helpful to avoid exposure to secondhand smoke. Limit alcohol intake to no more than 1 drink a day for nonpregnant women and 2 drinks a day for men. One drink equals 12 oz of beer, 5 oz of wine, or 1 oz of hard liquor. Stop any illegal drug use. Avoid taking birth control pills. Talk to your health care provider about the risks of taking birth control pills if: You are over 95 years old. You smoke. You get migraines. You have ever had a blood clot. What other changes can be made? Manage your cholesterol levels. Eating a healthy diet is important for preventing high cholesterol. If cholesterol cannot be managed through diet alone, you may also need to take medicines. Take any prescribed medicines to control your cholesterol as told by your health care provider. Manage your diabetes. Eating a healthy diet and exercising regularly are important parts of managing your blood sugar. If your blood sugar cannot be managed through diet and exercise, you may need to take medicines. Take any prescribed medicines  to control your diabetes as told by your health care provider. Control your hypertension. To reduce your risk of stroke, try to keep your blood pressure below  130/80. Eating a healthy diet and exercising regularly are an important part of controlling your blood pressure. If your blood pressure cannot be managed through diet and exercise, you may need to take medicines. Take any prescribed medicines to control hypertension as told by your health care provider. Ask your health care provider if you should monitor your blood pressure at home. Have your blood pressure checked every year, even if your blood pressure is normal. Blood pressure increases with age and some medical conditions. Get evaluated for sleep disorders (sleep apnea). Talk to your health care provider about getting a sleep evaluation if you snore a lot or have excessive sleepiness. Take over-the-counter and prescription medicines only as told by your health care provider. Aspirin or blood thinners (antiplatelets or anticoagulants) may be recommended to reduce your risk of forming blood clots that can lead to stroke. Make sure that any other medical conditions you have, such as atrial fibrillation or atherosclerosis, are managed. What are the warning signs of a stroke? The warning signs of a stroke can be easily remembered as BEFAST. B is for balance. Signs include: Dizziness. Loss of balance or coordination. Sudden trouble walking. E is for eyes. Signs include: A sudden change in vision. Trouble seeing. F is for face. Signs include: Sudden weakness or numbness of the face. The face or eyelid drooping to one side. A is for arms. Signs include: Sudden weakness or numbness of the arm, usually on one side of the body. S is for speech. Signs include: Trouble speaking (aphasia). Trouble understanding. T is for time. These symptoms may represent a serious problem that is an emergency. Do not wait to see if the symptoms will go away. Get medical help right away. Call your local emergency services (911 in the [place] Do not drive yourself to the hospital. Other signs of stroke may include: A  sudden, severe headache with no known cause. Nausea or vomiting. Seizure. Where to find more information For more information, visit: American Stroke Association: www.strokeassociation.org National Stroke Association: www.stroke.org Summary You can prevent a stroke by eating healthy, exercising, not smoking, limiting alcohol intake, and managing any medical conditions you may have. Do not use any products that contain nicotine or tobacco, such as cigarettes and e-cigarettes. If you need help quitting, ask your health care provider. It may also be helpful to avoid exposure to secondhand smoke. Remember BEFAST for warning signs of stroke. Get help right away if you or a loved one has any of these signs. This information is not intended to replace advice given to you by your health care provider. Make sure you discuss any questions you have with your healthcare provider. Document Revised: 10/16/2017 Document Reviewed: 12/09/2016 Elsevier Patient Education  2021 [employer].

## 2021-06-06 DIAGNOSIS — Z79899 Other long term (current) drug therapy: Secondary | ICD-10-CM | POA: Diagnosis not present

## 2021-06-06 DIAGNOSIS — I5043 Acute on chronic combined systolic (congestive) and diastolic (congestive) heart failure: Secondary | ICD-10-CM | POA: Diagnosis not present

## 2021-06-06 DIAGNOSIS — E781 Pure hyperglyceridemia: Secondary | ICD-10-CM | POA: Diagnosis not present

## 2021-06-06 DIAGNOSIS — I340 Nonrheumatic mitral (valve) insufficiency: Secondary | ICD-10-CM | POA: Diagnosis not present

## 2021-06-06 DIAGNOSIS — I428 Other cardiomyopathies: Secondary | ICD-10-CM | POA: Diagnosis not present

## 2021-06-07 LAB — LIPID PANEL
Chol/HDL Ratio: 1.9 ratio (ref 0.0–4.4)
Cholesterol, Total: 154 mg/dL (ref 100–199)
HDL: 83 mg/dL (ref >39)
LDL Chol Calc (NIH): 60 mg/dL (ref 0–99)
Triglycerides: 53 mg/dL (ref 0–149)
VLDL Cholesterol Cal: 11 mg/dL (ref 5–40)

## 2021-06-07 LAB — HEPATIC FUNCTION PANEL
ALT: 16 IU/L (ref 0–32)
AST: 20 IU/L (ref 0–40)
Albumin: 4.3 g/dL (ref 3.8–4.8)
Alkaline Phosphatase: 46 IU/L (ref 44–121)
Bilirubin Total: 0.3 mg/dL (ref 0.0–1.2)
Bilirubin, Direct: 0.12 mg/dL (ref 0.00–0.40)
Total Protein: 6.9 g/dL (ref 6.0–8.5)

## 2021-06-10 ENCOUNTER — Ambulatory Visit (INDEPENDENT_AMBULATORY_CARE_PROVIDER_SITE_OTHER): Payer: Medicare Other | Admitting: Family Medicine

## 2021-06-10 ENCOUNTER — Encounter: Payer: Self-pay | Admitting: Family Medicine

## 2021-06-10 ENCOUNTER — Other Ambulatory Visit: Payer: Self-pay

## 2021-06-10 VITALS — BP 130/80 | HR 88 | Temp 97.8°F | Resp 16 | Ht 62.5 in | Wt 130.0 lb

## 2021-06-10 DIAGNOSIS — I10 Essential (primary) hypertension: Secondary | ICD-10-CM | POA: Insufficient documentation

## 2021-06-10 DIAGNOSIS — I5020 Unspecified systolic (congestive) heart failure: Secondary | ICD-10-CM

## 2021-06-10 DIAGNOSIS — I63132 Cerebral infarction due to embolism of left carotid artery: Secondary | ICD-10-CM

## 2021-06-10 DIAGNOSIS — I6310 Cerebral infarction due to embolism of unspecified precerebral artery: Secondary | ICD-10-CM

## 2021-06-10 LAB — BASIC METABOLIC PANEL
BUN: 21 mg/dL (ref 6–23)
CO2: 26 mEq/L (ref 19–32)
Calcium: 10 mg/dL (ref 8.4–10.5)
Chloride: 103 mEq/L (ref 96–112)
Creatinine, Ser: 0.8 mg/dL (ref 0.40–1.20)
GFR: 75.41 mL/min (ref >60.00)
Glucose, Bld: 95 mg/dL (ref 70–99)
Potassium: 4.4 mEq/L (ref 3.5–5.1)
Sodium: 139 mEq/L (ref 135–145)

## 2021-06-10 MED ORDER — APIXABAN 5 MG PO TABS: 5.0000 mg | ORAL_TABLET | Freq: Two times a day (BID) | ORAL | 2 refills | Status: DC

## 2021-06-10 NOTE — Progress Notes (Signed)
HPI: Ms.Anntoinette Sheba Whaling is a 70 y.o. female, who is here today for follow up.   She was last seen on 04/08/21.  HTN: She resumed Losartan on 05/28/21 a few days ago. She is on Losartan 25 mg daily. For the past week she has noted some left sided sharp pain x1, not radiated and when lying in bed. It does not last long.  She has no pain with activity. Negative for severe/frequent headache, visual changes, dyspnea, palpitation, claudication, focal weakness, or edema.  HFrEF: Negative for orthopnea,PND,or edema. Echo 03/18/21: LVEF 30-35%,global hypokinesis.Moderate to severe MR.  BP readings at home: 140's-150's/80's, a few times DBP 90-100. She is tolerating medication well.  Lab Results  Component Value Date   CREATININE 0.69 05/07/2021   BUN 18 05/07/2021   NA 139 05/07/2021   K 4.5 05/07/2021   CL 102 05/07/2021   CO2 24 05/07/2021   Carotid artery stenosis: CVA on 03/16/2021, presented to the ER with sudden aphasia and right facial droop + confusion. Neck CTA showed hemodynamically significant stenoses of BOTH ICA siphons at the distal cavernous/anterior genu level. Treated with tPA. No residual deficit. 30 days heart monitor was negative for atrial fib.  She is on Eliquis 5 mg bid and Atorvastatin 20 mg daily. She is hoping that eliquis can be discontinued eventually. She has echo scheduled for 07/2021. She has not noted major bruising, nose/gum bleeding,melena,blood in stool,or gross hematuria. Tolerating medications well.  Review of Systems  Constitutional:  Negative for activity change, appetite change, fatigue and fever.  HENT:  Negative for mouth sores and sore throat.   Respiratory:  Negative for cough and wheezing.   Gastrointestinal:  Negative for abdominal pain, nausea and vomiting.       Negative for changes in bowel habits.  Genitourinary:  Negative for decreased urine volume and dysuria.  Skin:  Negative for pallor and rash.  Neurological:   Negative for syncope, facial asymmetry and weakness.  Rest of ROS, see pertinent positives sand negatives in HPI  Current Outpatient Medications on File Prior to Visit  Medication Sig Dispense Refill   albuterol (VENTOLIN HFA) 108 (90 Base) MCG/ACT inhaler Inhale 2 puffs into the lungs every 6 (six) hours as needed for wheezing or shortness of breath. 18 g 3   alendronate (FOSAMAX) 70 MG tablet Take 1 tablet (70 mg total) by mouth every 7 (seven) days. Take with a full glass of water on an empty stomach. 12 tablet 3   cholecalciferol (VITAMIN D3) 25 MCG (1000 UNIT) tablet Take 1,000 Units by mouth daily.     fluticasone (FLOVENT HFA) 110 MCG/ACT inhaler Inhale 1 puff into the lungs in the morning and at bedtime. 1 Inhaler 3   losartan (COZAAR) 25 MG tablet Take 1 tablet (25 mg total) by mouth daily. 30 tablet 3   Misc Natural Products (OSTEO BI-FLEX ADV JOINT SHIELD) TABS Take 1 tablet by mouth daily.     OVER THE COUNTER MEDICATION Take 1 tablet by mouth daily. NEURIVA     Spacer/Aero-Holding Chambers (AEROCHAMBER PLUS) inhaler Use as instructed to use with inahaler. 1 each 1   vitamin C (ASCORBIC ACID) 500 MG tablet Take 500 mg by mouth daily.     vitamin E 200 UNIT capsule Take 200 Units by mouth daily.     atorvastatin (LIPITOR) 20 MG tablet Take 1 tablet (20 mg total) by mouth daily. 30 tablet 3   No current facility-administered medications on file prior to visit.  Past Medical History:  Diagnosis Date   Allergy    Arthritis    Asthma    hx of   Chicken pox    Heart murmur    Migraines    Stroke (HCC)    Allergies  Allergen Reactions   Tetracyclines & Related     Rash on hands    Social History   Socioeconomic History   Marital status: Single    Spouse name: Not on file   Number of children: Not on file   Years of education: Not on file   Highest education level: Not on file  Occupational History   Not on file  Tobacco Use   Smoking status: Never   Smokeless  tobacco: Never  Vaping Use   Vaping Use: Never used  Substance and Sexual Activity   Alcohol use: Yes    Comment: occasionally   Drug use: Yes    Frequency: 2.0 times per week    Types: Marijuana   Sexual activity: Not on file  Other Topics Concern   Not on file  Social History Narrative   Lives alone   Right Handed   Drinks 1 cups caffeine daily   Social Determinants of Health   Financial Resource Strain: Not on file  Food Insecurity: Not on file  Transportation Needs: Not on file  Physical Activity: Not on file  Stress: Not on file  Social Connections: Not on file   Vitals:   06/10/21 0955  BP: 130/80  Pulse: 88  Resp: 16  Temp: 97.8 F (36.6 C)  SpO2: 98%   Wt Readings from Last 3 Encounters:  06/10/21 130 lb (59 kg)  05/28/21 129 lb 12.8 oz (58.9 kg)  05/07/21 130 lb 3.2 oz (59.1 kg)   Body mass index is 23.4 kg/m.  Physical Exam Vitals and nursing note reviewed.  Constitutional:      General: She is not in acute distress.    Appearance: She is well-developed and normal weight.  HENT:     Head: Normocephalic and atraumatic.     Mouth/Throat:     Mouth: Mucous membranes are moist.     Pharynx: Oropharynx is clear.  Eyes:     Conjunctiva/sclera: Conjunctivae normal.  Neck:     Vascular: No JVD.  Cardiovascular:     Rate and Rhythm: Normal rate and regular rhythm.     Pulses:          Dorsalis pedis pulses are 2+ on the right side and 2+ on the left side.     Heart sounds: Murmur (Soft SEM LUSB) heard.  Pulmonary:     Effort: Pulmonary effort is normal. No respiratory distress.     Breath sounds: Normal breath sounds.  Abdominal:     Palpations: Abdomen is soft. There is no hepatomegaly or mass.     Tenderness: There is no abdominal tenderness.  Lymphadenopathy:     Cervical: No cervical adenopathy.  Skin:    General: Skin is warm.     Findings: No erythema or rash.  Neurological:     General: No focal deficit present.     Mental Status: She  is alert and oriented to person, place, and time.     Cranial Nerves: No cranial nerve deficit.     Gait: Gait normal.  Psychiatric:     Comments: Well groomed, good eye contact.   ASSESSMENT AND PLAN:  Ms. Eldana Isip was seen today for follow-up.  Orders Placed This Encounter  Procedures   Basic metabolic panel   Lab Results  Component Value Date   CREATININE 0.80 06/10/2021   BUN 21 06/10/2021   NA 139 06/10/2021   K 4.4 06/10/2021   CL 103 06/10/2021   CO2 26 06/10/2021   HFrEF (heart failure with reduced ejection fraction) (HCC) Euvolemic. Initial treatment with ARB abd BB was not well tolerated. She is tolerating Losartan well. Will try to add BB next visit. Pending echo on 08/06/21. Appt with cardiologist 09/17/21.  Essential (primary) hypertension Today BP adequately controlled. Recently Losartan 25 mg started, for now no changes. Continue monitoring BP. Low salt diet. Annual eye exam.  Cerebrovascular accident (CVA) due to embolism of left carotid artery (HCC) Continue Eliquis and Atorvastatin same dose. Cardiac monitor negative for atrial fib. Echo in 03/2021 negative for atrial thrombus. Following with neurologist.  -     apixaban (ELIQUIS) 5 MG TABS tablet; Take 1 tablet (5 mg total) by mouth 2 (two) times daily.  Return in about 2 months (around 08/11/2021).   Damaso Laday G. [place], MD  [facility]. [place] [facility].

## 2021-06-10 NOTE — Patient Instructions (Addendum)
A few things to remember from today's visit:  HFrEF (heart failure with reduced ejection fraction) (HCC) - Plan: Basic metabolic panel  Essential (primary) hypertension - Plan: Basic metabolic panel  Let's continue Losartan 25 mg daily and if blood pressure still elevated in a couple of weeks we can add carvedilol low dose. Keep appt for echo on 08/06/21 and if cardiac function improved we will discontinue Eliquis as recommended by your neurologist.  If you need refills please call your pharmacy. Do not use My Chart to request refills or for acute issues that need immediate attention.   Please be sure medication list is accurate. If a new problem present, please set up appointment sooner than planned today.

## 2021-06-11 NOTE — Telephone Encounter (Signed)
Called patient left message on personal voice mail to call back. 

## 2021-06-11 NOTE — Telephone Encounter (Signed)
Received a  call back from patient she stated she already started taking Losartan 25 mg daily.Stated she had a repeat bmet yesterday at PCP's office.She will keep echo and follow up appointment with Dr.Acharya as planned.

## 2021-06-18 ENCOUNTER — Other Ambulatory Visit: Payer: Self-pay | Admitting: Family Medicine

## 2021-06-18 DIAGNOSIS — Z1231 Encounter for screening mammogram for malignant neoplasm of breast: Secondary | ICD-10-CM

## 2021-06-26 ENCOUNTER — Other Ambulatory Visit: Payer: Self-pay

## 2021-06-26 NOTE — Patient Outreach (Signed)
[facility]) Care Management  06/26/2021  Esmirna Ravan 12-03-1950 [id number]   First telephone outreach attempt to obtain mRS. No answer. Left message for returned call.  Vanice Sarah [facility] Management Assistant [phone]

## 2021-06-28 ENCOUNTER — Other Ambulatory Visit: Payer: Self-pay

## 2021-06-28 NOTE — Patient Outreach (Signed)
[facility]) Care Management  06/28/2021  Paysen Goza Jul 17, 1951 [id number]   Second telephone outreach attempt to obtain mRS. No answer. Left message for returned call.  Vanice Sarah [facility] Management Assistant [phone]

## 2021-06-29 ENCOUNTER — Other Ambulatory Visit: Payer: Self-pay | Admitting: General Practice

## 2021-07-01 ENCOUNTER — Other Ambulatory Visit: Payer: Self-pay

## 2021-07-01 NOTE — Patient Outreach (Signed)
[facility]) Care Management  07/01/2021  Giavonna Pflum 08/17/1951 [id number]   3 outreach attempts were completed to obtain mRs. mRs could not be obtained because patient never returned my calls. mRs=7    Vanice Sarah Care Management Assistant [phone]

## 2021-07-02 ENCOUNTER — Telehealth: Payer: Self-pay | Admitting: Internal Medicine

## 2021-07-02 MED ORDER — LOSARTAN POTASSIUM 25 MG PO TABS: 25.0000 mg | ORAL_TABLET | Freq: Every day | ORAL | 3 refills | Status: DC

## 2021-07-02 NOTE — Telephone Encounter (Signed)
*  STAT* If patient is at the pharmacy, call can be transferred to refill team.   1. Which medications need to be refilled? (please list name of each medication and dose if known) losartan (COZAAR) 25 MG tablet (Expired)  2. Which pharmacy/location (including street and city if local pharmacy) is medication to be sent to? [id number] - [place], [place] - [address]  3. Do they need a 30 day or 90 day supply?  90 day supply

## 2021-07-15 ENCOUNTER — Ambulatory Visit
Admission: RE | Admit: 2021-07-15 | Discharge: 2021-07-15 | Disposition: A | Discharge status: Home / Self Care | Payer: Medicare Other | Source: Ambulatory Visit | Attending: Family Medicine | Admitting: Family Medicine

## 2021-07-15 ENCOUNTER — Other Ambulatory Visit: Payer: Self-pay

## 2021-07-15 DIAGNOSIS — Z1231 Encounter for screening mammogram for malignant neoplasm of breast: Secondary | ICD-10-CM

## 2021-07-19 ENCOUNTER — Other Ambulatory Visit: Payer: Self-pay

## 2021-07-19 ENCOUNTER — Ambulatory Visit (INDEPENDENT_AMBULATORY_CARE_PROVIDER_SITE_OTHER): Payer: Medicare Other

## 2021-07-19 VITALS — BP 132/65 | HR 82 | Temp 98.0°F | Ht 63.0 in | Wt 133.0 lb

## 2021-07-19 DIAGNOSIS — Z0000 Encounter for general adult medical examination without abnormal findings: Secondary | ICD-10-CM

## 2021-07-19 NOTE — Progress Notes (Signed)
Subjective:   Leah Pierce is a 70 y.o. female who presents for Medicare Annual (Subsequent) preventive examination.  Review of Systems    N/a       Objective:    There were no vitals filed for this visit. There is no height or weight on file to calculate BMI.  Advanced Directives 03/27/2021 03/18/2021 03/15/2021 03/15/2021 06/01/2017 05/18/2017 04/20/2017  Does Patient Have a Medical Advance Directive? No No No No No No No  Would patient like information on creating a medical advance directive? Yes (MAU/Ambulatory/Procedural Areas - Information given) No - Patient declined Yes (Inpatient - patient requests chaplain consult to create a medical advance directive) - - - No - Patient declined    Current Medications (verified) Outpatient Encounter Medications as of 07/19/2021  Medication Sig   albuterol (VENTOLIN HFA) 108 (90 Base) MCG/ACT inhaler Inhale 2 puffs into the lungs every 6 (six) hours as needed for wheezing or shortness of breath.   alendronate (FOSAMAX) 70 MG tablet Take 1 tablet (70 mg total) by mouth every 7 (seven) days. Take with a full glass of water on an empty stomach.   apixaban (ELIQUIS) 5 MG TABS tablet Take 1 tablet (5 mg total) by mouth 2 (two) times daily.   atorvastatin (LIPITOR) 20 MG tablet Take 1 tablet (20 mg total) by mouth daily.   cholecalciferol (VITAMIN D3) 25 MCG (1000 UNIT) tablet Take 1,000 Units by mouth daily.   fluticasone (FLOVENT HFA) 110 MCG/ACT inhaler Inhale 1 puff into the lungs in the morning and at bedtime.   losartan (COZAAR) 25 MG tablet Take 1 tablet (25 mg total) by mouth daily.   Misc Natural Products (OSTEO BI-FLEX ADV JOINT SHIELD) TABS Take 1 tablet by mouth daily.   OVER THE COUNTER MEDICATION Take 1 tablet by mouth daily. NEURIVA   Spacer/Aero-Holding Chambers (AEROCHAMBER PLUS) inhaler Use as instructed to use with inahaler.   vitamin C (ASCORBIC ACID) 500 MG tablet Take 500 mg by mouth daily.   vitamin E 200 UNIT capsule Take  200 Units by mouth daily.   No facility-administered encounter medications on file as of 07/19/2021.    Allergies (verified) Tetracyclines & related   History: Past Medical History:  Diagnosis Date   Allergy    Arthritis    Asthma    hx of   Chicken pox    Heart murmur    Migraines    Stroke [facility])    Past Surgical History:  Procedure Laterality Date   BUBBLE STUDY  03/18/2021   Procedure: BUBBLE STUDY;  Surgeon: Lewayne Bunting, MD;  Location: [facility] ENDOSCOPY;  Service: Cardiovascular;;   CESAREAN SECTION     COLONOSCOPY     TEE WITHOUT CARDIOVERSION N/A 03/18/2021   Procedure: TRANSESOPHAGEAL ECHOCARDIOGRAM (TEE);  Surgeon: Lewayne Bunting, MD;  Location: [facility] ENDOSCOPY;  Service: Cardiovascular;  Laterality: N/A;   TONSILLECTOMY  1959   Family History  Problem Relation Age of Onset   Arthritis Mother    Stroke Mother    Hypertension Mother    Diabetes Mother    Colon polyps Mother    Arthritis Father    Cancer Father        Prostate   Cancer Brother        prostate   Cancer Brother        Pancreas   Colon cancer Neg Hx    Esophageal cancer Neg Hx    Rectal cancer Neg Hx    Stomach cancer  Neg Hx    Social History   Socioeconomic History   Marital status: Single    Spouse name: Not on file   Number of children: Not on file   Years of education: Not on file   Highest education level: Not on file  Occupational History   Not on file  Tobacco Use   Smoking status: Never   Smokeless tobacco: Never  Vaping Use   Vaping Use: Never used  Substance and Sexual Activity   Alcohol use: Yes    Comment: occasionally   Drug use: Yes    Frequency: 2.0 times per week    Types: Marijuana   Sexual activity: Not on file  Other Topics Concern   Not on file  Social History Narrative   Lives alone   Right Handed   Drinks 1 cups caffeine daily   Social Determinants of Health   Financial Resource Strain: Not on file  Food Insecurity: Not on file  Transportation  Needs: Not on file  Physical Activity: Not on file  Stress: Not on file  Social Connections: Not on file    Tobacco Counseling Counseling given: Not Answered   Clinical Intake:                 Diabetic?no         Activities of Daily Living In your present state of health, do you have any difficulty performing the following activities: 03/15/2021 08/08/2020  Hearing? N N  Vision? N N  Difficulty concentrating or making decisions? N N  Walking or climbing stairs? N N  Dressing or bathing? N N  Doing errands, shopping? Y N  Some recent data might be hidden    Patient Care Team: [place], Betty G, MD as PCP - General (Family Medicine) Parke Poisson, MD as PCP - Cardiology (Cardiology)  Indicate any recent Medical Services you may have received from other than [facility] providers in the past year (date may be approximate).     Assessment:   This is a routine wellness examination for Leah Pierce.  Hearing/Vision screen No results found.  Dietary issues and exercise activities discussed:     Goals Addressed   None    Depression Screen PHQ 2/9 Scores 04/08/2021 08/08/2020 08/05/2019 04/20/2018  PHQ - 2 Score 0 0 0 0    Fall Risk Fall Risk  04/08/2021 08/08/2020 02/03/2020 08/05/2019 04/20/2018  Falls in the past year? 0 1 1 1  Yes  Number falls in past yr: 0 0 0 0 -  Comment - - - Tripped with scale 7 months ago, home. -  Injury with Fall? - 1 1 0 No  Comment - - Right wrist fx - -  Risk for fall due to : No Fall Risks History of fall(s) - - Other (Comment)  Follow up Falls evaluation completed Education provided Education provided Education provided -    FALL RISK PREVENTION PERTAINING TO THE HOME:  Any stairs in or around the home? Yes  If so, are there any without handrails? No  Home free of loose throw rugs in walkways, pet beds, electrical cords, etc? Yes  Adequate lighting in your home to reduce risk of falls? Yes   ASSISTIVE DEVICES UTILIZED TO PREVENT  FALLS:  Life alert? No  Use of a cane, walker or w/c? No  Grab bars in the bathroom? Yes  Shower chair or bench in shower? No  Elevated toilet seat or a handicapped toilet? No    Cognitive Function:  Normal cognitive status assessed by direct observation by this Nurse Health Advisor. No abnormalities found.      Immunizations Immunization History  Administered Date(s) Administered   PFIZER(Purple Top)SARS-COV-2 Vaccination 12/08/2019, 12/29/2019, 09/01/2020   Pneumococcal Conjugate-13 04/21/2018   Pneumococcal Polysaccharide-23 02/03/2020    TDAP status: Due, Education has been provided regarding the importance of this vaccine. Advised may receive this vaccine at local pharmacy or Health Dept. Aware to provide a copy of the vaccination record if obtained from local pharmacy or Health Dept. Verbalized acceptance and understanding.  Flu Vaccine status: Up to date  Pneumococcal vaccine status: Up to date  Covid-19 vaccine status: Completed vaccines  Qualifies for Shingles Vaccine? Yes   Zostavax completed No   Shingrix Completed?: No.    Education has been provided regarding the importance of this vaccine. Patient has been advised to call insurance company to determine out of pocket expense if they have not yet received this vaccine. Advised may also receive vaccine at local pharmacy or Health Dept. Verbalized acceptance and understanding.  Screening Tests Health Maintenance  Topic Date Due   COVID-19 Vaccine (4 - Booster for Pfizer series) 12/02/2020   MAMMOGRAM  07/04/2022   COLONOSCOPY (Pts 45-80yrs Insurance coverage will need to be confirmed)  06/02/2027   DEXA SCAN  Completed   Hepatitis C Screening  Completed   PNA vac Low Risk Adult  Completed   HPV VACCINES  Aged Out   INFLUENZA VACCINE  Discontinued   TETANUS/TDAP  Discontinued   Zoster Vaccines- Shingrix  Discontinued    Health Maintenance  Health Maintenance Due  Topic Date Due   COVID-19 Vaccine (4 -  Booster for Pfizer series) 12/02/2020    Colorectal cancer screening: Type of screening: Colonoscopy. Completed 06/01/2017. Repeat every 10 years  Mammogram status: Completed 07/15/2021. Repeat every year  Bone Density status: Completed 06/07/2018. Results reflect: Bone density results: OSTEOPENIA. Repeat every 5 years.  Lung Cancer Screening: (Low Dose CT Chest recommended if Age 43-80 years, 30 pack-year currently smoking OR have quit w/in 15years.) does not qualify.   Lung Cancer Screening Referral: n/a  Additional Screening:  Hepatitis C Screening: does not qualify; Completed 04/21/2018  Vision Screening: Recommended annual ophthalmology exams for early detection of glaucoma and other disorders of the eye. Is the patient up to date with their annual eye exam?  Yes  Who is the provider or what is the name of the office in which the patient attends annual eye exams? Dr.Koop If pt is not established with a provider, would they like to be referred to a provider to establish care? No .   Dental Screening: Recommended annual dental exams for proper oral hygiene  Community Resource Referral / Chronic Care Management: CRR required this visit?  No   CCM required this visit?  No      Plan:     I have personally reviewed and noted the following in the patient's chart:   Medical and social history Use of alcohol, tobacco or illicit drugs  Current medications and supplements including opioid prescriptions.  Functional ability and status Nutritional status Physical activity Advanced directives List of other physicians Hospitalizations, surgeries, and ER visits in previous 12 months Vitals Screenings to include cognitive, depression, and falls Referrals and appointments  In addition, I have reviewed and discussed with patient certain preventive protocols, quality metrics, and best practice recommendations. A written personalized care plan for preventive services as well as  general preventive health recommendations were provided to patient.  March Rummage, LPN   07/25/3381   Nurse Notes: none

## 2021-07-19 NOTE — Patient Instructions (Signed)
Leah Pierce , Thank you for taking time to come for your Medicare Wellness Visit. I appreciate your ongoing commitment to your health goals. Please review the following plan we discussed and let me know if I can assist you in the future.   Screening recommendations/referrals: Colonoscopy: 06/01/2017  due 2028  Mammogram: 07/15/2021 Bone Density: 06/07/2018 Recommended yearly ophthalmology/optometry visit for glaucoma screening and checkup Recommended yearly dental visit for hygiene and checkup  Vaccinations: Influenza vaccine: due in fall 2022  Pneumococcal vaccine: completed series  Tdap vaccine: due upon injury  Shingles vaccine: will consider     Advanced directives: none   Conditions/risks identified: none   Next appointment: OV 08/12/2021  1030 Dr.Jordan    Preventive Care 65 Years and Older, Female Preventive care refers to lifestyle choices and visits with your health care provider that can promote health and wellness. What does preventive care include? A yearly physical exam. This is also called an annual well check. Dental exams once or twice a year. Routine eye exams. Ask your health care provider how often you should have your eyes checked. Personal lifestyle choices, including: Daily care of your teeth and gums. Regular physical activity. Eating a healthy diet. Avoiding tobacco and drug use. Limiting alcohol use. Practicing safe sex. Taking low-dose aspirin every day. Taking vitamin and mineral supplements as recommended by your health care provider. What happens during an annual well check? The services and screenings done by your health care provider during your annual well check will depend on your age, overall health, lifestyle risk factors, and family history of disease. Counseling  Your health care provider may ask you questions about your: Alcohol use. Tobacco use. Drug use. Emotional well-being. Home and relationship well-being. Sexual  activity. Eating habits. History of falls. Memory and ability to understand (cognition). Work and work Astronomer. Reproductive health. Screening  You may have the following tests or measurements: Height, weight, and BMI. Blood pressure. Lipid and cholesterol levels. These may be checked every 5 years, or more frequently if you are over 68 years old. Skin check. Lung cancer screening. You may have this screening every year starting at age 53 if you have a 30-pack-year history of smoking and currently smoke or have quit within the past 15 years. Fecal occult blood test (FOBT) of the stool. You may have this test every year starting at age 74. Flexible sigmoidoscopy or colonoscopy. You may have a sigmoidoscopy every 5 years or a colonoscopy every 10 years starting at age 37. Hepatitis C blood test. Hepatitis B blood test. Sexually transmitted disease (STD) testing. Diabetes screening. This is done by checking your blood sugar (glucose) after you have not eaten for a while (fasting). You may have this done every 1-3 years. Bone density scan. This is done to screen for osteoporosis. You may have this done starting at age 45. Mammogram. This may be done every 1-2 years. Talk to your health care provider about how often you should have regular mammograms. Talk with your health care provider about your test results, treatment options, and if necessary, the need for more tests. Vaccines  Your health care provider may recommend certain vaccines, such as: Influenza vaccine. This is recommended every year. Tetanus, diphtheria, and acellular pertussis (Tdap, Td) vaccine. You may need a Td booster every 10 years. Zoster vaccine. You may need this after age 11. Pneumococcal 13-valent conjugate (PCV13) vaccine. One dose is recommended after age 58. Pneumococcal polysaccharide (PPSV23) vaccine. One dose is recommended after age 60. Talk  to your health care provider about which screenings and vaccines  you need and how often you need them. This information is not intended to replace advice given to you by your health care provider. Make sure you discuss any questions you have with your health care provider. Document Released: 11/30/2015 Document Revised: 07/23/2016 Document Reviewed: 09/04/2015 Elsevier Interactive Patient Education  2017  Prevention in the Home Falls can cause injuries. They can happen to people of all ages. There are many things you can do to make your home safe and to help prevent falls. What can I do on the outside of my home? Regularly fix the edges of walkways and driveways and fix any cracks. Remove anything that might make you trip as you walk through a door, such as a raised step or threshold. Trim any bushes or trees on the path to your home. Use bright outdoor lighting. Clear any walking paths of anything that might make someone trip, such as rocks or tools. Regularly check to see if handrails are loose or broken. Make sure that both sides of any steps have handrails. Any raised decks and porches should have guardrails on the edges. Have any leaves, snow, or ice cleared regularly. Use sand or salt on walking paths during winter. Clean up any spills in your garage right away. This includes oil or grease spills. What can I do in the bathroom? Use night lights. Install grab bars by the toilet and in the tub and shower. Do not use towel bars as grab bars. Use non-skid mats or decals in the tub or shower. If you need to sit down in the shower, use a plastic, non-slip stool. Keep the floor dry. Clean up any water that spills on the floor as soon as it happens. Remove soap buildup in the tub or shower regularly. Attach bath mats securely with double-sided non-slip rug tape. Do not have throw rugs and other things on the floor that can make you trip. What can I do in the bedroom? Use night lights. Make sure that you have a light by your bed that  is easy to reach. Do not use any sheets or blankets that are too big for your bed. They should not hang down onto the floor. Have a firm chair that has side arms. You can use this for support while you get dressed. Do not have throw rugs and other things on the floor that can make you trip. What can I do in the kitchen? Clean up any spills right away. Avoid walking on wet floors. Keep items that you use a lot in easy-to-reach places. If you need to reach something above you, use a strong step stool that has a grab bar. Keep electrical cords out of the way. Do not use floor polish or wax that makes floors slippery. If you must use wax, use non-skid floor wax. Do not have throw rugs and other things on the floor that can make you trip. What can I do with my stairs? Do not leave any items on the stairs. Make sure that there are handrails on both sides of the stairs and use them. Fix handrails that are broken or loose. Make sure that handrails are as long as the stairways. Check any carpeting to make sure that it is firmly attached to the stairs. Fix any carpet that is loose or worn. Avoid having throw rugs at the top or bottom of the stairs. If you do have throw rugs,  attach them to the floor with carpet tape. Make sure that you have a light switch at the top of the stairs and the bottom of the stairs. If you do not have them, ask someone to add them for you. What else can I do to help prevent falls? Wear shoes that: Do not have high heels. Have rubber bottoms. Are comfortable and fit you well. Are closed at the toe. Do not wear sandals. If you use a stepladder: Make sure that it is fully opened. Do not climb a closed stepladder. Make sure that both sides of the stepladder are locked into place. Ask someone to hold it for you, if possible. Clearly mark and make sure that you can see: Any grab bars or handrails. First and last steps. Where the edge of each step is. Use tools that help you  move around (mobility aids) if they are needed. These include: Canes. Walkers. Scooters. Crutches. Turn on the lights when you go into a dark area. Replace any light bulbs as soon as they burn out. Set up your furniture so you have a clear path. Avoid moving your furniture around. If any of your floors are uneven, fix them. If there are any pets around you, be aware of where they are. Review your medicines with your doctor. Some medicines can make you feel dizzy. This can increase your chance of falling. Ask your doctor what other things that you can do to help prevent falls. This information is not intended to replace advice given to you by your health care provider. Make sure you discuss any questions you have with your health care provider. Document Released: 08/30/2009 Document Revised: 04/10/2016 Document Reviewed: 12/08/2014 Elsevier Interactive Patient Education  2017 [employer].

## 2021-08-06 ENCOUNTER — Ambulatory Visit (HOSPITAL_COMMUNITY): Payer: Medicare Other | Attending: General Practice

## 2021-08-06 ENCOUNTER — Other Ambulatory Visit: Payer: Self-pay

## 2021-08-06 DIAGNOSIS — I340 Nonrheumatic mitral (valve) insufficiency: Secondary | ICD-10-CM | POA: Diagnosis not present

## 2021-08-06 DIAGNOSIS — I428 Other cardiomyopathies: Secondary | ICD-10-CM | POA: Insufficient documentation

## 2021-08-06 DIAGNOSIS — I5043 Acute on chronic combined systolic (congestive) and diastolic (congestive) heart failure: Secondary | ICD-10-CM | POA: Diagnosis not present

## 2021-08-06 LAB — ECHOCARDIOGRAM COMPLETE
MV M vel: 5.33 m/s
MV Peak grad: 113.6 mmHg
Radius: 0.7 cm
S' Lateral: 3.4 cm

## 2021-08-12 ENCOUNTER — Encounter: Payer: Self-pay | Admitting: Family Medicine

## 2021-08-12 ENCOUNTER — Telehealth (INDEPENDENT_AMBULATORY_CARE_PROVIDER_SITE_OTHER): Payer: Medicare Other | Admitting: Family Medicine

## 2021-08-12 VITALS — BP 119/80 | HR 84 | Ht 63.0 in

## 2021-08-12 DIAGNOSIS — I10 Essential (primary) hypertension: Secondary | ICD-10-CM

## 2021-08-12 DIAGNOSIS — I6523 Occlusion and stenosis of bilateral carotid arteries: Secondary | ICD-10-CM

## 2021-08-12 DIAGNOSIS — I5020 Unspecified systolic (congestive) heart failure: Secondary | ICD-10-CM | POA: Diagnosis not present

## 2021-08-12 DIAGNOSIS — I6529 Occlusion and stenosis of unspecified carotid artery: Secondary | ICD-10-CM | POA: Insufficient documentation

## 2021-08-12 MED ORDER — LOSARTAN POTASSIUM 25 MG PO TABS: 25.0000 mg | ORAL_TABLET | Freq: Every day | ORAL | 2 refills | Status: DC

## 2021-08-12 NOTE — Progress Notes (Signed)
Virtual Visit via Video Note I connected with Leah Pierce on 08/12/21 by a video enabled telemedicine application and verified that I am speaking with the correct person using two identifiers.  Location patient: home Location provider:Home office Persons participating in the virtual visit: patient, provider  I discussed the limitations of evaluation and management by telemedicine and the availability of in person appointments. The patient expressed understanding and agreed to proceed.  Chief Complaint  Patient presents with   Follow-up   HPI: [name] is a 70 yo female with hx of HFrEF,HTN, moderate to severe MR, and HLD being seen today for follow up. She was last seen on 06/10/21. Since her last visit she had echo: LVEF 35-40%, mildly improved from 30-35% in 03/2021. The left ventricle demonstrates global hypokinesis. Moderate to severe mitral valve regurgitation. No evidence of mitral stenosis.  She is on Losartan 25 mg daily.  She has tolerated medication well.  BP's 110's-130's/80's. Negative for severe/frequent headache, visual changes, chest pain, dyspnea, palpitation, claudication, focal weakness, or edema. Denies orthopnea and PND.  Lab Results  Component Value Date   CREATININE 0.80 06/10/2021   BUN 21 06/10/2021   NA 139 06/10/2021   K 4.4 06/10/2021   CL 103 06/10/2021   CO2 26 06/10/2021   CVA with no residual deficit. She follows with neurologist. + Bilateral carotid artery stenosis. She is on Eliquis 5 mg bid and Atorvastatin 20 mg daily. Cardiac monitor negative for atrial fib. Lab Results  Component Value Date   CHOL 154 06/06/2021   HDL 83 06/06/2021   LDLCALC 60 06/06/2021   TRIG 53 06/06/2021   CHOLHDL 1.9 06/06/2021   ROS: See pertinent positives and negatives per HPI.  Past Medical History:  Diagnosis Date   Allergy    Arthritis    Asthma    hx of   Chicken pox    Heart murmur    Migraines    Stroke [facility])     Past Surgical  History:  Procedure Laterality Date   BUBBLE STUDY  03/18/2021   Procedure: BUBBLE STUDY;  Surgeon: Lewayne Bunting, MD;  Location: [facility] ENDOSCOPY;  Service: Cardiovascular;;   CESAREAN SECTION     COLONOSCOPY     TEE WITHOUT CARDIOVERSION N/A 03/18/2021   Procedure: TRANSESOPHAGEAL ECHOCARDIOGRAM (TEE);  Surgeon: Lewayne Bunting, MD;  Location: [facility] ENDOSCOPY;  Service: Cardiovascular;  Laterality: N/A;   TONSILLECTOMY  1959    Family History  Problem Relation Age of Onset   Arthritis Mother    Stroke Mother    Hypertension Mother    Diabetes Mother    Colon polyps Mother    Arthritis Father    Cancer Father        Prostate   Cancer Brother        prostate   Cancer Brother        Pancreas   Colon cancer Neg Hx    Esophageal cancer Neg Hx    Rectal cancer Neg Hx    Stomach cancer Neg Hx     Social History   Socioeconomic History   Marital status: Single    Spouse name: Not on file   Number of children: Not on file   Years of education: Not on file   Highest education level: Not on file  Occupational History   Not on file  Tobacco Use   Smoking status: Never   Smokeless tobacco: Never  Vaping Use   Vaping Use: Never used  Substance and Sexual Activity   Alcohol use: Yes    Comment: occasionally   Drug use: Yes    Frequency: 2.0 times per week    Types: Marijuana   Sexual activity: Not on file  Other Topics Concern   Not on file  Social History Narrative   Lives alone   Right Handed   Drinks 1 cups caffeine daily   Social Determinants of Health   Financial Resource Strain: Low Risk    Difficulty of Paying Living Expenses: Not hard at all  Food Insecurity: No Food Insecurity   Worried About Programme researcher, broadcasting/film/video in the Last Year: Never true   Ran Out of Food in the Last Year: Never true  Transportation Needs: No Transportation Needs   Lack of Transportation (Medical): No   Lack of Transportation (Non-Medical): No  Physical Activity: Sufficiently Active    Days of Exercise per Week: 4 days   Minutes of Exercise per Session: 40 min  Stress: No Stress Concern Present   Feeling of Stress : Not at all  Social Connections: Socially Isolated   Frequency of Communication with Friends and Family: More than three times a week   Frequency of Social Gatherings with Friends and Family: More than three times a week   Attends Religious Services: Never   Database administrator or Organizations: No   Attends Engineer, structural: Never   Marital Status: Divorced  Catering manager Violence: Not At Risk   Fear of Current or Ex-Partner: No   Emotionally Abused: No   Physically Abused: No   Sexually Abused: No    Current Outpatient Medications:    albuterol (VENTOLIN HFA) 108 (90 Base) MCG/ACT inhaler, Inhale 2 puffs into the lungs every 6 (six) hours as needed for wheezing or shortness of breath., Disp: 18 g, Rfl: 3   alendronate (FOSAMAX) 70 MG tablet, Take 1 tablet (70 mg total) by mouth every 7 (seven) days. Take with a full glass of water on an empty stomach., Disp: 12 tablet, Rfl: 3   apixaban (ELIQUIS) 5 MG TABS tablet, Take 1 tablet (5 mg total) by mouth 2 (two) times daily., Disp: 60 tablet, Rfl: 2   cholecalciferol (VITAMIN D3) 25 MCG (1000 UNIT) tablet, Take 1,000 Units by mouth daily., Disp: , Rfl:    fluticasone (FLOVENT HFA) 110 MCG/ACT inhaler, Inhale 1 puff into the lungs in the morning and at bedtime., Disp: 1 Inhaler, Rfl: 3   Misc Natural Products (OSTEO BI-FLEX ADV JOINT SHIELD) TABS, Take 1 tablet by mouth daily., Disp: , Rfl:    OVER THE COUNTER MEDICATION, Take 1 tablet by mouth daily. NEURIVA, Disp: , Rfl:    Spacer/Aero-Holding Chambers (AEROCHAMBER PLUS) inhaler, Use as instructed to use with inahaler., Disp: 1 each, Rfl: 1   vitamin C (ASCORBIC ACID) 500 MG tablet, Take 500 mg by mouth daily., Disp: , Rfl:    vitamin E 200 UNIT capsule, Take 200 Units by mouth daily., Disp: , Rfl:    atorvastatin (LIPITOR) 20 MG tablet,  Take 1 tablet (20 mg total) by mouth daily., Disp: 30 tablet, Rfl: 3   losartan (COZAAR) 25 MG tablet, Take 1 tablet (25 mg total) by mouth daily., Disp: 90 tablet, Rfl: 2  EXAM:  VITALS per patient if applicable:BP 119/80   Pulse 84   Ht 5\' 3"  (1.6 m)   BMI 23.56 kg/m   GENERAL: alert, oriented, appears well and in no acute distress  HEENT: atraumatic, conjunctiva clear,  no obvious abnormalities on inspection.  NECK: normal movements of the head and neck  LUNGS: on inspection no signs of respiratory distress, breathing rate appears normal, no obvious gross SOB, gasping or wheezing  CV: no obvious cyanosis  MS: moves all visible extremities without noticeable abnormality  PSYCH/NEURO: pleasant and cooperative, no obvious depression or anxiety, speech and thought processing grossly intact  ASSESSMENT AND PLAN:  Discussed the following assessment and plan:  HFrEF (heart failure with reduced ejection fraction) (HCC) LVEF mildly improved. She seems to be euvolemic. Continue Losartan 25 mg daily and low salt diet.  She is not on BB or SGLT2 inhibitor. BP's lower normal range at home sometimes, so BB could cause hypotension. I will ask Dr Jacques Navy about Jardiance 10 mg. Has appt with cardiologist in 09/2021.  Bilateral carotid artery stenosis S/P CVA. Continue Atorvastatin 20 mg daily and Eliquis 5 mg bid. Has appt with neurologist in 08/2021.  Essential (primary) hypertension BP adequately controlled. Continue current management: Losartan 25 mg daily. DASH/low salt diet to continue. Continue monitoring BP at home.  I discussed the assessment and treatment plan with the patient. The patient was provided an opportunity to ask questions and all were answered. The patient agreed with the plan and demonstrated an understanding of the instructions.  Return in about 6 months (around 02/09/2022) for cpe.  Joscelyne Renville [place], MD

## 2021-08-13 ENCOUNTER — Other Ambulatory Visit: Payer: Self-pay | Admitting: Family Medicine

## 2021-08-13 DIAGNOSIS — I5020 Unspecified systolic (congestive) heart failure: Secondary | ICD-10-CM

## 2021-08-13 MED ORDER — EMPAGLIFLOZIN 10 MG PO TABS: 10.0000 mg | ORAL_TABLET | Freq: Every day | ORAL | 3 refills | Status: DC

## 2021-08-15 ENCOUNTER — Telehealth: Payer: Self-pay

## 2021-08-15 NOTE — Telephone Encounter (Signed)
Returned call to patient, made patient an appointment with Dr. Jacques Navy on 08/27/21 at 1:00PM. Patient aware of appointment time and verbalized understanding.   Advised patient to call back to office with any issues, questions, or concerns. Patient verbalized understanding.

## 2021-08-15 NOTE — Telephone Encounter (Signed)
Attempted to call patient at the request of Dr. Jacques Navy to make her a sooner appointment. Left message for patient to call back.

## 2021-08-15 NOTE — Telephone Encounter (Signed)
Patient returning call.

## 2021-08-27 ENCOUNTER — Encounter: Payer: Self-pay | Admitting: Internal Medicine

## 2021-08-27 ENCOUNTER — Other Ambulatory Visit: Payer: Self-pay

## 2021-08-27 ENCOUNTER — Ambulatory Visit (INDEPENDENT_AMBULATORY_CARE_PROVIDER_SITE_OTHER): Payer: Medicare Other | Admitting: Internal Medicine

## 2021-08-27 VITALS — BP 122/70 | HR 61 | Ht 63.0 in | Wt 134.6 lb

## 2021-08-27 DIAGNOSIS — E785 Hyperlipidemia, unspecified: Secondary | ICD-10-CM

## 2021-08-27 DIAGNOSIS — Z79899 Other long term (current) drug therapy: Secondary | ICD-10-CM

## 2021-08-27 DIAGNOSIS — I340 Nonrheumatic mitral (valve) insufficiency: Secondary | ICD-10-CM | POA: Diagnosis not present

## 2021-08-27 DIAGNOSIS — I5043 Acute on chronic combined systolic (congestive) and diastolic (congestive) heart failure: Secondary | ICD-10-CM | POA: Diagnosis not present

## 2021-08-27 DIAGNOSIS — I10 Essential (primary) hypertension: Secondary | ICD-10-CM

## 2021-08-27 DIAGNOSIS — I63132 Cerebral infarction due to embolism of left carotid artery: Secondary | ICD-10-CM

## 2021-08-27 DIAGNOSIS — I5020 Unspecified systolic (congestive) heart failure: Secondary | ICD-10-CM | POA: Diagnosis not present

## 2021-08-27 DIAGNOSIS — I6523 Occlusion and stenosis of bilateral carotid arteries: Secondary | ICD-10-CM

## 2021-08-27 MED ORDER — METOPROLOL SUCCINATE ER 25 MG PO TB24: 12.5000 mg | ORAL_TABLET | Freq: Every day | ORAL | 3 refills | Status: DC

## 2021-08-27 MED ORDER — ATORVASTATIN CALCIUM 20 MG PO TABS: 20.0000 mg | ORAL_TABLET | Freq: Every day | ORAL | 3 refills | Status: DC

## 2021-08-27 NOTE — Patient Instructions (Addendum)
Medication Instructions:  START: METOPROLOL SUCCINATE 12.5mg  ONCE DAILY  *If you need a refill on your cardiac medications before your next appointment, please call your pharmacy*  Lab Work: BMET- ONE WEEK PRIOR TO CCTA SCAN  If you have labs (blood work) drawn today and your tests are completely normal, you will receive your results only by: MyChart Message (if you have MyChart) OR A paper copy in the mail If you have any lab test that is abnormal or we need to change your treatment, we will call you to review the results.  Testing/Procedures: Your physician has requested that you have cardiac CT. Cardiac computed tomography (CT) is a painless test that uses an x-ray machine to take clear, detailed pictures of your heart. For further information please visit [email or web address] Please follow instruction sheet as given.  Follow-Up: At [facility], you and your health needs are our priority.  As part of our continuing mission to provide you with exceptional heart care, we have created designated Provider Care Teams.  These Care Teams include your primary Cardiologist (physician) and Advanced Practice Providers (APPs -  Physician Assistants and Nurse Practitioners) who all work together to provide you with the care you need, when you need it.  We recommend signing up for the patient portal called "MyChart".  Sign up information is provided on this After Visit Summary.  MyChart is used to connect with patients for Virtual Visits (Telemedicine).  Patients are able to view lab/test results, encounter notes, upcoming appointments, etc.  Non-urgent messages can be sent to your provider as well.   To learn more about what you can do with MyChart, go to [email or web address].    Your next appointment:   AS SCHEDULED   The format for your next appointment:   In Person  Provider:   Weston Brass, MD  Other Instructions   Your cardiac CT will be scheduled at one of the below locations:    [facility] [address] [place], [place] [address] [phone]  If scheduled at [facility], please arrive at the [facility] main entrance (entrance A) of [facility] 30 minutes prior to test start time. Proceed to the [facility] Radiology Department (first floor) to check-in and test prep.  Please follow these instructions carefully (unless otherwise directed):  On the Night Before the Test: Be sure to Drink plenty of water. Do not consume any caffeinated/decaffeinated beverages or chocolate 12 hours prior to your test. Do not take any antihistamines 12 hours prior to your test.  On the Day of the Test: Drink plenty of water until 1 hour prior to the test. Do not eat any food 4 hours prior to the test. You may take your regular medications prior to the test.  HOLD Furosemide/Hydrochlorothiazide morning of the test. FEMALES- please wear underwire-free bra if available, avoid dresses & tight clothing      After the Test: Drink plenty of water. After receiving IV contrast, you may experience a mild flushed feeling. This is normal. On occasion, you may experience a mild rash up to 24 hours after the test. This is not dangerous. If this occurs, you can take Benadryl 25 mg and increase your fluid intake. If you experience trouble breathing, this can be serious. If it is severe call 911 IMMEDIATELY. If it is mild, please call our office. If you take any of these medications: Glipizide/Metformin, Avandament, Glucavance, please do not take 48 hours after completing test unless otherwise instructed.  Please  allow 2-4 weeks for scheduling of routine cardiac CTs. Some insurance companies require a pre-authorization which may delay scheduling of this test.   For non-scheduling related questions, please contact the cardiac imaging nurse navigator should you have any questions/concerns: Rockwell Alexandria, Cardiac Imaging Nurse Navigator Larey Brick, Cardiac  Imaging Nurse Navigator [facility] Direct Office Dial: [phone]   For scheduling needs, including cancellations and rescheduling, please call [place], [phone].

## 2021-08-27 NOTE — Progress Notes (Signed)
Cardiology Office Note:    Date:  08/27/2021   ID:  Leah Pierce, DOB May 18, 1951, MRN [id number]  PCP:  [place], Betty G, MD  Cardiologist:  Parke Poisson, MD  Electrophysiologist:  None   Referring MD: [place], Betty G, MD   Chief Complaint/Reason for Referral: MV disease, stroke, HFrEF  History of Present Illness:    Leah Pierce is a 70 y.o. female with a history of acute systolic HF, mitral valve regurgitation, CVA, HLD, prediabetes, asthma. Presents for follow up.   Had a viral infection in the first part of April 2022, preceding stroke and HF diagnosis. Unclear if CM represents viral etiology.Still has not had ischemic workup. We will arrange this now. CCTA may be good option, will need HR control.  Reviewed TTE from 08/06/21 and TEE from 03/18/21. MR is at least moderate severe. Mechanism appears mixed with some primary leaflet dysfunction of the anterior leaflet, and secondary MR with leaflet restriction, quite notable with the secondary chords attaching to the anterior leaflet. EF appears 40% on most recent TTE.   Heart Failure Therapy ACE-I/ARB/ARNI: losartan 25 mg daily, recently resumed. BB: none yet MRA: none SGLT2I: jardiance 10 mg daily started by Dr. B. [place] - feels stronger on this medication. Diuretic plan: euvolemic and has not required diuresis recently.  Symptomatically, she has had no significant shortness of breath or chest pain.  She does occasionally have paroxysms of shortness of breath for which she uses her rescue inhaler prescribed by Dr. [place].  She does have a diagnosis of asthma since childhood, but she is not having to frequently use her inhaler.  She is NYHA class I symptoms for the most part, occasionally class II.   Family history of heart disease.  It does sound like her brother had a cardiomyopathy for which she saw Dr. Gala Romney.  She notes a strong family history of heart disease and wonders if some of her heart trouble could  be genetic.  We discussed in great detail the natural history of cardiomyopathy and the work-up involved, including initially ruling out ischemic heart disease and subsequently pursuing a valvular, postviral, or genetic cardiomyopathic process.   Past Medical History:  Diagnosis Date   Allergy    Arthritis    Asthma    hx of   Chicken pox    Heart murmur    Migraines    Stroke [facility])     Past Surgical History:  Procedure Laterality Date   BUBBLE STUDY  03/18/2021   Procedure: BUBBLE STUDY;  Surgeon: Lewayne Bunting, MD;  Location: [facility] ENDOSCOPY;  Service: Cardiovascular;;   CESAREAN SECTION     COLONOSCOPY     TEE WITHOUT CARDIOVERSION N/A 03/18/2021   Procedure: TRANSESOPHAGEAL ECHOCARDIOGRAM (TEE);  Surgeon: Lewayne Bunting, MD;  Location: [facility] ENDOSCOPY;  Service: Cardiovascular;  Laterality: N/A;   TONSILLECTOMY  1959    Current Medications: Current Meds  Medication Sig   albuterol (VENTOLIN HFA) 108 (90 Base) MCG/ACT inhaler Inhale 2 puffs into the lungs every 6 (six) hours as needed for wheezing or shortness of breath.   alendronate (FOSAMAX) 70 MG tablet Take 1 tablet (70 mg total) by mouth every 7 (seven) days. Take with a full glass of water on an empty stomach.   apixaban (ELIQUIS) 5 MG TABS tablet Take 1 tablet (5 mg total) by mouth 2 (two) times daily.   cholecalciferol (VITAMIN D3) 25 MCG (1000 UNIT) tablet Take 1,000 Units by mouth daily.   empagliflozin (  JARDIANCE) 10 MG TABS tablet Take 1 tablet (10 mg total) by mouth daily before breakfast.   fluticasone (FLOVENT HFA) 110 MCG/ACT inhaler Inhale 1 puff into the lungs in the morning and at bedtime.   losartan (COZAAR) 25 MG tablet Take 1 tablet (25 mg total) by mouth daily.   metoprolol succinate (TOPROL-XL) 25 MG 24 hr tablet Take 0.5 tablets (12.5 mg total) by mouth daily. Take with or immediately following a meal.   Misc Natural Products (OSTEO BI-FLEX ADV JOINT SHIELD) TABS Take 1 tablet by mouth daily.   OVER  THE COUNTER MEDICATION Take 1 tablet by mouth daily. NEURIVA   Spacer/Aero-Holding Chambers (AEROCHAMBER PLUS) inhaler Use as instructed to use with inahaler.   vitamin C (ASCORBIC ACID) 500 MG tablet Take 500 mg by mouth daily.   vitamin E 200 UNIT capsule Take 200 Units by mouth daily.     Allergies:   Tetracyclines & related   Social History   Tobacco Use   Smoking status: Never   Smokeless tobacco: Never  Vaping Use   Vaping Use: Never used  Substance Use Topics   Alcohol use: Yes    Comment: occasionally   Drug use: Yes    Frequency: 2.0 times per week    Types: Marijuana     Family History: The patient's family history includes Arthritis in her father and mother; Cancer in her brother, brother, and father; Colon polyps in her mother; Diabetes in her mother; Hypertension in her mother; Stroke in her mother. There is no history of Colon cancer, Esophageal cancer, Rectal cancer, or Stomach cancer.  ROS:   Please see the history of present illness.    All other systems reviewed and are negative.  EKGs/Labs/Other Studies Reviewed:    The following studies were reviewed today:  EKG:  NSR, possible LAE, nonspecific T wave abnl.  Imaging studies that I have independently reviewed today: TTE 08/06/21 and TEE 03/18/21  Recent Labs: 03/19/2021: Hemoglobin 14.3; Platelets 207; TSH 1.930 06/06/2021: ALT 16 06/10/2021: BUN 21; Creatinine, Ser 0.80; Potassium 4.4; Sodium 139  Recent Lipid Panel    Component Value Date/Time   CHOL 154 06/06/2021 0821   TRIG 53 06/06/2021 0821   HDL 83 06/06/2021 0821   CHOLHDL 1.9 06/06/2021 0821   CHOLHDL 2.7 03/16/2021 0548   VLDL 14 03/16/2021 0548   LDLCALC 60 06/06/2021 0821    Physical Exam:    VS:  BP 122/70   Pulse 61   Ht 5\' 3"  (1.6 m)   Wt 134 lb 9.6 oz (61.1 kg)   SpO2 99%   BMI 23.84 kg/m     Wt Readings from Last 5 Encounters:  08/27/21 134 lb 9.6 oz (61.1 kg)  07/19/21 133 lb (60.3 kg)  06/10/21 130 lb (59 kg)   05/28/21 129 lb 12.8 oz (58.9 kg)  05/07/21 130 lb 3.2 oz (59.1 kg)    Constitutional: No acute distress Eyes: sclera non-icteric, normal conjunctiva and lids ENMT: normal dentition, moist mucous membranes Cardiovascular: regular rhythm, normal rate, 1-2/6 HSM. S1 and S2 normal. No jugular venous distention.  Respiratory: clear to auscultation bilaterally GI : normal bowel sounds, soft and nontender. No distention.   MSK: extremities warm, well perfused.  Trace bilateral edema.  NEURO: grossly nonfocal exam, moves all extremities. PSYCH: alert and oriented x 3, normal mood and affect.   ASSESSMENT:    1. Acute on chronic combined systolic and diastolic CHF (congestive heart failure) (HCC)   2. HFrEF (heart  failure with reduced ejection fraction) (HCC)   3. Nonrheumatic mitral valve regurgitation   4. Medication management   5. Hyperlipidemia LDL goal <70   6. Cerebrovascular accident (CVA) due to embolism of left carotid artery (HCC)   7. Essential (primary) hypertension   8. Bilateral carotid artery stenosis    PLAN:    Acute on chronic combined systolic and diastolic CHF (congestive heart failure) (HCC) - Plan: EKG 12-Lead, CT CORONARY MORPH W/CTA COR W/SCORE W/CA W/CM &/OR WO/CM, Basic metabolic panel HFrEF (heart failure with reduced ejection fraction) (HCC) - Plan: EKG 12-Lead, CT CORONARY MORPH W/CTA COR W/SCORE W/CA W/CM &/OR WO/CM, Basic metabolic panel -She has a yet unexplained cardiomyopathy and requires an ischemic work-up.  We will start with a coronary CTA.  This study should be retrospectively gated so that I can also evaluate her mitral valve in greater detail which has not been reviewed in detail since her TEE in May.  Her mitral valve does not appear entirely normal and she notes that her son was born with mitral valve disease as well.  I think her mitral valve disease may have a mixed component of primary and secondary, and this will need to be elucidated further.   Suspect her mitral valve regurgitation is at least moderate to severe and if her LVEF and LV stroke-volume were normal, it is likely she would have severe MR.  We will consider cardiac MR versus TEE repeat as we clarify the nature of her cardiomyopathy.  Nonrheumatic mitral valve regurgitation - Plan: EKG 12-Lead, Basic metabolic panel -As above in the HPI and problem #1.  Suspect at least moderate to severe mitral valve regurgitation.  Mixed etiology.  Further imaging will likely be needed but we will evaluate for ischemic heart disease first.  Medication management - Plan: Basic metabolic panel -We will add metoprolol succinate 12.5 mg daily to her regimen for the benefit of her heart failure.  Continue losartan 25 mg daily and Jardiance 10 mg daily.  Hyperlipidemia LDL goal <70 -Most recent LDL is less than 70, continue atorvastatin 20 mg daily  Cerebrovascular accident (CVA) due to embolism of left carotid artery (HCC) -Followed closely by neurology.  No strong indication to continue Eliquis from a cardiovascular standpoint if neurology feels she would be able to be transition to aspirin 81 mg daily and monotherapy.  Essential (primary) hypertension-blood pressure is permissible for adding additional heart failure regimen, I reviewed her log with her today.  Total time of encounter: 30 minutes total time of encounter, including 25 minutes spent in face-to-face patient care on the date of this encounter. This time includes coordination of care and counseling regarding above mentioned problem list. Remainder of non-face-to-face time involved reviewing chart documents/testing relevant to the patient encounter and documentation in the medical record. I have independently reviewed documentation from referring provider.   Weston Brass, MD, [facility] [place]  [facility]   Shared Decision Making/Informed Consent:       Medication Adjustments/Labs and Tests Ordered: Current medicines are  reviewed at length with the patient today.  Concerns regarding medicines are outlined above.   Orders Placed This Encounter  Procedures   CT CORONARY MORPH W/CTA COR W/SCORE W/CA W/CM &/OR WO/CM   Basic metabolic panel   EKG 12-Lead    Meds ordered this encounter  Medications   metoprolol succinate (TOPROL-XL) 25 MG 24 hr tablet    Sig: Take 0.5 tablets (12.5 mg total) by mouth daily. Take with or immediately  following a meal.    Dispense:  45 tablet    Refill:  3   atorvastatin (LIPITOR) 20 MG tablet    Sig: Take 1 tablet (20 mg total) by mouth daily.    Dispense:  90 tablet    Refill:  3     Patient Instructions  Medication Instructions:  START: METOPROLOL SUCCINATE 12.5mg  ONCE DAILY  *If you need a refill on your cardiac medications before your next appointment, please call your pharmacy*  Lab Work: BMET- ONE WEEK PRIOR TO CCTA SCAN  If you have labs (blood work) drawn today and your tests are completely normal, you will receive your results only by: MyChart Message (if you have MyChart) OR A paper copy in the mail If you have any lab test that is abnormal or we need to change your treatment, we will call you to review the results.  Testing/Procedures: Your physician has requested that you have cardiac CT. Cardiac computed tomography (CT) is a painless test that uses an x-ray machine to take clear, detailed pictures of your heart. For further information please visit [email or web address] Please follow instruction sheet as given.  Follow-Up: At [facility], you and your health needs are our priority.  As part of our continuing mission to provide you with exceptional heart care, we have created designated Provider Care Teams.  These Care Teams include your primary Cardiologist (physician) and Advanced Practice Providers (APPs -  Physician Assistants and Nurse Practitioners) who all work together to provide you with the care you need, when you need it.  We recommend signing  up for the patient portal called "MyChart".  Sign up information is provided on this After Visit Summary.  MyChart is used to connect with patients for Virtual Visits (Telemedicine).  Patients are able to view lab/test results, encounter notes, upcoming appointments, etc.  Non-urgent messages can be sent to your provider as well.   To learn more about what you can do with MyChart, go to [email or web address].    Your next appointment:   AS SCHEDULED   The format for your next appointment:   In Person  Provider:   Weston Brass, MD  Other Instructions   Your cardiac CT will be scheduled at one of the below locations:   [facility] [address] [place], [place] [address] [phone]  If scheduled at [facility], please arrive at the [facility] main entrance (entrance A) of [facility] 30 minutes prior to test start time. Proceed to the [facility] Radiology Department (first floor) to check-in and test prep.  Please follow these instructions carefully (unless otherwise directed):  On the Night Before the Test: Be sure to Drink plenty of water. Do not consume any caffeinated/decaffeinated beverages or chocolate 12 hours prior to your test. Do not take any antihistamines 12 hours prior to your test.  On the Day of the Test: Drink plenty of water until 1 hour prior to the test. Do not eat any food 4 hours prior to the test. You may take your regular medications prior to the test.  HOLD Furosemide/Hydrochlorothiazide morning of the test. FEMALES- please wear underwire-free bra if available, avoid dresses & tight clothing      After the Test: Drink plenty of water. After receiving IV contrast, you may experience a mild flushed feeling. This is normal. On occasion, you may experience a mild rash up to 24 hours after the test. This is not dangerous. If this occurs, you can take  Benadryl 25 mg and increase your fluid intake. If you experience  trouble breathing, this can be serious. If it is severe call 911 IMMEDIATELY. If it is mild, please call our office. If you take any of these medications: Glipizide/Metformin, Avandament, Glucavance, please do not take 48 hours after completing test unless otherwise instructed.  Please allow 2-4 weeks for scheduling of routine cardiac CTs. Some insurance companies require a pre-authorization which may delay scheduling of this test.   For non-scheduling related questions, please contact the cardiac imaging nurse navigator should you have any questions/concerns: Rockwell Alexandria, Cardiac Imaging Nurse Navigator Larey Brick, Cardiac Imaging Nurse Navigator [facility] Direct Office Dial: [phone]   For scheduling needs, including cancellations and rescheduling, please call [place], [phone].

## 2021-08-30 DIAGNOSIS — I5043 Acute on chronic combined systolic (congestive) and diastolic (congestive) heart failure: Secondary | ICD-10-CM | POA: Diagnosis not present

## 2021-08-30 DIAGNOSIS — Z79899 Other long term (current) drug therapy: Secondary | ICD-10-CM | POA: Diagnosis not present

## 2021-08-30 DIAGNOSIS — I340 Nonrheumatic mitral (valve) insufficiency: Secondary | ICD-10-CM | POA: Diagnosis not present

## 2021-08-30 DIAGNOSIS — I5020 Unspecified systolic (congestive) heart failure: Secondary | ICD-10-CM | POA: Diagnosis not present

## 2021-08-31 ENCOUNTER — Other Ambulatory Visit: Payer: Self-pay | Admitting: Family Medicine

## 2021-08-31 DIAGNOSIS — I63132 Cerebral infarction due to embolism of left carotid artery: Secondary | ICD-10-CM

## 2021-08-31 LAB — BASIC METABOLIC PANEL
BUN/Creatinine Ratio: 17 (ref 12–28)
BUN: 15 mg/dL (ref 8–27)
CO2: 21 mmol/L (ref 20–29)
Calcium: 9.8 mg/dL (ref 8.7–10.3)
Chloride: 102 mmol/L (ref 96–106)
Creatinine, Ser: 0.87 mg/dL (ref 0.57–1.00)
Glucose: 90 mg/dL (ref 70–99)
Potassium: 4.9 mmol/L (ref 3.5–5.2)
Sodium: 140 mmol/L (ref 134–144)
eGFR: 72 mL/min/{1.73_m2} (ref >59)

## 2021-09-02 ENCOUNTER — Encounter: Payer: Self-pay | Admitting: *Deleted

## 2021-09-04 ENCOUNTER — Encounter: Payer: Self-pay | Admitting: Adult Health

## 2021-09-04 ENCOUNTER — Telehealth (HOSPITAL_COMMUNITY): Payer: Self-pay | Admitting: Emergency Medicine

## 2021-09-04 ENCOUNTER — Ambulatory Visit (INDEPENDENT_AMBULATORY_CARE_PROVIDER_SITE_OTHER): Payer: Medicare Other | Admitting: Adult Health

## 2021-09-04 VITALS — BP 124/78 | HR 72 | Ht 63.0 in | Wt 135.2 lb

## 2021-09-04 DIAGNOSIS — I5020 Unspecified systolic (congestive) heart failure: Secondary | ICD-10-CM | POA: Diagnosis not present

## 2021-09-04 DIAGNOSIS — I6522 Occlusion and stenosis of left carotid artery: Secondary | ICD-10-CM

## 2021-09-04 DIAGNOSIS — I63132 Cerebral infarction due to embolism of left carotid artery: Secondary | ICD-10-CM | POA: Diagnosis not present

## 2021-09-04 MED ORDER — ASPIRIN EC 81 MG PO TBEC: 81.0000 mg | DELAYED_RELEASE_TABLET | Freq: Every day | ORAL | 11 refills | Status: AC

## 2021-09-04 NOTE — Telephone Encounter (Signed)
Reaching out to patient to offer assistance regarding upcoming cardiac imaging study; pt verbalizes understanding of appt date/time, parking situation and where to check in, pre-test NPO status and medications ordered, and verified current allergies; name and call back number provided for further questions should they arise Leah Fancher RN Navigator Cardiac Imaging [facility] [phone] office [phone] cell  Daily meds Denies iv issues Denies claustro  

## 2021-09-04 NOTE — Progress Notes (Signed)
[facility] [address] [place]. [place] [address] [phone]       OFFICE CONSULT NOTE  [name] Date of Birth:  04-Jul-1951 Medical Record Number:  [id number]   Referring MD: Leah Pierce  Reason for Referral: Stroke  Chief Complaint  Patient presents with   Cerebrovascular Accident    Rm 2, 3 month FU  "no new concerns"     HPI:   Update 09/04/2021 JM: Returns for 69-month stroke follow-up unaccompanied.  Overall doing well without new or reoccurring stroke/TIA symptoms.  Repeat 2D echo 08/06/2021 showed EF 35 to 40% showing improvement of prior EF at 30 to 35%.  She has remained on Eliquis and her recent cardiology visit, "no strong indication to continue Eliquis from a cardiovascular standpoint if neurology feels to be able to transition to aspirin 81 mg daily monotherapy".  She is currently undergoing ischemic cardiac work-up for unexplained cardiomyopathy.  Remains on atorvastatin 20 mg daily without side effects.  Blood pressure today 124/78. Monitors at home and has been stable.  No new concerns at this time.     History provided for reference purposes only Initial visit 05/28/2021 Dr. Pearlean Pierce: [name] is a 70 year old African-American lady seen today for initial office consultation visit for stroke.  History is obtained from the patient and review of electronic medical records and I personally reviewed available imaging films in PACS. She has past medical history of prediabetes, hyperlipidemia, migraines, asthma who presented on 03/16/2021 with sudden onset of aphasia and mild right facial droop as well as right leg drift and some confusion.  CTA head was unremarkable but CT angiogram revealed terminal left ICA occlusion extending proximally into the left A1 segment of the ACA and the M1 segment of the middle cerebral artery.  She was treated with IV tPA and showed significant improvement and mechanical thrombectomy was not done.  She was kept  in the ICU and blood pressure was tightly controlled.  MRI scan showed 3 tiny punctate acute infarcts in the left MCA territory involving deep insular cortex and white matter and corona radiator and left parietal cortex.  Transthoracic echo showed diminished ejection fraction of 30 to 35% which was felt to be new.  TEE showed moderate to severe global reduction in LV systolic function with severe left atrial enlargement but no left atrial thrombus and no PFO however there were delayed bubbles suggestive of intrapulmonary shunt.  Lower extremity venous Dopplers were negative for DVT.  LDL cholesterol is 1 1 8  mg percent and hemoglobin A1c was 5.9.  Urine drug screen was positive for cannabis.  Patient was seen by cardiology who recommended metoprolol and Entresto and anticoagulation with Eliquis for 3 months with repeat echo.  In 3 months.  Patient states she is done extremely well since discharge.  She has had no residual neurological deficits or facial droop is resolved completely.  She is tolerating Eliquis well with only minor bruising and no bleeding.  She had a 30-day outpatient heart monitor which was negative for atrial fibrillation.  She is returned back to baseline and has no complaints.  She has a follow-up echocardiogram and cardiology appointment planned for August.  Her blood pressure at home is well controlled today it is elevated in office at 150/96.  She is tolerating Lipitor well without muscle aches and pains.  ROS:   14 system review of systems is positive for no complaints and all other systems are negative   PMH:  Past  Medical History:  Diagnosis Date   Allergy    Arthritis    Asthma    hx of   Chicken pox    Heart murmur    Migraines    Stroke [facility])     Social History:  Social History   Socioeconomic History   Marital status: Single    Spouse name: Not on file   Number of children: 1   Years of education: Not on file   Highest education level: Not on file   Occupational History   Not on file  Tobacco Use   Smoking status: Never   Smokeless tobacco: Never  Vaping Use   Vaping Use: Never used  Substance and Sexual Activity   Alcohol use: Yes    Comment: occasionally   Drug use: Yes    Frequency: 1.0 times per week    Types: Marijuana   Sexual activity: Not on file  Other Topics Concern   Not on file  Social History Narrative   Lives alone, her son dies   Right Handed   Drinks 1 cups caffeine daily   Social Determinants of Health   Financial Resource Strain: Low Risk    Difficulty of Paying Living Expenses: Not hard at all  Food Insecurity: No Food Insecurity   Worried About Programme researcher, broadcasting/film/video in the Last Year: Never true   Barista in the Last Year: Never true  Transportation Needs: No Transportation Needs   Lack of Transportation (Medical): No   Lack of Transportation (Non-Medical): No  Physical Activity: Sufficiently Active   Days of Exercise per Week: 4 days   Minutes of Exercise per Session: 40 min  Stress: No Stress Concern Present   Feeling of Stress : Not at all  Social Connections: Socially Isolated   Frequency of Communication with Friends and Family: More than three times a week   Frequency of Social Gatherings with Friends and Family: More than three times a week   Attends Religious Services: Never   Database administrator or Organizations: No   Attends Engineer, structural: Never   Marital Status: Divorced  Catering manager Violence: Not At Risk   Fear of Current or Ex-Partner: No   Emotionally Abused: No   Physically Abused: No   Sexually Abused: No    Medications:   Current Outpatient Medications on File Prior to Visit  Medication Sig Dispense Refill   albuterol (VENTOLIN HFA) 108 (90 Base) MCG/ACT inhaler Inhale 2 puffs into the lungs every 6 (six) hours as needed for wheezing or shortness of breath. 18 g 3   alendronate (FOSAMAX) 70 MG tablet Take 1 tablet (70 mg total) by mouth  every 7 (seven) days. Take with a full glass of water on an empty stomach. 12 tablet 3   atorvastatin (LIPITOR) 20 MG tablet Take 1 tablet (20 mg total) by mouth daily. 90 tablet 3   cholecalciferol (VITAMIN D3) 25 MCG (1000 UNIT) tablet Take 1,000 Units by mouth daily.     ELIQUIS 5 MG TABS tablet TAKE 1 TABLET BY MOUTH TWICE A DAY 60 tablet 2   empagliflozin (JARDIANCE) 10 MG TABS tablet Take 1 tablet (10 mg total) by mouth daily before breakfast. 30 tablet 3   fluticasone (FLOVENT HFA) 110 MCG/ACT inhaler Inhale 1 puff into the lungs in the morning and at bedtime. 1 Inhaler 3   losartan (COZAAR) 25 MG tablet Take 1 tablet (25 mg total) by mouth daily. 90 tablet  2   metoprolol succinate (TOPROL-XL) 25 MG 24 hr tablet Take 0.5 tablets (12.5 mg total) by mouth daily. Take with or immediately following a meal. 45 tablet 3   Misc Natural Products (OSTEO BI-FLEX ADV JOINT SHIELD) TABS Take 1 tablet by mouth daily.     OVER THE COUNTER MEDICATION Take 1 tablet by mouth daily. NEURIVA     Spacer/Aero-Holding Chambers (AEROCHAMBER PLUS) inhaler Use as instructed to use with inahaler. 1 each 1   vitamin C (ASCORBIC ACID) 500 MG tablet Take 500 mg by mouth daily.     vitamin E 200 UNIT capsule Take 200 Units by mouth daily.     No current facility-administered medications on file prior to visit.    Allergies:   Allergies  Allergen Reactions   Tetracyclines & Related     Rash on hands    Physical Exam Today's Vitals   09/04/21 1103  BP: 124/78  Pulse: 72  Weight: 135 lb 3.2 oz (61.3 kg)  Height: 5\' 3"  (1.6 m)   Body mass index is 23.95 kg/m.   General: Pleasant middle-age lady d, seated, in no evident distress Head: head normocephalic and atraumatic.   Neck: supple with no carotid or supraclavicular bruits Cardiovascular: regular rate and rhythm, no murmurs Musculoskeletal: no deformity Skin:  no rash/petichiae Vascular:  Normal pulses all extremities  Neurologic Exam Mental  Status: Awake and fully alert.  Fluent speech and language.  Oriented to place and time. Recent and remote memory intact. Attention span, concentration and fund of knowledge appropriate. Mood and affect appropriate.  Cranial Nerves: Pupils equal, briskly reactive to light. Extraocular movements full without nystagmus. Visual fields full to confrontation. Hearing intact. Facial sensation intact. Face, tongue, palate moves normally and symmetrically.  Motor: Normal bulk and tone. Normal strength in all tested extremity muscles. Sensory.: intact to touch , pinprick , position and vibratory sensation.  Coordination: Rapid alternating movements normal in all extremities. Finger-to-nose and heel-to-shin performed accurately bilaterally. Gait and Station: Arises from chair without difficulty. Stance is normal. Gait demonstrates normal stride length and balance . Able to heel, toe and tandem walk without difficulty.  Reflexes: 1+ and symmetric. Toes downgoing.      ASSESSMENT: 70 year old lady with left MCA tiny branch infarct and April 2022 secondary to left carotid occlusion with distal embolization treated with IV tPA with excellent clinical recovery.  Vascular risk factors of carotid occlusion, cardiomyopathy, , hypertension and hyperlipidemia.     Pierce:  -Okay to stop Eliquis and transition to aspirin 81 mg daily as recent 2D echo showed improvement of EF at 35 to 40% (per Dr. May 2022, okay to stop eliquis once EF>35% -Continue atorvastatin 20 mg daily -Obtain carotid ultrasound for left carotid occlusion and mild right carotid stenosis - maintain strict control of hypertension with blood pressure goal below 130/90, diabetes with hemoglobin A1c goal below 6.5% and lipids with LDL cholesterol goal below 70 mg/dL.  - recent LDL 60 (05/2021) -continue to follow with cardiology for further ischemic cardiac work up for unexplained cardiomyopathy    Follow-up in 1 year or call earlier if  needed   CC:  07-18-1997, [place], MD   I spent 32 minutes of face-to-face and non-face-to-face time with patient.  This included previsit chart review, lab review, study review, order entry, electronic health record documentation, patient education and discussion regarding history of prior stroke and etiology, secondary stroke prevention measures and aggressive stroke risk factor management, indication to stop Eliquis and initiate  aspirin and answered all the questions to patient's satisfaction  Ihor Austin, [facility]  [facility] [address] [place], [place] [address]  Phone [phone] Fax [phone] Note: This document was prepared with digital dictation and possible smart phrase technology. Any transcriptional errors that result from this process are unintentional.

## 2021-09-04 NOTE — Telephone Encounter (Signed)
Attempted to call patient regarding upcoming cardiac CT appointment. °Left message on voicemail with name and callback number °Lexa Coronado RN Navigator Cardiac Imaging °[facility] °[phone] Office °[phone] Cell ° °

## 2021-09-04 NOTE — Patient Instructions (Addendum)
Okay to stop Eliquis at this time and start aspirin 81mg  daily and continue atorvastatin 20 mg daily  for secondary stroke prevention  You will be called to schedule a carotid ultrasound to evaluate for stenosis   Continue to follow with cardiology as scheduled  Continue to follow up with PCP regarding cholesterol and blood pressure management  Maintain strict control of hypertension with blood pressure goal below 130/90 and cholesterol with LDL cholesterol (bad cholesterol) goal below 70 mg/dL.     Follow up in 1 year or call earlier if needed     Thank you for coming to see at [facility]. I hope we have been able to provide you high quality care today.  You may receive a patient satisfaction survey over the next few weeks. We would appreciate your feedback and comments so that we may continue to improve ourselves and the health of our patients.

## 2021-09-05 ENCOUNTER — Other Ambulatory Visit: Payer: Self-pay

## 2021-09-05 ENCOUNTER — Ambulatory Visit (HOSPITAL_COMMUNITY)
Admission: RE | Admit: 2021-09-05 | Discharge: 2021-09-05 | Disposition: A | Discharge status: Home / Self Care | Payer: Medicare Other | Source: Ambulatory Visit | Attending: Internal Medicine | Admitting: Internal Medicine

## 2021-09-05 DIAGNOSIS — I5020 Unspecified systolic (congestive) heart failure: Secondary | ICD-10-CM | POA: Diagnosis not present

## 2021-09-05 DIAGNOSIS — I5043 Acute on chronic combined systolic (congestive) and diastolic (congestive) heart failure: Secondary | ICD-10-CM | POA: Insufficient documentation

## 2021-09-05 MED ORDER — METOPROLOL TARTRATE 5 MG/5ML IV SOLN
INTRAVENOUS | Status: AC
  Administered 2021-09-05: 5 mg via INTRAVENOUS
  Filled 2021-09-05: qty 5

## 2021-09-05 MED ORDER — NITROGLYCERIN 0.4 MG SL SUBL: 0.8000 mg | SUBLINGUAL_TABLET | Freq: Once | SUBLINGUAL | Status: AC

## 2021-09-05 MED ORDER — IOHEXOL 350 MG/ML SOLN
95.0000 mL | Freq: Once | INTRAVENOUS | Status: AC | PRN
  Administered 2021-09-05: 95 mL via INTRAVENOUS

## 2021-09-05 MED ORDER — METOPROLOL TARTRATE 5 MG/5ML IV SOLN: 10.0000 mg | INTRAVENOUS | Status: DC | PRN

## 2021-09-05 MED ORDER — NITROGLYCERIN 0.4 MG SL SUBL
SUBLINGUAL_TABLET | SUBLINGUAL | Status: AC
  Administered 2021-09-05: 0.8 mg via SUBLINGUAL
  Filled 2021-09-05: qty 2

## 2021-09-11 ENCOUNTER — Telehealth: Payer: Self-pay | Admitting: Internal Medicine

## 2021-09-11 NOTE — Telephone Encounter (Signed)
Attempted to call patient, left message for patient to call back to office.   

## 2021-09-11 NOTE — Telephone Encounter (Signed)
   Pt is calling, wanted to know when will she be getting her CT result

## 2021-09-13 NOTE — Telephone Encounter (Signed)
Message  I was planning to discuss with her at follow up coming up.    Minimal calcium and minimal plaque in her arteries. This is great news but doesn't explain why the heart is pumping weak. I think we need to decide what to do next with her mitral valve which is likely the main problem.  We will discuss next steps with the valve when I see her next week.  GA    Left message to call back

## 2021-09-13 NOTE — Telephone Encounter (Signed)
Spoke with patient. Explained CT is pending final review from MD. Explained she is in the hospital doing procedures today so cannot guarantee will have a response today, but will call back as soon as we have a reply

## 2021-09-13 NOTE — Telephone Encounter (Signed)
Patient is returning call.  °

## 2021-09-17 ENCOUNTER — Ambulatory Visit (INDEPENDENT_AMBULATORY_CARE_PROVIDER_SITE_OTHER): Payer: Medicare Other | Admitting: Internal Medicine

## 2021-09-17 ENCOUNTER — Other Ambulatory Visit: Payer: Self-pay

## 2021-09-17 ENCOUNTER — Encounter: Payer: Self-pay | Admitting: Internal Medicine

## 2021-09-17 VITALS — BP 138/78 | HR 78 | Ht 63.0 in | Wt 134.4 lb

## 2021-09-17 DIAGNOSIS — I63132 Cerebral infarction due to embolism of left carotid artery: Secondary | ICD-10-CM

## 2021-09-17 DIAGNOSIS — I5043 Acute on chronic combined systolic (congestive) and diastolic (congestive) heart failure: Secondary | ICD-10-CM

## 2021-09-17 DIAGNOSIS — E785 Hyperlipidemia, unspecified: Secondary | ICD-10-CM

## 2021-09-17 DIAGNOSIS — I340 Nonrheumatic mitral (valve) insufficiency: Secondary | ICD-10-CM | POA: Diagnosis not present

## 2021-09-17 DIAGNOSIS — I10 Essential (primary) hypertension: Secondary | ICD-10-CM | POA: Diagnosis not present

## 2021-09-17 DIAGNOSIS — I5020 Unspecified systolic (congestive) heart failure: Secondary | ICD-10-CM | POA: Diagnosis not present

## 2021-09-17 DIAGNOSIS — Z79899 Other long term (current) drug therapy: Secondary | ICD-10-CM

## 2021-09-17 DIAGNOSIS — I6523 Occlusion and stenosis of bilateral carotid arteries: Secondary | ICD-10-CM

## 2021-09-17 NOTE — Progress Notes (Signed)
Cardiology Office Note:    Date:  09/17/2021   ID:  [name], DOB [birth year], MRN [id number]  PCP:  Martinique, Betty G, MD  Cardiologist:  Elouise Munroe, MD  Electrophysiologist:  None   Referring MD: Martinique, Betty G, MD   Chief Complaint/Reason for Referral: MV disease, stroke, HFrEF  History of Present Illness:    Leah Pierce is a 70 y.o. female with a history of acute systolic HF, mitral valve regurgitation with abnormal appearing mitral valve, CVA, HLD, prediabetes, asthma. Presents for follow up.   Feels well overall with NYHA class I symptoms.  Continues to work at [employer] and is active.  Coronary CTA performed to evaluate nature of cardiomyopathy minimal coronary artery disease.  Due to technical difficulties the mitral valve was incompletely assessed on cardiac CT despite my request for retrospective gating.  Nevertheless, report suggests and her mitral leaflet is thickened and possibly represents prior inflammatory pathology, mechanism of MR felt to be tethering and restricted movement of the posterior mitral leaflet in systole, Carpentier type IIIb.  We discussed that for full evaluation of the degree of mitral valve regurgitation, cardiac MRI may be the next best step.  This will help [place] understand the nature of her cardiomyopathy further given that it is nonischemic and if no cardiomyopathic process identified, we may be able to attribute her reduced ejection fraction to mitral valve regurgitation.  I have requested quantitation of the mitral valve regurgitation on MRI.  She is in agreement and willing to proceed.  We reviewed that if mitral valve regurgitation is significant enough to cause her cardiomyopathy, the neck step may be recommending that she have mitral valve replacement or repair, which may necessitate referral to [facility].  The patient denies chest pain, chest pressure, dyspnea at rest or with exertion, palpitations, PND, orthopnea,  or leg swelling. Denies cough, fever, chills. Denies nausea, vomiting. Denies syncope or presyncope. Denies dizziness or lightheadedness.  Prior visits: I initially met the patient when she was admitted for stroke and was noted to have a new cardiomyopathy and abnormal mitral valve.  She had a viral infection in the first part of April 2022, preceding stroke and HF diagnosis. Unclear if CM represents viral etiology.   Reviewed TTE from 08/06/21 and TEE from 03/18/21. MR is at least moderate severe. Mechanism appears mixed with some primary leaflet dysfunction of the anterior leaflet, and secondary MR with leaflet restriction, quite notable with the secondary chords attaching to the anterior leaflet. EF appears 40% on most recent TTE.   She occasionally has paroxysms of shortness of breath for which she uses her rescue inhaler prescribed by Dr. Martinique.  She does have a diagnosis of asthma since childhood, but she is not having to frequently use her inhaler.  She is NYHA class I symptoms for the most part, occasionally class II.   Family history of heart disease.  It does sound like her brother had a cardiomyopathy for which she saw Dr. Haroldine Laws.  She notes a strong family history of heart disease and wonders if some of her heart trouble could be genetic. At her last visit, we discussed in great detail the natural history of cardiomyopathy and the work-up involved, including initially ruling out ischemic heart disease and subsequently pursuing a valvular, postviral, or genetic cardiomyopathic process.   Past Medical History:  Diagnosis Date   Allergy    Arthritis    Asthma    hx of   Chicken pox  Heart murmur    Migraines    Stroke [facility])     Past Surgical History:  Procedure Laterality Date   BUBBLE STUDY  03/18/2021   Procedure: BUBBLE STUDY;  Surgeon: Lelon Perla, MD;  Location: [place];  Service: Cardiovascular;;   CESAREAN SECTION     COLONOSCOPY     TEE WITHOUT CARDIOVERSION  N/A 03/18/2021   Procedure: TRANSESOPHAGEAL ECHOCARDIOGRAM (TEE);  Surgeon: Lelon Perla, MD;  Location: [facility];  Service: Cardiovascular;  Laterality: N/A;   TONSILLECTOMY  1959    Current Medications: Current Meds  Medication Sig   albuterol (VENTOLIN HFA) 108 (90 Base) MCG/ACT inhaler Inhale 2 puffs into the lungs every 6 (six) hours as needed for wheezing or shortness of breath.   alendronate (FOSAMAX) 70 MG tablet Take 1 tablet (70 mg total) by mouth every 7 (seven) days. Take with a full glass of water on an empty stomach.   aspirin EC 81 MG tablet Take 1 tablet (81 mg total) by mouth daily. Swallow whole.   atorvastatin (LIPITOR) 20 MG tablet Take 1 tablet (20 mg total) by mouth daily.   cholecalciferol (VITAMIN D3) 25 MCG (1000 UNIT) tablet Take 1,000 Units by mouth daily.   empagliflozin (JARDIANCE) 10 MG TABS tablet Take 1 tablet (10 mg total) by mouth daily before breakfast.   fluticasone (FLOVENT HFA) 110 MCG/ACT inhaler Inhale 1 puff into the lungs in the morning and at bedtime.   losartan (COZAAR) 25 MG tablet Take 1 tablet (25 mg total) by mouth daily.   metoprolol succinate (TOPROL-XL) 25 MG 24 hr tablet Take 0.5 tablets (12.5 mg total) by mouth daily. Take with or immediately following a meal.   Misc Natural Products (OSTEO BI-FLEX ADV JOINT SHIELD) TABS Take 1 tablet by mouth daily.   OVER THE COUNTER MEDICATION Take 1 tablet by mouth daily. NEURIVA   Spacer/Aero-Holding Chambers (AEROCHAMBER PLUS) inhaler Use as instructed to use with inahaler.   vitamin C (ASCORBIC ACID) 500 MG tablet Take 500 mg by mouth daily.   vitamin E 200 UNIT capsule Take 200 Units by mouth daily.     Allergies:   Tetracyclines & related   Social History   Tobacco Use   Smoking status: Never   Smokeless tobacco: Never  Vaping Use   Vaping Use: Never used  Substance Use Topics   Alcohol use: Yes    Comment: occasionally   Drug use: Yes    Frequency: 1.0 times per week    Types:  Marijuana     Family History: The patient's family history includes Arthritis in her father and mother; Cancer in her brother, brother, and father; Colon polyps in her mother; Diabetes in her mother; Hypertension in her mother; Stroke in her mother. There is no history of Colon cancer, Esophageal cancer, Rectal cancer, or Stomach cancer.  ROS:   Please see the history of present illness.    All other systems reviewed and are negative.  EKGs/Labs/Other Studies Reviewed:    The following studies were reviewed today: Echo 08/06/21  1. Left ventricular ejection fraction, by estimation, is 35 to 40%. The  left ventricle has moderately decreased function. The left ventricle  demonstrates global hypokinesis. Left ventricular diastolic parameters are  indeterminate.   2. Right ventricular systolic function is normal. The right ventricular  size is normal.   3. Left atrial size was moderately dilated.   4. The mitral valve is normal in structure. Moderate to severe mitral  valve regurgitation.  No evidence of mitral stenosis. There is mild  holosystolic prolapse of the middle segment of the anterior leaflet of the  mitral valve.   5. The aortic valve is normal in structure. Aortic valve regurgitation is  not visualized. No aortic stenosis is present.   6. The inferior vena cava is normal in size with greater than 50%  respiratory variability, suggesting right atrial pressure of 3 mmHg.   Monitor 04/24/21 IMPRESSION: No atrial fibrillation in setting of recent stroke.   Echo TEE 03/18/21 1. Left ventricular ejection fraction, by estimation, is 30 to 35%. The  left ventricle has moderately decreased function. The left ventricle  demonstrates global hypokinesis.   2. Right ventricular systolic function is normal. The right ventricular  size is normal.   3. Left atrial size was severely dilated. No left atrial/left atrial  appendage thrombus was detected.   4. The mitral valve is abnormal.  Moderate to severe mitral valve  regurgitation. There is mild late systolic prolapse of the middle segment  of the anterior leaflet of the mitral valve.   5. The aortic valve is tricuspid. Aortic valve regurgitation is not  visualized.   6. There is mild (Grade II) plaque involving the descending aorta.   7. Evidence of atrial level shunting detected by color flow Doppler.  Agitated saline contrast bubble study was positive with shunting observed  after >6 cardiac cycles suggestive of intrapulmonary shunting.   EKG:   08/27/21: NSR, possible LAE, nonspecific T wave abnl.  Imaging studies that I have independently reviewed today: TTE 08/06/21 and TEE 03/18/21, CCTA 09/05/21  Recent Labs: 03/19/2021: Hemoglobin 14.3; Platelets 207; TSH 1.930 06/06/2021: ALT 16 08/30/2021: BUN 15; Creatinine, Ser 0.87; Potassium 4.9; Sodium 140  Recent Lipid Panel    Component Value Date/Time   CHOL 154 06/06/2021 0821   TRIG 53 06/06/2021 0821   HDL 83 06/06/2021 0821   CHOLHDL 1.9 06/06/2021 0821   CHOLHDL 2.7 03/16/2021 0548   VLDL 14 03/16/2021 0548   LDLCALC 60 06/06/2021 0821    Physical Exam:    VS:  BP 138/78   Pulse 78   Ht 5' 3"  (1.6 m)   Wt 134 lb 6.4 oz (61 kg)   SpO2 99%   BMI 23.81 kg/m     Wt Readings from Last 5 Encounters:  09/17/21 134 lb 6.4 oz (61 kg)  09/04/21 135 lb 3.2 oz (61.3 kg)  08/27/21 134 lb 9.6 oz (61.1 kg)  07/19/21 133 lb (60.3 kg)  06/10/21 130 lb (59 kg)    Constitutional: No acute distress Eyes: sclera non-icteric, normal conjunctiva and lids ENMT: normal dentition, moist mucous membranes Cardiovascular: regular rhythm, normal rate, 2/6 HSM. S1 and S2 normal. No jugular venous distention.  Respiratory: clear to auscultation bilaterally GI : normal bowel sounds, soft and nontender. No distention.   MSK: extremities warm, well perfused.  Trace bilateral edema.  NEURO: grossly nonfocal exam, moves all extremities. PSYCH: alert and oriented x 3, normal  mood and affect.   ASSESSMENT:    1. HFrEF (heart failure with reduced ejection fraction) ([place])   2. Acute on chronic combined systolic and diastolic CHF (congestive heart failure) ([place])   3. Nonrheumatic mitral valve regurgitation   4. Mitral valve insufficiency, unspecified etiology   5. Medication management   6. Hyperlipidemia LDL goal <70   7. Cerebrovascular accident (CVA) due to embolism of left carotid artery ([place])   8. Essential (primary) hypertension   9. Bilateral carotid artery  stenosis     PLAN:    Acute on chronic combined systolic and diastolic CHF (congestive heart failure) (HCC) -  HFrEF (heart failure with reduced ejection fraction) ([place]) - -Coronary CTA performed with minimal CAD.  Neck step will be to evaluate her cardiomyopathy with cardiac MRI for both myocardial assessment as well as mitral valve regurgitation quantitation. -Her mitral valve does not appear entirely normal and she notes that her son was born with mitral valve disease as well.  I think her mitral valve disease may have a mixed component of primary and secondary, and this will need to be elucidated further.  Suspect her mitral valve regurgitation is at least moderate to severe and if her LVEF and LV stroke-volume were normal, it is likely she would have severe MR.    Nonrheumatic mitral valve regurgitation -  -As above in the HPI and problem #1.  Suspect at least moderate to severe mitral valve regurgitation.  Mixed etiology.  Further imaging with cardiac MRI as noted above.  Medication management -  -Since last visit, doing well on metoprolol succinate 12.5 mg daily -Continue losartan 25 mg daily and Jardiance 10 mg daily. -We have room on the blood pressure to add spironolactone, will consider this at next follow-up.  Potassium on the higher end of normal at last electrolyte panel, would reassess prior to starting.  Hyperlipidemia LDL goal <70 -Most recent LDL is less than 70, continue  atorvastatin 20 mg daily  Cerebrovascular accident (CVA) due to embolism of left carotid artery ([place]) -Followed closely by neurology.  Agreed that Eliquis no longer indicated from cardiac or neurologic standpoint.  Continue aspirin 81 mg daily.  Essential (primary) hypertension-blood pressure is permissible for adding additional heart failure regimen, and is overall well controlled.  Total time of encounter: 30 minutes total time of encounter, including 20 minutes spent in face-to-face patient care on the date of this encounter. This time includes coordination of care and counseling regarding above mentioned problem list. Remainder of non-face-to-face time involved reviewing chart documents/testing relevant to the patient encounter and documentation in the medical record. I have independently reviewed documentation from referring provider.   Cherlynn Kaiser, MD, [place]    Shared Decision Making/Informed Consent:       Medication Adjustments/Labs and Tests Ordered: Current medicines are reviewed at length with the patient today.  Concerns regarding medicines are outlined above.   Orders Placed This Encounter  Procedures   MR CARDIAC MORPHOLOGY W WO CONTRAST   CBC     No orders of the defined types were placed in this encounter.   Patient Instructions  Medication Instructions:  No Changes In Medications at this time.  *If you need a refill on your cardiac medications before your next appointment, please call your pharmacy*  Lab Work: [place] MRI  If you have labs (blood work) drawn today and your tests are completely normal, you will receive your results only by: [place] (if you have MyChart) OR A paper copy in the mail If you have any lab test that is abnormal or we need to change your treatment, we will call you to review the results.  Testing/Procedures: Your physician has requested that you have a cardiac MRI.  Cardiac MRI uses a computer to create images of your heart as its beating, producing both still and moving pictures of your heart and major blood vessels. For further information please visit [email or web address] Please follow the  instruction sheet given to you today for more information. SOMEONE WILL REACH OUT TO YOU TO SCHEDULE THIS  Follow-Up: At [facility], you and your health needs are our priority.  As part of our continuing mission to provide you with exceptional heart care, we have created designated Provider Care Teams.  These Care Teams include your primary Cardiologist (physician) and Advanced Practice Providers (APPs -  Physician Assistants and Nurse Practitioners) who all work together to provide you with the care you need, when you need it.  We recommend signing up for the patient portal called "MyChart".  Sign up information is provided on this After Visit Summary.  MyChart is used to connect with patients for Virtual Visits (Telemedicine).  Patients are able to view lab/test results, encounter notes, upcoming appointments, etc.  Non-urgent messages can be sent to your provider as well.   To learn more about what you can do with MyChart, go to [email or web address].    Your next appointment:   1 month(s)  The format for your next appointment:   In Person  Provider:   Dr. Margaretann Loveless, Dr. Audie Box, Or Dr. Alphonzo Dublin as a scribe for Elouise Munroe, MD.,have documented all relevant documentation on the behalf of Elouise Munroe, MD,as directed by  Elouise Munroe, MD while in the presence of Elouise Munroe, MD.  I, Elouise Munroe, MD, have reviewed all documentation for this visit. The documentation on today's date of service for the exam, diagnosis, procedures, and orders are all accurate and complete.

## 2021-09-17 NOTE — Telephone Encounter (Signed)
Results discussed with patient during OV today 11/1 with Dr. Jacques Navy. Will close this encounter.

## 2021-09-17 NOTE — Patient Instructions (Signed)
Medication Instructions:  No Changes In Medications at this time.  *If you need a refill on your cardiac medications before your next appointment, please call your pharmacy*  Lab Work: CBC- ONE WEEK PRIOR TO CARDIAC MRI  If you have labs (blood work) drawn today and your tests are completely normal, you will receive your results only by: MyChart Message (if you have MyChart) OR A paper copy in the mail If you have any lab test that is abnormal or we need to change your treatment, we will call you to review the results.  Testing/Procedures: Your physician has requested that you have a cardiac MRI. Cardiac MRI uses a computer to create images of your heart as its beating, producing both still and moving pictures of your heart and major blood vessels. For further information please visit [email or web address]. Please follow the instruction sheet given to you today for more information. SOMEONE WILL REACH OUT TO YOU TO SCHEDULE THIS  Follow-Up: At [facility], you and your health needs are our priority.  As part of our continuing mission to provide you with exceptional heart care, we have created designated Provider Care Teams.  These Care Teams include your primary Cardiologist (physician) and Advanced Practice Providers (APPs -  Physician Assistants and Nurse Practitioners) who all work together to provide you with the care you need, when you need it.  We recommend signing up for the patient portal called "MyChart".  Sign up information is provided on this After Visit Summary.  MyChart is used to connect with patients for Virtual Visits (Telemedicine).  Patients are able to view lab/test results, encounter notes, upcoming appointments, etc.  Non-urgent messages can be sent to your provider as well.   To learn more about what you can do with MyChart, go to [email or web address].    Your next appointment:   1 month(s)  The format for your next appointment:   In Person  Provider:   Dr.  Jacques Navy, Dr. Flora Lipps, Or Dr. Bjorn Pippin

## 2021-09-17 NOTE — Progress Notes (Deleted)
Cardiology Office Note:    Date:  09/17/2021   ID:  [name], DOB 01/14/1951, MRN [id number]  PCP:  [place], Betty G, MD  Cardiologist:  Parke Poisson, MD  Electrophysiologist:  None   Referring MD: [place], Betty G, MD   Chief Complaint/Reason for Referral: MV disease, stroke, HFrEF  History of Present Illness:    Leah Pierce is a 70 y.o. female with a history of acute systolic HF, mitral valve regurgitation, CVA, HLD, prediabetes, asthma. Presents for follow up.   Had a viral infection in the first part of April 2022, preceding stroke and HF diagnosis. Unclear if CM represents viral etiology.Still has not had ischemic workup. We will arrange this now. CCTA may be good option, will need HR control.  Reviewed TTE from 08/06/21 and TEE from 03/18/21. MR is at least moderate severe. Mechanism appears mixed with some primary leaflet dysfunction of the anterior leaflet, and secondary MR with leaflet restriction, quite notable with the secondary chords attaching to the anterior leaflet. EF appears 40% on most recent TTE.   Heart Failure Therapy ACE-I/ARB/ARNI: losartan 25 mg daily, recently resumed. BB: none yet MRA: none SGLT2I: jardiance 10 mg daily started by Dr. B. [place] - feels stronger on this medication. Diuretic plan: euvolemic and has not required diuresis recently.  Symptomatically, she has had no significant shortness of breath or chest pain.  She does occasionally have paroxysms of shortness of breath for which she uses her rescue inhaler prescribed by Dr. [place].  She does have a diagnosis of asthma since childhood, but she is not having to frequently use her inhaler.  She is NYHA class I symptoms for the most part, occasionally class II.   Family history of heart disease.  It does sound like her brother had a cardiomyopathy for which she saw Dr. Gala Romney.  She notes a strong family history of heart disease and wonders if some of her heart trouble could be  genetic.  We discussed in great detail the natural history of cardiomyopathy and the work-up involved, including initially ruling out ischemic heart disease and subsequently pursuing a valvular, postviral, or genetic cardiomyopathic process.   Past Medical History:  Diagnosis Date   Allergy    Arthritis    Asthma    hx of   Chicken pox    Heart murmur    Migraines    Stroke [facility])     Past Surgical History:  Procedure Laterality Date   BUBBLE STUDY  03/18/2021   Procedure: BUBBLE STUDY;  Surgeon: Lewayne Bunting, MD;  Location: [facility] ENDOSCOPY;  Service: Cardiovascular;;   CESAREAN SECTION     COLONOSCOPY     TEE WITHOUT CARDIOVERSION N/A 03/18/2021   Procedure: TRANSESOPHAGEAL ECHOCARDIOGRAM (TEE);  Surgeon: Lewayne Bunting, MD;  Location: [facility] ENDOSCOPY;  Service: Cardiovascular;  Laterality: N/A;   TONSILLECTOMY  1959    Current Medications: No outpatient medications have been marked as taking for the 09/17/21 encounter (Appointment) with Parke Poisson, MD.     Allergies:   Tetracyclines & related   Social History   Tobacco Use   Smoking status: Never   Smokeless tobacco: Never  Vaping Use   Vaping Use: Never used  Substance Use Topics   Alcohol use: Yes    Comment: occasionally   Drug use: Yes    Frequency: 1.0 times per week    Types: Marijuana     Family History: The patient's family history includes Arthritis in her father  and mother; Cancer in her brother, brother, and father; Colon polyps in her mother; Diabetes in her mother; Hypertension in her mother; Stroke in her mother. There is no history of Colon cancer, Esophageal cancer, Rectal cancer, or Stomach cancer.  ROS:   Please see the history of present illness.    All other systems reviewed and are negative.  EKGs/Labs/Other Studies Reviewed:    The following studies were reviewed today:  EKG:  NSR, possible LAE, nonspecific T wave abnl.  Imaging studies that I have independently reviewed today:  TTE 08/06/21 and TEE 03/18/21  Recent Labs: 03/19/2021: Hemoglobin 14.3; Platelets 207; TSH 1.930 06/06/2021: ALT 16 08/30/2021: BUN 15; Creatinine, Ser 0.87; Potassium 4.9; Sodium 140  Recent Lipid Panel    Component Value Date/Time   CHOL 154 06/06/2021 0821   TRIG 53 06/06/2021 0821   HDL 83 06/06/2021 0821   CHOLHDL 1.9 06/06/2021 0821   CHOLHDL 2.7 03/16/2021 0548   VLDL 14 03/16/2021 0548   LDLCALC 60 06/06/2021 0821    Physical Exam:    VS:  There were no vitals taken for this visit.    Wt Readings from Last 5 Encounters:  09/04/21 135 lb 3.2 oz (61.3 kg)  08/27/21 134 lb 9.6 oz (61.1 kg)  07/19/21 133 lb (60.3 kg)  06/10/21 130 lb (59 kg)  05/28/21 129 lb 12.8 oz (58.9 kg)    Constitutional: No acute distress Eyes: sclera non-icteric, normal conjunctiva and lids ENMT: normal dentition, moist mucous membranes Cardiovascular: regular rhythm, normal rate, 1-2/6 HSM. S1 and S2 normal. No jugular venous distention.  Respiratory: clear to auscultation bilaterally GI : normal bowel sounds, soft and nontender. No distention.   MSK: extremities warm, well perfused.  Trace bilateral edema.  NEURO: grossly nonfocal exam, moves all extremities. PSYCH: alert and oriented x 3, normal mood and affect.   ASSESSMENT:    1. HFrEF (heart failure with reduced ejection fraction) (HCC)   2. Acute on chronic combined systolic and diastolic CHF (congestive heart failure) (HCC)   3. Nonrheumatic mitral valve regurgitation   4. Mitral valve insufficiency, unspecified etiology   5. Medication management   6. Hyperlipidemia LDL goal <70   7. Cerebrovascular accident (CVA) due to embolism of left carotid artery (HCC)   8. Essential (primary) hypertension   9. Bilateral carotid artery stenosis     PLAN:    Acute on chronic combined systolic and diastolic CHF (congestive heart failure) (HCC) - Plan: EKG 12-Lead, CT CORONARY MORPH W/CTA COR W/SCORE W/CA W/CM &/OR WO/CM, Basic metabolic  panel HFrEF (heart failure with reduced ejection fraction) (HCC) - Plan: EKG 12-Lead, CT CORONARY MORPH W/CTA COR W/SCORE W/CA W/CM &/OR WO/CM, Basic metabolic panel -She has a yet unexplained cardiomyopathy and requires an ischemic work-up.  We will start with a coronary CTA.  This study should be retrospectively gated so that I can also evaluate her mitral valve in greater detail which has not been reviewed in detail since her TEE in May.  Her mitral valve does not appear entirely normal and she notes that her son was born with mitral valve disease as well.  I think her mitral valve disease may have a mixed component of primary and secondary, and this will need to be elucidated further.  Suspect her mitral valve regurgitation is at least moderate to severe and if her LVEF and LV stroke-volume were normal, it is likely she would have severe MR.  We will consider cardiac MR versus TEE repeat as  we clarify the nature of her cardiomyopathy.  Nonrheumatic mitral valve regurgitation - Plan: EKG 12-Lead, Basic metabolic panel -As above in the HPI and problem #1.  Suspect at least moderate to severe mitral valve regurgitation.  Mixed etiology.  Further imaging will likely be needed but we will evaluate for ischemic heart disease first.  Medication management - Plan: Basic metabolic panel -We will add metoprolol succinate 12.5 mg daily to her regimen for the benefit of her heart failure.  Continue losartan 25 mg daily and Jardiance 10 mg daily.  Hyperlipidemia LDL goal <70 -Most recent LDL is less than 70, continue atorvastatin 20 mg daily  Cerebrovascular accident (CVA) due to embolism of left carotid artery (HCC) -Followed closely by neurology.  No strong indication to continue Eliquis from a cardiovascular standpoint if neurology feels she would be able to be transition to aspirin 81 mg daily and monotherapy.  Essential (primary) hypertension-blood pressure is permissible for adding additional heart  failure regimen, I reviewed her log with her today.  Total time of encounter: *** minutes total time of encounter, including *** minutes spent in face-to-face patient care on the date of this encounter. This time includes coordination of care and counseling regarding above mentioned problem list. Remainder of non-face-to-face time involved reviewing chart documents/testing relevant to the patient encounter and documentation in the medical record. I have independently reviewed documentation from referring provider.   Weston Brass, MD, [facility]  [facility]    Shared Decision Making/Informed Consent:       Medication Adjustments/Labs and Tests Ordered: Current medicines are reviewed at length with the patient today.  Concerns regarding medicines are outlined above.   No orders of the defined types were placed in this encounter.   No orders of the defined types were placed in this encounter.    There are no Patient Instructions on file for this visit.

## 2021-09-18 ENCOUNTER — Ambulatory Visit (HOSPITAL_COMMUNITY)
Admission: RE | Admit: 2021-09-18 | Discharge: 2021-09-18 | Disposition: A | Discharge status: Home / Self Care | Payer: Medicare Other | Source: Ambulatory Visit | Attending: Adult Health | Admitting: Adult Health

## 2021-09-18 DIAGNOSIS — I6522 Occlusion and stenosis of left carotid artery: Secondary | ICD-10-CM | POA: Diagnosis not present

## 2021-09-18 NOTE — Progress Notes (Signed)
Carotid artery duplex has been completed. Preliminary results can be found in CV Proc through chart review.   09/18/21 9:16 AM Olen Cordial RVT

## 2021-09-19 ENCOUNTER — Telehealth: Payer: Self-pay

## 2021-09-19 NOTE — Telephone Encounter (Signed)
-----   Message from Ihor Austin, NP sent at 09/19/2021  3:56 PM EDT ----- Please advise patient that recent carotid ultrasound showed mild narrowing of carotid arteries

## 2021-09-19 NOTE — Telephone Encounter (Signed)
I called the pt and advised of results via vm ( ok per dpr). Pt was advised to CB if she had any questions.

## 2021-09-19 NOTE — Telephone Encounter (Signed)
Pt returned call and we discussed results. She verbalized understanding and appreciated the information.

## 2021-10-16 DIAGNOSIS — I5043 Acute on chronic combined systolic (congestive) and diastolic (congestive) heart failure: Secondary | ICD-10-CM | POA: Diagnosis not present

## 2021-10-16 DIAGNOSIS — I340 Nonrheumatic mitral (valve) insufficiency: Secondary | ICD-10-CM | POA: Diagnosis not present

## 2021-10-16 DIAGNOSIS — I5020 Unspecified systolic (congestive) heart failure: Secondary | ICD-10-CM | POA: Diagnosis not present

## 2021-10-16 DIAGNOSIS — Z79899 Other long term (current) drug therapy: Secondary | ICD-10-CM | POA: Diagnosis not present

## 2021-10-17 ENCOUNTER — Ambulatory Visit: Payer: Medicare Other | Admitting: Cardiology

## 2021-10-17 ENCOUNTER — Telehealth (HOSPITAL_COMMUNITY): Payer: Self-pay | Admitting: *Deleted

## 2021-10-17 LAB — CBC
Hematocrit: 44 % (ref 34.0–46.6)
Hemoglobin: 14 g/dL (ref 11.1–15.9)
MCH: 26.7 pg (ref 26.6–33.0)
MCHC: 31.8 g/dL (ref 31.5–35.7)
MCV: 84 fL (ref 79–97)
Platelets: 218 10*3/uL (ref 150–450)
RBC: 5.24 x10E6/uL (ref 3.77–5.28)
RDW: 13.5 % (ref 11.7–15.4)
WBC: 7.1 10*3/uL (ref 3.4–10.8)

## 2021-10-17 NOTE — Telephone Encounter (Signed)
Attempted to call patient regarding upcoming cardiac MRI appointment. Left message on voicemail with name and callback number  Deone Omahoney RN Navigator Cardiac Imaging [facility] [phone] Office [phone] Cell  

## 2021-10-18 ENCOUNTER — Telehealth (HOSPITAL_COMMUNITY): Payer: Self-pay | Admitting: *Deleted

## 2021-10-18 NOTE — Telephone Encounter (Signed)
Reaching out to patient to offer assistance regarding upcoming cardiac imaging study; pt verbalizes understanding of appt date/time, parking situation and where to check in, and verified current allergies; name and call back number provided for further questions should they arise ° °Maday Guarino RN Navigator Cardiac Imaging °[facility] °[phone] office °[phone] cell ° °Denies metal or claustrophobia. °

## 2021-10-21 ENCOUNTER — Other Ambulatory Visit: Payer: Self-pay

## 2021-10-21 ENCOUNTER — Encounter: Payer: Self-pay | Admitting: *Deleted

## 2021-10-21 ENCOUNTER — Ambulatory Visit (HOSPITAL_COMMUNITY)
Admission: RE | Admit: 2021-10-21 | Discharge: 2021-10-21 | Disposition: A | Discharge status: Home / Self Care | Payer: Medicare Other | Source: Ambulatory Visit | Attending: Internal Medicine | Admitting: Internal Medicine

## 2021-10-21 DIAGNOSIS — I5020 Unspecified systolic (congestive) heart failure: Secondary | ICD-10-CM | POA: Diagnosis present

## 2021-10-21 DIAGNOSIS — I5043 Acute on chronic combined systolic (congestive) and diastolic (congestive) heart failure: Secondary | ICD-10-CM | POA: Diagnosis present

## 2021-10-21 DIAGNOSIS — I340 Nonrheumatic mitral (valve) insufficiency: Secondary | ICD-10-CM | POA: Diagnosis present

## 2021-10-21 MED ORDER — GADOBUTROL 1 MMOL/ML IV SOLN
8.0000 mL | Freq: Once | INTRAVENOUS | Status: AC | PRN
  Administered 2021-10-21: 8 mL via INTRAVENOUS

## 2021-10-28 ENCOUNTER — Telehealth: Payer: Self-pay | Admitting: Internal Medicine

## 2021-10-28 NOTE — Telephone Encounter (Signed)
   Pt is returning call to get MRI result. She said if she unable to answer to leave her a detailed message

## 2021-10-28 NOTE — Telephone Encounter (Signed)
Attempted to return call to patient, per patient request left detailed VM with MRI results on patients name verified VM.- advised to call back with any issues.   Parke Poisson, MD       Findings to be discussed in detail at follow up on 12/21 but show moderate-severe MR and no other clear cause of reduced EF such as a abnormal myocardial process.

## 2021-11-03 NOTE — Progress Notes (Signed)
Cardiology Office Note:    Date:  11/07/2021   ID:  Leah Pierce, DOB [birth year], MRN [id number]  PCP:  [place], Betty G, MD  Cardiologist:  Parke Poisson, MD  Electrophysiologist:  None   Referring MD: [place], Betty G, MD   Chief Complaint  Patient presents with   Congestive Heart Failure    History of Present Illness:    Leah Pierce is a 70 y.o. female with a hx of chronic systolic heart failure, mitral valve regurgitation, CVA, asthma, hyperlipidemia, prediabetes who presents for follow-up.  She follows with Dr. Jacques Navy.  She was initially diagnosed with heart failure after echocardiogram 03/16/2021, which showed EF 30 to 35%, global hypokinesis, grade 3 diastolic dysfunction, normal RV function, severe left atrial dilatation, moderate to severe mitral regurgitation.  TEE was done on 03/18/2021 which showed moderate to severe MR.  Repeat echo 08/06/2021 showed EF 35 to 40%, normal RV function, moderate to severe MR.  Coronary CTA on 09/05/2021 showed minimal CAD (calcium score 6, 57th percentile), thickened anterior mitral valve leaflet with no prolapse noted, suspected mechanism of MR restricted movement of posterior mitral valve leaflet in systole (IIIB).  Cardiac MRI on 10/21/2021 showed normal LV size with mild systolic dysfunction (EF 45%), no LGE, moderate to severe MR (regurgitant fraction 39%).  Denies any chest pain, dyspnea, lightheadedness, syncope, lower extremity edema, or palpitations.  Does not exercise.  Works part time at [employer].  BP 110-160s/70-90s at home, pulse 60-70s.    Past Medical History:  Diagnosis Date   Allergy    Arthritis    Asthma    hx of   Chicken pox    Heart murmur    Migraines    Stroke [facility])     Past Surgical History:  Procedure Laterality Date   BUBBLE STUDY  03/18/2021   Procedure: BUBBLE STUDY;  Surgeon: Lewayne Bunting, MD;  Location: [facility] ENDOSCOPY;  Service: Cardiovascular;;   CESAREAN SECTION     COLONOSCOPY     TEE  WITHOUT CARDIOVERSION N/A 03/18/2021   Procedure: TRANSESOPHAGEAL ECHOCARDIOGRAM (TEE);  Surgeon: Lewayne Bunting, MD;  Location: [facility] ENDOSCOPY;  Service: Cardiovascular;  Laterality: N/A;   TONSILLECTOMY  1959    Current Medications: Current Meds  Medication Sig   aspirin EC 81 MG tablet Take 1 tablet (81 mg total) by mouth daily. Swallow whole.   atorvastatin (LIPITOR) 20 MG tablet Take 1 tablet (20 mg total) by mouth daily.   cholecalciferol (VITAMIN D3) 25 MCG (1000 UNIT) tablet Take 1,000 Units by mouth daily.   empagliflozin (JARDIANCE) 10 MG TABS tablet Take 1 tablet (10 mg total) by mouth daily before breakfast.   losartan (COZAAR) 25 MG tablet Take 1 tablet (25 mg total) by mouth daily.   metoprolol succinate (TOPROL-XL) 25 MG 24 hr tablet Take 0.5 tablets (12.5 mg total) by mouth daily. Take with or immediately following a meal.   Misc Natural Products (OSTEO BI-FLEX ADV JOINT SHIELD) TABS Take 1 tablet by mouth daily.   OVER THE COUNTER MEDICATION Take 1 tablet by mouth daily. NEURIVA   Spacer/Aero-Holding Chambers (AEROCHAMBER PLUS) inhaler Use as instructed to use with inahaler.   spironolactone (ALDACTONE) 25 MG tablet Take 0.5 tablets (12.5 mg total) by mouth daily.   vitamin C (ASCORBIC ACID) 500 MG tablet Take 500 mg by mouth daily.   vitamin E 200 UNIT capsule Take 200 Units by mouth daily.     Allergies:   Tetracyclines & related  Social History   Socioeconomic History   Marital status: Single    Spouse name: Not on file   Number of children: 1   Years of education: Not on file   Highest education level: Not on file  Occupational History   Not on file  Tobacco Use   Smoking status: Never   Smokeless tobacco: Never  Vaping Use   Vaping Use: Never used  Substance and Sexual Activity   Alcohol use: Yes    Comment: occasionally   Drug use: Yes    Frequency: 1.0 times per week    Types: Marijuana   Sexual activity: Not on file  Other Topics Concern   Not  on file  Social History Narrative   Lives alone, her son dies   Right Handed   Drinks 1 cups caffeine daily   Social Determinants of Health   Financial Resource Strain: Low Risk    Difficulty of Paying Living Expenses: Not hard at all  Food Insecurity: No Food Insecurity   Worried About Programme researcher, broadcasting/film/video in the Last Year: Never true   Barista in the Last Year: Never true  Transportation Needs: No Transportation Needs   Lack of Transportation (Medical): No   Lack of Transportation (Non-Medical): No  Physical Activity: Sufficiently Active   Days of Exercise per Week: 4 days   Minutes of Exercise per Session: 40 min  Stress: No Stress Concern Present   Feeling of Stress : Not at all  Social Connections: Socially Isolated   Frequency of Communication with Friends and Family: More than three times a week   Frequency of Social Gatherings with Friends and Family: More than three times a week   Attends Religious Services: Never   Database administrator or Organizations: No   Attends Engineer, structural: Never   Marital Status: Divorced     Family History: The patient's family history includes Arthritis in her father and mother; Cancer in her brother, brother, and father; Colon polyps in her mother; Diabetes in her mother; Hypertension in her mother; Stroke in her mother. There is no history of Colon cancer, Esophageal cancer, Rectal cancer, or Stomach cancer.  ROS:   Please see the history of present illness.     All other systems reviewed and are negative.  EKGs/Labs/Other Studies Reviewed:    The following studies were reviewed today:   EKG:  EKG is not ordered today.   Recent Labs: 03/19/2021: TSH 1.930 06/06/2021: ALT 16 10/16/2021: Hemoglobin 14.0; Platelets 218 11/06/2021: BUN 23; Creatinine, Ser 1.03; Potassium 4.5; Sodium 141  Recent Lipid Panel    Component Value Date/Time   CHOL 154 06/06/2021 0821   TRIG 53 06/06/2021 0821   HDL 83 06/06/2021  0821   CHOLHDL 1.9 06/06/2021 0821   CHOLHDL 2.7 03/16/2021 0548   VLDL 14 03/16/2021 0548   LDLCALC 60 06/06/2021 0821    Physical Exam:    VS:  BP 128/84 (BP Location: Left Arm, Patient Position: Sitting, Cuff Size: Normal)    Pulse 76    Ht 5\' 3"  (1.6 m)    Wt 140 lb 9.6 oz (63.8 kg)    SpO2 97%    BMI 24.91 kg/m     Wt Readings from Last 3 Encounters:  11/06/21 140 lb 9.6 oz (63.8 kg)  09/17/21 134 lb 6.4 oz (61 kg)  09/04/21 135 lb 3.2 oz (61.3 kg)     GEN:  Well nourished, well developed in  no acute distress HEENT: Normal NECK: No JVD; No carotid bruits LYMPHATICS: No lymphadenopathy CARDIAC: RRR, no murmurs, rubs, gallops RESPIRATORY:  Clear to auscultation without rales, wheezing or rhonchi  ABDOMEN: Soft, non-tender, non-distended MUSCULOSKELETAL:  No edema; No deformity  SKIN: Warm and dry NEUROLOGIC:  Alert and oriented x 3 PSYCHIATRIC:  Normal affect   ASSESSMENT:    1. Chronic systolic heart failure (HCC)   2. Mitral valve insufficiency, unspecified etiology   3. Hyperlipidemia LDL goal <70   4. Essential (primary) hypertension    PLAN:    Chronic systolic heart failure:  She was initially diagnosed with heart failure after echocardiogram 03/16/2021, which showed EF 30 to 35%, global hypokinesis, grade 3 diastolic dysfunction, normal RV function, severe left atrial dilatation, moderate to severe mitral regurgitation.  TEE was done on 03/18/2021 which showed moderate to severe MR.  Coronary CTA on 09/05/2021 showed minimal CAD (calcium score 6, 57th percentile), thickened anterior mitral valve leaflet with no prolapse noted, suspected mechanism of MR restricted movement of posterior mitral valve leaflet in systole (IIIB).  Cardiac MRI on 10/21/2021 showed normal LV size with mild systolic dysfunction (EF 45%), no LGE, moderate to severe MR (regurgitant fraction 39%). -Continue losartan 25 mg daily -Continue Toprol-XL 12.5 mg daily -Continue Jardiance 10 mg  daily -Add spironolactone 12.5 mg daily -Schedule in pharmacy heart failure clinic in 2 weeks to continue to titrate heart failure meds -Check BMET  Moderate to severe mild regurgitation: Suspected due to restricted posterior leaflet in systole (IIIB).  Cardiac MRI showed moderate to severe MR. -No murmur on exam, less suspicion for severe MR. Recommend monitoring MR with treatment of her heart failure as above  Hyperlipidemia: Continue atorvastatin 20 mg daily  CVA: On aspirin, statin  Hypertension: On losartan, Toprol-XL.  Appears controlled.  RTC in 3 months   Medication Adjustments/Labs and Tests Ordered: Current medicines are reviewed at length with the patient today.  Concerns regarding medicines are outlined above.  Orders Placed This Encounter  Procedures   Basic metabolic panel   Meds ordered this encounter  Medications   spironolactone (ALDACTONE) 25 MG tablet    Sig: Take 0.5 tablets (12.5 mg total) by mouth daily.    Dispense:  15 tablet    Refill:  3    Patient Instructions  Medication Instructions:  START: SPIRONOLACTONE 12.5mg  ONCE DAILY  *If you need a refill on your cardiac medications before your next appointment, please call your pharmacy*  Lab Work: BMET- TODAY  If you have labs (blood work) drawn today and your tests are completely normal, you will receive your results only by: MyChart Message (if you have MyChart) OR A paper copy in the mail If you have any lab test that is abnormal or we need to change your treatment, we will call you to review the results.  Follow-Up: At [facility], you and your health needs are our priority.  As part of our continuing mission to provide you with exceptional heart care, we have created designated Provider Care Teams.  These Care Teams include your primary Cardiologist (physician) and Advanced Practice Providers (APPs -  Physician Assistants and Nurse Practitioners) who all work together to provide you with the  care you need, when you need it.  We recommend signing up for the patient portal called "MyChart".  Sign up information is provided on this After Visit Summary.  MyChart is used to connect with patients for Virtual Visits (Telemedicine).  Patients are able to view  lab/test results, encounter notes, upcoming appointments, etc.  Non-urgent messages can be sent to your provider as well.   To learn more about what you can do with MyChart, go to [email or web address].    Your next appointment:   3 month(s)  The format for your next appointment:   In Person  Provider:   Parke Poisson, MD    Other Instructions PLEASE SCHEDULE AN APPOINTMENT WITH PHARMD in 2 WEEKS FOR HEART FAILURE MANAGEMENT     Signed, Little Ishikawa, MD  11/07/2021 9:22 AM    [facility]

## 2021-11-06 ENCOUNTER — Ambulatory Visit (INDEPENDENT_AMBULATORY_CARE_PROVIDER_SITE_OTHER): Payer: Medicare Other | Admitting: Cardiology

## 2021-11-06 ENCOUNTER — Other Ambulatory Visit: Payer: Self-pay

## 2021-11-06 ENCOUNTER — Encounter: Payer: Self-pay | Admitting: Cardiology

## 2021-11-06 VITALS — BP 128/84 | HR 76 | Ht 63.0 in | Wt 140.6 lb

## 2021-11-06 DIAGNOSIS — I5022 Chronic systolic (congestive) heart failure: Secondary | ICD-10-CM | POA: Diagnosis not present

## 2021-11-06 DIAGNOSIS — E785 Hyperlipidemia, unspecified: Secondary | ICD-10-CM

## 2021-11-06 DIAGNOSIS — I10 Essential (primary) hypertension: Secondary | ICD-10-CM | POA: Diagnosis not present

## 2021-11-06 DIAGNOSIS — I340 Nonrheumatic mitral (valve) insufficiency: Secondary | ICD-10-CM | POA: Diagnosis not present

## 2021-11-06 LAB — BASIC METABOLIC PANEL
BUN/Creatinine Ratio: 22 (ref 12–28)
BUN: 23 mg/dL (ref 8–27)
CO2: 24 mmol/L (ref 20–29)
Calcium: 9.7 mg/dL (ref 8.7–10.3)
Chloride: 103 mmol/L (ref 96–106)
Creatinine, Ser: 1.03 mg/dL — ABNORMAL HIGH (ref 0.57–1.00)
Glucose: 80 mg/dL (ref 70–99)
Potassium: 4.5 mmol/L (ref 3.5–5.2)
Sodium: 141 mmol/L (ref 134–144)
eGFR: 59 mL/min/{1.73_m2} — ABNORMAL LOW (ref >59)

## 2021-11-06 MED ORDER — SPIRONOLACTONE 25 MG PO TABS: 12.5000 mg | ORAL_TABLET | Freq: Every day | ORAL | 3 refills | Status: DC

## 2021-11-06 NOTE — Patient Instructions (Signed)
Medication Instructions:  START: SPIRONOLACTONE 12.5mg  ONCE DAILY  *If you need a refill on your cardiac medications before your next appointment, please call your pharmacy*  Lab Work: BMET- TODAY  If you have labs (blood work) drawn today and your tests are completely normal, you will receive your results only by: MyChart Message (if you have MyChart) OR A paper copy in the mail If you have any lab test that is abnormal or we need to change your treatment, we will call you to review the results.  Follow-Up: At [facility], you and your health needs are our priority.  As part of our continuing mission to provide you with exceptional heart care, we have created designated Provider Care Teams.  These Care Teams include your primary Cardiologist (physician) and Advanced Practice Providers (APPs -  Physician Assistants and Nurse Practitioners) who all work together to provide you with the care you need, when you need it.  We recommend signing up for the patient portal called "MyChart".  Sign up information is provided on this After Visit Summary.  MyChart is used to connect with patients for Virtual Visits (Telemedicine).  Patients are able to view lab/test results, encounter notes, upcoming appointments, etc.  Non-urgent messages can be sent to your provider as well.   To learn more about what you can do with MyChart, go to [email or web address].    Your next appointment:   3 month(s)  The format for your next appointment:   In Person  Provider:   Parke Poisson, MD    Other Instructions PLEASE SCHEDULE AN APPOINTMENT WITH PHARMD in 2 WEEKS FOR HEART FAILURE MANAGEMENT

## 2021-11-07 ENCOUNTER — Telehealth: Payer: Self-pay | Admitting: *Deleted

## 2021-11-07 DIAGNOSIS — I5020 Unspecified systolic (congestive) heart failure: Secondary | ICD-10-CM

## 2021-11-07 NOTE — Telephone Encounter (Signed)
Left detailed message for patient of lab results and recommendations. Lab orders mailed to the pt

## 2021-11-28 ENCOUNTER — Encounter: Payer: Self-pay | Admitting: *Deleted

## 2021-12-04 ENCOUNTER — Other Ambulatory Visit: Payer: Self-pay | Admitting: Family Medicine

## 2021-12-04 DIAGNOSIS — I5020 Unspecified systolic (congestive) heart failure: Secondary | ICD-10-CM

## 2021-12-05 ENCOUNTER — Other Ambulatory Visit: Payer: Self-pay

## 2021-12-05 ENCOUNTER — Ambulatory Visit (INDEPENDENT_AMBULATORY_CARE_PROVIDER_SITE_OTHER): Payer: Medicare Other | Admitting: Pharmacist

## 2021-12-05 VITALS — BP 122/80 | HR 68 | Resp 17 | Ht 63.0 in | Wt 134.6 lb

## 2021-12-05 DIAGNOSIS — I5020 Unspecified systolic (congestive) heart failure: Secondary | ICD-10-CM | POA: Diagnosis not present

## 2021-12-05 LAB — BASIC METABOLIC PANEL
BUN/Creatinine Ratio: 28 (ref 12–28)
BUN: 25 mg/dL (ref 8–27)
CO2: 23 mmol/L (ref 20–29)
Calcium: 10.2 mg/dL (ref 8.7–10.3)
Chloride: 102 mmol/L (ref 96–106)
Creatinine, Ser: 0.88 mg/dL (ref 0.57–1.00)
Glucose: 81 mg/dL (ref 70–99)
Potassium: 4.5 mmol/L (ref 3.5–5.2)
Sodium: 138 mmol/L (ref 134–144)
eGFR: 71 mL/min/{1.73_m2} (ref >59)

## 2021-12-05 NOTE — Patient Instructions (Addendum)
It was nice meeting you today!  We would like to keep your blood pressure less than 130/80  Please continue your: Metoprolol 12.5mg  daily Spironolactone 12.5mg  daily Losartan 25mg  daily Jardiance 10mg  daily  Continue to check your blood pressure at home.  Start checking your weight at home and give [place] a call if you gain more than three pounds overnight or 5 pounds in a week  Continue to watch how much salt you are eating  We will check your lab work today and contact you with the results  Please call with any questions!  Karren Cobble, PharmD, BCACP, [place], [place], [place], [place], [address] Phone: [phone], Fax: [phone]

## 2021-12-05 NOTE — Progress Notes (Signed)
Patient ID: Leah Pierce                 DOB: December 25, 1950                      MRN: [id number]     HPI: Leah Pierce is a 71 y.o. female referred by Dr. Bjorn Pippin to pharmacy clinic for HF medication management. PMH is significant for CVA, HFrEF, HTN, HLD, and pre DM. Most recent LVEF 35-40% on 08/06/21.  Patient presents today in good spirits. Saw Dr Bjorn Pippin 2 weeks ago and was stared on spironolactone 12.5mg . Typically followed by Dr Jacques Navy.  Reports she feels great. Denies chest pain, dizziness, shortness of breath, or swelling.    Works 20-25 hours per week at [employer] because it gets her out of the house. Wears compression stockings when she works because she is on her feet most of the day. Is able to complete all ADLs.  Appetite is strong.  Is checking BP at home but is not weighing herself.  Home BP readings:  1/18: 122/78, 61 1/16: 140/88, 71 1/13: 133/78, 62 1/12: 124/88, 70 1/10: 148/86, 66 1/9: 126/79, 63 1/8: 131/64, 66  Due for repeat BMP since starting spiro.  Current CHF meds:  Spironolactone 12.5mg  daily Losartan 25mg  daily Jardiance 10mg  daily Toprol XL 12.5mg  daily  BP goal: <130/80   Wt Readings from Last 3 Encounters:  11/06/21 140 lb 9.6 oz (63.8 kg)  09/17/21 134 lb 6.4 oz (61 kg)  09/04/21 135 lb 3.2 oz (61.3 kg)   BP Readings from Last 3 Encounters:  11/06/21 128/84  09/17/21 138/78  09/05/21 (!) 125/94   Pulse Readings from Last 3 Encounters:  11/06/21 76  09/17/21 78  09/05/21 69    Renal function: CrCl cannot be calculated (Patient's most recent lab result is older than the maximum 21 days allowed.).  Past Medical History:  Diagnosis Date   Allergy    Arthritis    Asthma    hx of   Chicken pox    Heart murmur    Migraines    Stroke [facility])     Current Outpatient Medications on File Prior to Visit  Medication Sig Dispense Refill   albuterol (VENTOLIN HFA) 108 (90 Base) MCG/ACT inhaler Inhale 2 puffs  into the lungs every 6 (six) hours as needed for wheezing or shortness of breath. (Patient not taking: Reported on 11/06/2021) 18 g 3   alendronate (FOSAMAX) 70 MG tablet Take 1 tablet (70 mg total) by mouth every 7 (seven) days. Take with a full glass of water on an empty stomach. (Patient not taking: Reported on 11/06/2021) 12 tablet 3   aspirin EC 81 MG tablet Take 1 tablet (81 mg total) by mouth daily. Swallow whole. 30 tablet 11   atorvastatin (LIPITOR) 20 MG tablet Take 1 tablet (20 mg total) by mouth daily. 90 tablet 3   cholecalciferol (VITAMIN D3) 25 MCG (1000 UNIT) tablet Take 1,000 Units by mouth daily.     fluticasone (FLOVENT HFA) 110 MCG/ACT inhaler Inhale 1 puff into the lungs in the morning and at bedtime. (Patient not taking: Reported on 11/06/2021) 1 Inhaler 3   JARDIANCE 10 MG TABS tablet TAKE 1 TABLET BY MOUTH DAILY BEFORE BREAKFAST. 30 tablet 3   losartan (COZAAR) 25 MG tablet Take 1 tablet (25 mg total) by mouth daily. 90 tablet 2   metoprolol succinate (TOPROL-XL) 25 MG 24 hr tablet Take 0.5 tablets (12.5  mg total) by mouth daily. Take with or immediately following a meal. 45 tablet 3   Misc Natural Products (OSTEO BI-FLEX ADV JOINT SHIELD) TABS Take 1 tablet by mouth daily.     OVER THE COUNTER MEDICATION Take 1 tablet by mouth daily. NEURIVA     Spacer/Aero-Holding Chambers (AEROCHAMBER PLUS) inhaler Use as instructed to use with inahaler. 1 each 1   spironolactone (ALDACTONE) 25 MG tablet Take 0.5 tablets (12.5 mg total) by mouth daily. 15 tablet 3   vitamin C (ASCORBIC ACID) 500 MG tablet Take 500 mg by mouth daily.     vitamin E 200 UNIT capsule Take 200 Units by mouth daily.     No current facility-administered medications on file prior to visit.    Allergies  Allergen Reactions   Tetracyclines & Related     Rash on hands     Assessment/Plan:  1. CHF -  Patient BP in room today 122/80 which is at goal of <130/80.  Home readings variable and occasionally at  goal but variance likely due to patient taking BP at different times of the day.  No adverse effects to any CHF meds.  Advised to continue to check BP at home and to begin weighing herself in the morning and to call for weight gain of >3# overnight of >5# in a week. Will not increase any medications at this time due to BP at goal and concern for BP dropping rapidly.  Checking BMP today. Will consider increase of spiro or change losartan to Entresto.  Recheck in 4 weeks.  Continue: Spironolactone 12.5mg  daily Losartan 25mg  daily Jardiance 10mg  daily Toprol XL 12.5mg  daily Check BMP Recheck in clinic in 4 weeks  , PharmD, BCACP, CDCES, CPP [address], [address] [place], [address], [place] Phone: [phone], Fax: [phone]

## 2021-12-06 ENCOUNTER — Encounter: Payer: Self-pay | Admitting: *Deleted

## 2021-12-10 ENCOUNTER — Other Ambulatory Visit: Payer: Self-pay

## 2021-12-10 ENCOUNTER — Encounter: Payer: Self-pay | Admitting: Emergency Medicine

## 2021-12-10 ENCOUNTER — Ambulatory Visit
Admission: EM | Admit: 2021-12-10 | Discharge: 2021-12-10 | Disposition: A | Discharge status: Home / Self Care | Payer: Medicare Other

## 2021-12-10 DIAGNOSIS — M79602 Pain in left arm: Secondary | ICD-10-CM

## 2021-12-10 NOTE — Discharge Instructions (Signed)
Please call provided contact formation for orthopedist for further evaluation and management.

## 2021-12-10 NOTE — ED Provider Notes (Signed)
[facility]    CSN: [id number] Arrival date & time: 12/10/21  1132      History   Chief Complaint Chief Complaint  Patient presents with   Shoulder Pain         HPI Leah Pierce is a 71 y.o. female.   Patient presents with left shoulder and left upper arm pain that has been present for approximately 3 days.  Denies any apparent injury.  Patient reports that she woke up with pain.  Denies any chronic history of left shoulder pain.  No numbness or tingling present to extremity.  Pain is exacerbated by movement.  Patient has not taken any medications to help alleviate pain.   Shoulder Pain  Past Medical History:  Diagnosis Date   Allergy    Arthritis    Asthma    hx of   Chicken pox    Heart murmur    Migraines    Stroke [facility])     Patient Active Problem List   Diagnosis Date Noted   Carotid artery stenosis 08/12/2021   Essential (primary) hypertension 06/10/2021   HFrEF (heart failure with reduced ejection fraction) (HCC) 04/08/2021   Moderate to severe mitral regurgitation 04/08/2021   CVA (cerebral vascular accident) [facility])    Stroke determined by clinical assessment (HCC) 03/15/2021   Hyperlipidemia LDL goal <70 08/08/2020   Prediabetes 08/08/2020   Osteoporosis 06/10/2018   Allergic rhinitis 09/23/2017   Asthma, intermittent, uncomplicated 09/23/2017    Past Surgical History:  Procedure Laterality Date   BUBBLE STUDY  03/18/2021   Procedure: BUBBLE STUDY;  Surgeon: Lewayne Bunting, MD;  Location: [facility] ENDOSCOPY;  Service: Cardiovascular;;   CESAREAN SECTION     COLONOSCOPY     TEE WITHOUT CARDIOVERSION N/A 03/18/2021   Procedure: TRANSESOPHAGEAL ECHOCARDIOGRAM (TEE);  Surgeon: Lewayne Bunting, MD;  Location: [facility] ENDOSCOPY;  Service: Cardiovascular;  Laterality: N/A;   TONSILLECTOMY  1959    OB History   No obstetric history on file.      Home Medications    Prior to Admission medications   Medication Sig Start Date End Date  Taking? Authorizing Provider  albuterol (VENTOLIN HFA) 108 (90 Base) MCG/ACT inhaler Inhale 2 puffs into the lungs every 6 (six) hours as needed for wheezing or shortness of breath. 02/06/21   [place], Betty G, MD  alendronate (FOSAMAX) 70 MG tablet Take 1 tablet (70 mg total) by mouth every 7 (seven) days. Take with a full glass of water on an empty stomach. 08/11/20   [place], Betty G, MD  aspirin EC 81 MG tablet Take 1 tablet (81 mg total) by mouth daily. Swallow whole. 09/04/21   Ihor Austin, NP  atorvastatin (LIPITOR) 20 MG tablet Take 1 tablet (20 mg total) by mouth daily. 08/27/21   Parke Poisson, MD  cholecalciferol (VITAMIN D3) 25 MCG (1000 UNIT) tablet Take 1,000 Units by mouth daily.    [provider]  fluticasone (FLOVENT HFA) 110 MCG/ACT inhaler Inhale 1 puff into the lungs in the morning and at bedtime. 05/04/20   [place], Betty G, MD  JARDIANCE 10 MG TABS tablet TAKE 1 TABLET BY MOUTH DAILY BEFORE BREAKFAST. 12/04/21   [place], Betty G, MD  losartan (COZAAR) 25 MG tablet Take 1 tablet (25 mg total) by mouth daily. 08/12/21   [place], Betty G, MD  metoprolol succinate (TOPROL-XL) 25 MG 24 hr tablet Take 0.5 tablets (12.5 mg total) by mouth daily. Take with or immediately following a meal. 08/27/21  Parke Poisson, MD  Misc Natural Products (OSTEO BI-FLEX ADV JOINT SHIELD) TABS Take 1 tablet by mouth daily.    [provider]  OVER THE COUNTER MEDICATION Take 1 tablet by mouth daily. NEURIVA    [provider]  Spacer/Aero-Holding Chambers (AEROCHAMBER PLUS) inhaler Use as instructed to use with inahaler. 05/04/20   [place], Betty G, MD  spironolactone (ALDACTONE) 25 MG tablet Take 0.5 tablets (12.5 mg total) by mouth daily. 11/06/21   Little Ishikawa, MD  vitamin C (ASCORBIC ACID) 500 MG tablet Take 500 mg by mouth daily.    [provider]  vitamin E 200 UNIT capsule Take 200 Units by mouth daily.    [provider]    Family  History Family History  Problem Relation Age of Onset   Arthritis Mother    Stroke Mother    Hypertension Mother    Diabetes Mother    Colon polyps Mother    Arthritis Father    Cancer Father        Prostate   Cancer Brother        prostate   Cancer Brother        Pancreas   Colon cancer Neg Hx    Esophageal cancer Neg Hx    Rectal cancer Neg Hx    Stomach cancer Neg Hx     Social History Social History   Tobacco Use   Smoking status: Never   Smokeless tobacco: Never  Vaping Use   Vaping Use: Never used  Substance Use Topics   Alcohol use: Yes    Comment: occasionally   Drug use: Yes    Frequency: 1.0 times per week    Types: Marijuana     Allergies   Tetracyclines & related   Review of Systems Review of Systems Per HPI  Physical Exam Triage Vital Signs ED Triage Vitals  Enc Vitals Group     BP 12/10/21 1157 119/77     Pulse Rate 12/10/21 1157 71     Resp 12/10/21 1157 18     Temp 12/10/21 1157 98.1 F (36.7 C)     Temp Source 12/10/21 1157 Oral     SpO2 12/10/21 1157 97 %     Weight --      Height --      Head Circumference --      Peak Flow --      Pain Score 12/10/21 1158 6     Pain Loc --      Pain Edu? --      Excl. in GC? --    No data found.  Updated Vital Signs BP 119/77 (BP Location: Right Arm)    Pulse 71    Temp 98.1 F (36.7 C) (Oral)    Resp 18    SpO2 97%   Visual Acuity Right Eye Distance:   Left Eye Distance:   Bilateral Distance:    Right Eye Near:   Left Eye Near:    Bilateral Near:     Physical Exam Constitutional:      General: She is not in acute distress.    Appearance: Normal appearance. She is not toxic-appearing or diaphoretic.  HENT:     Head: Normocephalic and atraumatic.  Eyes:     Extraocular Movements: Extraocular movements intact.     Conjunctiva/sclera: Conjunctivae normal.  Pulmonary:     Effort: Pulmonary effort is normal.  Musculoskeletal:     Right shoulder: Normal.  Left shoulder:  Normal.     Comments: No tenderness to palpation throughout shoulder.  No erythema, lacerations, abrasions, discoloration noted.  Patient has full range of motion of shoulder.  Tenderness to palpation to left upper arm and biceps region.  Pain with range of motion.  Grip strength 5/5.  Neurovascular intact.  Neurological:     General: No focal deficit present.     Mental Status: She is alert and oriented to person, place, and time. Mental status is at baseline.  Psychiatric:        Mood and Affect: Mood normal.        Behavior: Behavior normal.        Thought Content: Thought content normal.        Judgment: Judgment normal.     UC Treatments / Results  Labs (all labs ordered are listed, but only abnormal results are displayed) Labs Reviewed - No data to display  EKG   Radiology No results found.  Procedures Procedures (including critical care time)  Medications Ordered in UC Medications - No data to display  Initial Impression / Assessment and Plan / UC Course  I have reviewed the triage vital signs and the nursing notes.  Pertinent labs & imaging results that were available during my care of the patient were reviewed by me and considered in my medical decision making (see chart for details).     Physical exam is consistent with muscular strain.  Do not think that imaging is necessary, although patient was offered x-ray imaging and declined.  Patient will need to follow-up with orthopedist for further evaluation and management.  No suspicion for DVT.  Limited options on pain management given patient's history of CHF with reduced ejection fraction.  Discussed supportive care and pain management with patient.  Patient may alternate ice and heat application to affected area of pain.  Provided patient with contact information for orthopedist.  Discussed return precautions.  Patient verbalized understanding and was agreeable with plan. Final Clinical Impressions(s) / UC Diagnoses    Final diagnoses:  Left arm pain     Discharge Instructions      Please call provided contact formation for orthopedist for further evaluation and management.    ED Prescriptions   None    PDMP not reviewed this encounter.   Gustavus Bryant, [place] 12/10/21 1210

## 2021-12-10 NOTE — ED Triage Notes (Signed)
Pt here for left shoulder and upper arm pain x 3 days that is worse with movement; pt denies injury

## 2021-12-19 ENCOUNTER — Telehealth: Payer: Self-pay | Admitting: Family Medicine

## 2021-12-19 NOTE — Chronic Care Management (AMB) (Signed)
°  Chronic Care Management   Outreach Note  12/19/2021 Name: Leah Pierce MRN: [id number] DOB: Jan 09, 1951  Referred by: [place], Betty G, MD Reason for referral : No chief complaint on file.   An unsuccessful telephone outreach was attempted today. The patient was referred to the pharmacist for assistance with care management and care coordination.   Follow Up Plan:   Tatjana Dellinger [employer]stream Scheduler

## 2021-12-24 DIAGNOSIS — Z961 Presence of intraocular lens: Secondary | ICD-10-CM | POA: Diagnosis not present

## 2021-12-24 DIAGNOSIS — H26493 Other secondary cataract, bilateral: Secondary | ICD-10-CM | POA: Diagnosis not present

## 2021-12-24 DIAGNOSIS — H5203 Hypermetropia, bilateral: Secondary | ICD-10-CM | POA: Diagnosis not present

## 2021-12-24 DIAGNOSIS — H31012 Macula scars of posterior pole (postinflammatory) (post-traumatic), left eye: Secondary | ICD-10-CM | POA: Diagnosis not present

## 2021-12-24 DIAGNOSIS — H52223 Regular astigmatism, bilateral: Secondary | ICD-10-CM | POA: Diagnosis not present

## 2021-12-24 DIAGNOSIS — H524 Presbyopia: Secondary | ICD-10-CM | POA: Diagnosis not present

## 2021-12-27 ENCOUNTER — Other Ambulatory Visit: Payer: Self-pay

## 2021-12-27 DIAGNOSIS — I5020 Unspecified systolic (congestive) heart failure: Secondary | ICD-10-CM | POA: Diagnosis not present

## 2021-12-28 LAB — BASIC METABOLIC PANEL
BUN/Creatinine Ratio: 22 (ref 12–28)
BUN: 18 mg/dL (ref 8–27)
CO2: 21 mmol/L (ref 20–29)
Calcium: 10.2 mg/dL (ref 8.7–10.3)
Chloride: 104 mmol/L (ref 96–106)
Creatinine, Ser: 0.82 mg/dL (ref 0.57–1.00)
Glucose: 93 mg/dL (ref 70–99)
Potassium: 4.5 mmol/L (ref 3.5–5.2)
Sodium: 141 mmol/L (ref 134–144)
eGFR: 77 mL/min/{1.73_m2} (ref >59)

## 2022-01-02 ENCOUNTER — Encounter: Payer: Self-pay | Admitting: Pharmacist Clinician (PhC)/ Clinical Pharmacy Specialist

## 2022-01-02 ENCOUNTER — Ambulatory Visit (INDEPENDENT_AMBULATORY_CARE_PROVIDER_SITE_OTHER): Payer: Medicare Other | Admitting: Pharmacist Clinician (PhC)/ Clinical Pharmacy Specialist

## 2022-01-02 ENCOUNTER — Other Ambulatory Visit: Payer: Self-pay

## 2022-01-02 DIAGNOSIS — I5020 Unspecified systolic (congestive) heart failure: Secondary | ICD-10-CM

## 2022-01-02 NOTE — Progress Notes (Deleted)
Patient ID: Leah Pierce                 DOB: 12-09-1950                      MRN: [id number]     HPI: Leah Pierce is a 71 y.o. female referred by Dr. Bjorn Pierce to pharmacy clinic for HF medication management. PMH is significant for CVA, HFrEF, HTN, HLD, and pre DM. Most recent LVEF 35-40% on 08/06/21.  At last visit with Dr Leah Pierce, patient was started on spiro 12.5mg  and follow up lab work was normal.    Today she returns to pharmacy clinic for further medication titration. At last visit with MD ***. Symptomatically, she is feeling ***, *** dizziness, lightheadedness, and fatigue. *** chest pain or palpitations. Feels SOB when ***. Able to complete all ADLs. Activity level ***. She *** checks her weight at home (normal range *** - *** lbs). *** LEE, PND, or orthopnea. Appetite has been ***. She *** adheres to a low-salt diet.  Current CHF meds:  Previously tried:  BP goal:   Family History:   Social History:   Diet:   Exercise:   Home BP readings:   Wt Readings from Last 3 Encounters:  12/05/21 134 lb 9.6 oz (61.1 kg)  11/06/21 140 lb 9.6 oz (63.8 kg)  09/17/21 134 lb 6.4 oz (61 kg)   BP Readings from Last 3 Encounters:  12/10/21 119/77  12/05/21 122/80  11/06/21 128/84   Pulse Readings from Last 3 Encounters:  12/10/21 71  12/05/21 68  11/06/21 76    Renal function: CrCl cannot be calculated (Unknown ideal weight.).  Past Medical History:  Diagnosis Date   Allergy    Arthritis    Asthma    hx of   Chicken pox    Heart murmur    Migraines    Stroke [facility])     Current Outpatient Medications on File Prior to Visit  Medication Sig Dispense Refill   albuterol (VENTOLIN HFA) 108 (90 Base) MCG/ACT inhaler Inhale 2 puffs into the lungs every 6 (six) hours as needed for wheezing or shortness of breath. 18 g 3   alendronate (FOSAMAX) 70 MG tablet Take 1 tablet (70 mg total) by mouth every 7 (seven) days. Take with a full glass of water on an empty  stomach. 12 tablet 3   aspirin EC 81 MG tablet Take 1 tablet (81 mg total) by mouth daily. Swallow whole. 30 tablet 11   atorvastatin (LIPITOR) 20 MG tablet Take 1 tablet (20 mg total) by mouth daily. 90 tablet 3   cholecalciferol (VITAMIN D3) 25 MCG (1000 UNIT) tablet Take 1,000 Units by mouth daily.     fluticasone (FLOVENT HFA) 110 MCG/ACT inhaler Inhale 1 puff into the lungs in the morning and at bedtime. 1 Inhaler 3   JARDIANCE 10 MG TABS tablet TAKE 1 TABLET BY MOUTH DAILY BEFORE BREAKFAST. 30 tablet 3   losartan (COZAAR) 25 MG tablet Take 1 tablet (25 mg total) by mouth daily. 90 tablet 2   metoprolol succinate (TOPROL-XL) 25 MG 24 hr tablet Take 0.5 tablets (12.5 mg total) by mouth daily. Take with or immediately following a meal. 45 tablet 3   Misc Natural Products (OSTEO BI-FLEX ADV JOINT SHIELD) TABS Take 1 tablet by mouth daily.     OVER THE COUNTER MEDICATION Take 1 tablet by mouth daily. NEURIVA     Spacer/Aero-Holding Chambers (AEROCHAMBER PLUS) inhaler  Use as instructed to use with inahaler. 1 each 1   spironolactone (ALDACTONE) 25 MG tablet Take 0.5 tablets (12.5 mg total) by mouth daily. 15 tablet 3   vitamin C (ASCORBIC ACID) 500 MG tablet Take 500 mg by mouth daily.     vitamin E 200 UNIT capsule Take 200 Units by mouth daily.     No current facility-administered medications on file prior to visit.    Allergies  Allergen Reactions   Tetracyclines & Related     Rash on hands     Assessment/Plan:  1. CHF -

## 2022-01-02 NOTE — Patient Instructions (Signed)
Return for a a follow up appointment with Dr. Jacques Navy in April  Check your blood pressure at home daily and keep record of the readings.  Take your BP meds as follows:  Continue with your current medications  Bring all of your meds, your BP cuff and your record of home blood pressures to your next appointment.  Exercise as youre able, try to walk approximately 30 minutes per day.  Keep salt intake to a minimum, especially watch canned and prepared boxed foods.  Eat more fresh fruits and vegetables and fewer canned items.  Avoid eating in fast food restaurants.    HOW TO TAKE YOUR BLOOD PRESSURE: Rest 5 minutes before taking your blood pressure.  Dont smoke or drink caffeinated beverages for at least 30 minutes before. Take your blood pressure before (not after) you eat. Sit comfortably with your back supported and both feet on the floor (dont cross your legs). Elevate your arm to heart level on a table or a desk. Use the proper sized cuff. It should fit smoothly and snugly around your bare upper arm. There should be enough room to slip a fingertip under the cuff. The bottom edge of the cuff should be 1 inch above the crease of the elbow. Ideally, take 3 measurements at one sitting and record the average.

## 2022-01-02 NOTE — Progress Notes (Signed)
01/03/2022 Leah Pierce 11-09-1951 [id number]   HPI: Leah Pierce is a 71 y.o. female referred by Dr. Bjorn Pippin to pharmacy clinic for HF medication management. Most recent LVEF 35-40% on 08/06/21.  Dr. Bjorn Pippin saw her in December for Dr. Jacques Navy, and added spironolactone 12.5 mg to her regimen.  She was then seen in [facility] by Dr. Delma Officer, who made no changes to her medications.  A metabolic panel drawn that day was WNL for electrolytes and kidney function.  At that visit she had 7 blood pressure readings which averaged 132/80.     Today she returns for a follow up appointment.  Has been feeling well, feels as there are no restrictions on her ability to get around.  Has no concerns about her medications at this time.    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  Intolerances: no cardiac medication intolerances  Labs: 12/05/21:  Na 138, K 4.5, Glu 81, BUN 25, SCr 0.88 GFR 71  12/27/21: Na 141, K 4.5, Glu 93, BUN 18, SCr 0.82, GFR 77   Wt Readings from Last 3 Encounters:  01/02/22 137 lb 9.6 oz (62.4 kg)  12/05/21 134 lb 9.6 oz (61.1 kg)  11/06/21 140 lb 9.6 oz (63.8 kg)   BP Readings from Last 3 Encounters:  01/02/22 116/68  12/10/21 119/77  12/05/21 122/80   Pulse Readings from Last 3 Encounters:  01/02/22 66  12/10/21 71  12/05/21 68    Current  Outpatient Medications  Medication Sig Dispense Refill   albuterol (VENTOLIN HFA) 108 (90 Base) MCG/ACT inhaler Inhale 2 puffs into the lungs every 6 (six) hours as needed for wheezing or shortness of breath. 18 g 3   alendronate (FOSAMAX) 70 MG tablet Take 1 tablet (70 mg total) by mouth every 7 (seven) days. Take with a full glass of water on an empty stomach. 12 tablet 3   aspirin EC 81 MG tablet Take 1 tablet (81 mg total) by mouth daily. Swallow whole. 30 tablet 11   atorvastatin (LIPITOR) 20 MG tablet Take 1 tablet (20 mg total) by mouth daily. 90 tablet 3   cholecalciferol (VITAMIN D3) 25 MCG (1000 UNIT) tablet Take 1,000 Units by mouth daily.     fluticasone (FLOVENT HFA) 110 MCG/ACT inhaler Inhale 1 puff into the lungs in the morning and at bedtime. 1 Inhaler 3   JARDIANCE 10 MG TABS tablet TAKE 1 TABLET BY MOUTH DAILY BEFORE BREAKFAST. 30 tablet 3   losartan (COZAAR) 25 MG tablet Take 1 tablet (25 mg total) by mouth daily. 90 tablet 2   metoprolol succinate (TOPROL-XL) 25 MG 24 hr tablet Take 0.5 tablets (12.5 mg total) by mouth daily. Take with or immediately following a meal. 45 tablet 3   Misc Natural Products (OSTEO BI-FLEX ADV JOINT SHIELD) TABS  Take 1 tablet by mouth daily.     OVER THE COUNTER MEDICATION Take 1 tablet by mouth daily. NEURIVA     Spacer/Aero-Holding Chambers (AEROCHAMBER PLUS) inhaler Use as instructed to use with inahaler. 1 each 1   spironolactone (ALDACTONE) 25 MG tablet Take 0.5 tablets (12.5 mg total) by mouth daily. 15 tablet 3   vitamin C (ASCORBIC ACID) 500 MG tablet Take 500 mg by mouth daily.     vitamin E 200 UNIT capsule Take 200 Units by mouth daily.     No current facility-administered medications for this visit.    Allergies  Allergen Reactions   Tetracyclines & Related     Rash on hands    Past Medical History:  Diagnosis Date   Allergy    Arthritis    Asthma    hx of   Chicken pox    Heart murmur    Migraines    Stroke (HCC)      Blood pressure 116/68, pulse 66, resp. rate 17, height 5\' 3"  (1.6 m), weight 137 lb 9.6 oz (62.4 kg), SpO2 98 %.  HFrEF (heart failure with reduced ejection fraction) (HCC) Patient with HFrEF, currently on GDMT.  Talked about option of switching losartan to [facility], however patient is stable and would prefer to not make any changes at this time.  Also has some concern if she were to be on 2 branded medications, as she would go into the Medicare Coverage gap early in the year.  Will have her continue with current medications and routine home BP monitoring.  Patient scheduled to see [facility] in May.  We can follow up as needed.    June PharmD CPP [facility] [address] [place], [place] [phone]

## 2022-01-03 ENCOUNTER — Encounter: Payer: Self-pay | Admitting: Pharmacist Clinician (PhC)/ Clinical Pharmacy Specialist

## 2022-01-03 DIAGNOSIS — H40013 Open angle with borderline findings, low risk, bilateral: Secondary | ICD-10-CM | POA: Diagnosis not present

## 2022-01-03 DIAGNOSIS — H26491 Other secondary cataract, right eye: Secondary | ICD-10-CM | POA: Diagnosis not present

## 2022-01-03 DIAGNOSIS — H26493 Other secondary cataract, bilateral: Secondary | ICD-10-CM | POA: Diagnosis not present

## 2022-01-03 NOTE — Assessment & Plan Note (Signed)
Patient with HFrEF, currently on GDMT.  Talked about option of switching losartan to [facility], however patient is stable and would prefer to not make any changes at this time.  Also has some concern if she were to be on 2 branded medications, as she would go into the Medicare Coverage gap early in the year.  Will have her continue with current medications and routine home BP monitoring.  Patient scheduled to see Dr. Jacques Navy in May.  We can follow up as needed.

## 2022-01-17 DIAGNOSIS — H26493 Other secondary cataract, bilateral: Secondary | ICD-10-CM | POA: Diagnosis not present

## 2022-01-17 DIAGNOSIS — H26492 Other secondary cataract, left eye: Secondary | ICD-10-CM | POA: Diagnosis not present

## 2022-01-28 ENCOUNTER — Telehealth: Payer: Self-pay | Admitting: Internal Medicine

## 2022-01-28 NOTE — Telephone Encounter (Signed)
? ?  Patient Name: Leah Pierce  ?DOB: Mar 19, 1951 ?MRN: [id number] ? ?Primary Cardiologist: Parke Poisson, MD ? ?Chart reviewed as part of pre-operative protocol coverage.  ? ?Simple (1-2 teeth) dental extractions and dental cleaning are considered low risk procedures per guidelines and generally do not require any specific cardiac clearance. It is also generally accepted that for simple extractions and dental cleanings, there is no need to interrupt blood thinner therapy. ? ?SBE prophylaxis is not required for the patient from a cardiac standpoint. ? ?I will route this recommendation to the requesting party via Epic fax function and remove from pre-op pool. ? ?Please call with questions. ? ?Azalee Course, [place] ?01/28/2022, 10:37 AM ? ?

## 2022-01-28 NOTE — Telephone Encounter (Signed)
? ?  Pre-operative Risk Assessment  ?  ?Patient Name: Leah Pierce  ?DOB: Oct 06, 1951 ?MRN: [id number]  ? ? ? ?Request for Surgical Clearance   ? ?Procedure:   DENTAL CLEANING ?DENTAL  ?Date of Surgery:  Clearance 01/28/22                              ?   ?Surgeon:   ?Surgeon's Group or Practice Name:  [facility]  ?Phone number:  [phone] ?Fax number:  [phone] ?  ?Type of Clearance Requested:   ?- Medical  ?  ?Type of Anesthesia:  None  ?  ?Additional requests/questions:   ? ?Signed, ?Julaine Hua   ?01/28/2022, 10:25 AM  ? ?

## 2022-01-28 NOTE — Telephone Encounter (Signed)
? ?  Pre-operative Risk Assessment  ?  ?Patient Name: Leah Pierce  ?DOB: 1951/05/21 ?MRN: [id number]  ? ?  ? ?Request for Surgical Clearance   ? ?Procedure:   Dental Cleaning  ? ?Date of Surgery:  Clearance 01/28/22                              ?   ?Surgeon:   Dr. Debbora Presto ?Surgeon's Group or Practice Name:  [facility]   ?Phone number:  [phone] ?Fax number:    ?  ?Type of Clearance Requested:   ?- Medical  ?  ?Type of Anesthesia:  None  ?  ?Additional requests/questions:  Does this patient need antibiotics? ? ?Signed, ?Filomena Jungling   ?01/28/2022, 10:02 AM  ? ?

## 2022-02-02 ENCOUNTER — Other Ambulatory Visit: Payer: Self-pay | Admitting: Cardiology

## 2022-03-10 ENCOUNTER — Ambulatory Visit (INDEPENDENT_AMBULATORY_CARE_PROVIDER_SITE_OTHER): Payer: Medicare Other | Admitting: Internal Medicine

## 2022-03-10 ENCOUNTER — Encounter: Payer: Self-pay | Admitting: Internal Medicine

## 2022-03-10 VITALS — BP 112/60 | HR 65 | Ht 63.0 in | Wt 135.6 lb

## 2022-03-10 DIAGNOSIS — I63132 Cerebral infarction due to embolism of left carotid artery: Secondary | ICD-10-CM | POA: Diagnosis not present

## 2022-03-10 DIAGNOSIS — I5020 Unspecified systolic (congestive) heart failure: Secondary | ICD-10-CM | POA: Diagnosis not present

## 2022-03-10 DIAGNOSIS — I5042 Chronic combined systolic (congestive) and diastolic (congestive) heart failure: Secondary | ICD-10-CM

## 2022-03-10 DIAGNOSIS — I6523 Occlusion and stenosis of bilateral carotid arteries: Secondary | ICD-10-CM | POA: Diagnosis not present

## 2022-03-10 DIAGNOSIS — I340 Nonrheumatic mitral (valve) insufficiency: Secondary | ICD-10-CM | POA: Diagnosis not present

## 2022-03-10 DIAGNOSIS — I10 Essential (primary) hypertension: Secondary | ICD-10-CM

## 2022-03-10 DIAGNOSIS — E785 Hyperlipidemia, unspecified: Secondary | ICD-10-CM

## 2022-03-10 DIAGNOSIS — Z79899 Other long term (current) drug therapy: Secondary | ICD-10-CM | POA: Diagnosis not present

## 2022-03-10 DIAGNOSIS — I5022 Chronic systolic (congestive) heart failure: Secondary | ICD-10-CM

## 2022-03-10 NOTE — Progress Notes (Signed)
 Cardiology Office Note:    Date:  03/10/2022   ID:  Leah Pierce, DOB 1951-05-09, MRN [id number]  PCP:  [place], Betty G, MD  Cardiologist:  Euell Herrlich, MD  Electrophysiologist:  None   Referring MD: [place], Betty G, MD   Chief Complaint/Reason for Referral: MV disease, stroke, HFrEF  History of Present Illness:    Leah Pierce is a 71 y.o. female with a history of acute systolic HF, mitral valve regurgitation with abnormal appearing mitral valve, CVA, HLD, prediabetes, asthma. Presents for follow up.   I initially met the patient when she was admitted for stroke and was noted to have a new cardiomyopathy and abnormal mitral valve.  She had a viral infection in the first part of April 2022, preceding stroke and HF diagnosis. Unclear if CM represents viral etiology.   Reviewed TTE from 08/06/21 and TEE from 03/18/21. MR is at least moderate severe. Mechanism appears mixed with some primary leaflet dysfunction of the anterior leaflet, and secondary MR with leaflet restriction, quite notable with the secondary chords attaching to the anterior leaflet. EF appears 40% on most recent TTE.   Historically she reported occasional paroxysms of shortness of breath for which she used her rescue inhaler prescribed by Dr. [place].  She does have a diagnosis of asthma since childhood, but she was not having to frequently use her inhaler.  She was NYHA class I symptoms for the most part, occasionally class II.   Family history of heart disease.  It did sound like her brother had a cardiomyopathy for which she saw Dr. Julane Ny.  She noted a strong family history of heart disease and wondered if some of her heart trouble could be genetic. We discussed in great detail the natural history of cardiomyopathy and the work-up involved, including initially ruling out ischemic heart disease and subsequently pursuing a valvular, postviral, or genetic cardiomyopathic process.  Office Visit  09/17/2021: She was feeling well overall with NYHA class I symptoms.  Continued to work at [employer] and stay active.  Coronary CTA performed to evaluate nature of cardiomyopathy minimal coronary artery disease.  Due to technical difficulties the mitral valve was incompletely assessed on cardiac CT despite my request for retrospective gating.  Nevertheless, report suggests and her mitral leaflet is thickened and possibly represents prior inflammatory pathology, mechanism of MR felt to be tethering and restricted movement of the posterior mitral leaflet in systole, Carpentier type IIIb.  We discussed that for full evaluation of the degree of mitral valve regurgitation, cardiac MRI may be the next best step.  This will help us  understand the nature of her cardiomyopathy further given that it is nonischemic and if no cardiomyopathic process identified, we may be able to attribute her reduced ejection fraction to mitral valve regurgitation.  I have requested quantitation of the mitral valve regurgitation on MRI.  She is in agreement and willing to proceed.  We reviewed that if mitral valve regurgitation is significant enough to cause her cardiomyopathy, the next step may be recommending that she have mitral valve replacement or repair, which may necessitate referral to [facility].  Follow-up today: She saw Dr. Alda Amas 10/2021, and spironolactone  12.5 mg was added to her regimen. She had presented to urgent care 12/10/2021 with left shoulder and LUE pain lasting for about 3 days, exacerbated by movement. She initially woke up with the pain, and denied any apparent injury. No suspicion for DVT, physical exam was consistent with muscular strain. Most recently  she followed up with our pharmacist 01/02/2022 and was well; felt like she had no restrictions on her ability to move around.  Today she reports her blood pressure has been well controlled at home, and her weight is stable (129-134 lb).  She has  been feeling well without any shortness of breath. Lately she has not needed to use her inhaler.  She remains active with working part-time at [employer]. If she works too long, she may become very fatigued. Generally she feels better the next day with a good night's sleep.  She endorses urinary frequency as well.  She denies any palpitations, chest pain, or peripheral edema. No lightheadedness, headaches, syncope, orthopnea, or PND.   Past Medical History:  Diagnosis Date   Allergy    Arthritis    Asthma    hx of   Chicken pox    Heart murmur    Migraines    Stroke [facility])     Past Surgical History:  Procedure Laterality Date   BUBBLE STUDY  03/18/2021   Procedure: BUBBLE STUDY;  Surgeon: Lenise Quince, MD;  Location: [facility] ENDOSCOPY;  Service: Cardiovascular;;   CESAREAN SECTION     COLONOSCOPY     TEE WITHOUT CARDIOVERSION N/A 03/18/2021   Procedure: TRANSESOPHAGEAL ECHOCARDIOGRAM (TEE);  Surgeon: Lenise Quince, MD;  Location: [facility] ENDOSCOPY;  Service: Cardiovascular;  Laterality: N/A;   TONSILLECTOMY  1959    Current Medications: Current Meds  Medication Sig   albuterol  (VENTOLIN  HFA) 108 (90 Base) MCG/ACT inhaler Inhale 2 puffs into the lungs every 6 (six) hours as needed for wheezing or shortness of breath.   aspirin  EC 81 MG tablet Take 1 tablet (81 mg total) by mouth daily. Swallow whole.   atorvastatin  (LIPITOR) 20 MG tablet Take 1 tablet (20 mg total) by mouth daily.   cholecalciferol  (VITAMIN D3) 25 MCG (1000 UNIT) tablet Take 1,000 Units by mouth daily.   fluticasone  (FLOVENT  HFA) 110 MCG/ACT inhaler Inhale 1 puff into the lungs in the morning and at bedtime.   JARDIANCE  10 MG TABS tablet TAKE 1 TABLET BY MOUTH DAILY BEFORE BREAKFAST.   losartan  (COZAAR ) 25 MG tablet Take 1 tablet (25 mg total) by mouth daily.   metoprolol  succinate (TOPROL -XL) 25 MG 24 hr tablet Take 0.5 tablets (12.5 mg total) by mouth daily. Take with or immediately following a meal.   Misc  Natural Products (OSTEO BI-FLEX ADV JOINT SHIELD) TABS Take 1 tablet by mouth daily.   OVER THE COUNTER MEDICATION Take 1 tablet by mouth daily. NEURIVA   Spacer/Aero-Holding Chambers (AEROCHAMBER PLUS) inhaler Use as instructed to use with inahaler.   spironolactone  (ALDACTONE ) 25 MG tablet TAKE 1/2 TABLET BY MOUTH EVERY DAY   vitamin C (ASCORBIC ACID ) 500 MG tablet Take 500 mg by mouth daily.   vitamin E 200 UNIT capsule Take 200 Units by mouth daily.     Allergies:   Tetracyclines & related   Social History   Tobacco Use   Smoking status: Never   Smokeless tobacco: Never  Vaping Use   Vaping Use: Never used  Substance Use Topics   Alcohol use: Yes    Comment: occasionally   Drug use: Yes    Frequency: 1.0 times per week    Types: Marijuana     Family History: The patient's family history includes Arthritis in her father and mother; Cancer in her brother, brother, and father; Colon polyps in her mother; Diabetes in her mother; Hypertension in her mother; Stroke  in her mother. There is no history of Colon cancer, Esophageal cancer, Rectal cancer, or Stomach cancer.  ROS:   Please see the history of present illness.    (+) Fatigue (+) Urinary frequency All other systems reviewed and are negative.  EKGs/Labs/Other Studies Reviewed:    The following studies were reviewed today:  Cardiac MRI 10/21/2021: IMPRESSION: 1. Normal LV size, mild hypertrophy, and mild systolic dysfunction (EF 45%). Apical hypertrabeculation.   2.  Normal RV size and systolic function (EF 52%)   3.  No late gadolinium enhancement to suggest myocardial scar   4. Moderate to severe mitral regurgitation (regurgitant fraction 39%)  Bilateral Carotid Duplex 09/18/2021: Summary:  Right Carotid: Velocities in the right ICA are consistent with a 1-39%  stenosis.   Left Carotid: Velocities in the left ICA are consistent with a 1-39%  stenosis.   Cardiac CTA 09/05/2021:  IMPRESSION: 1. Coronary  calcium  score of 5.7. This was 57th percentile for age-, sex, and race-matched controls.   2. Normal coronary origin with right dominance.   3. Minimal CAD (<25%) in the LAD/RCA.   4. Non-ischemic cardiomyopathy.   5. Limited evaluation of the mitral valve as a prospective acquisition was obtained. The AMVL is thickened and possible represents prior inflammatory pathology. No prolapse noted. Current mechanism of MR is related to tethering and restricted movement of the PMVL in systole (IIIB).  Echo 08/06/21  1. Left ventricular ejection fraction, by estimation, is 35 to 40%. The  left ventricle has moderately decreased function. The left ventricle  demonstrates global hypokinesis. Left ventricular diastolic parameters are  indeterminate.   2. Right ventricular systolic function is normal. The right ventricular  size is normal.   3. Left atrial size was moderately dilated.   4. The mitral valve is normal in structure. Moderate to severe mitral  valve regurgitation. No evidence of mitral stenosis. There is mild  holosystolic prolapse of the middle segment of the anterior leaflet of the  mitral valve.   5. The aortic valve is normal in structure. Aortic valve regurgitation is  not visualized. No aortic stenosis is present.   6. The inferior vena cava is normal in size with greater than 50%  respiratory variability, suggesting right atrial pressure of 3 mmHg.   Monitor 04/24/21 IMPRESSION: No atrial fibrillation in setting of recent stroke.   Echo TEE 03/18/21 1. Left ventricular ejection fraction, by estimation, is 30 to 35%. The  left ventricle has moderately decreased function. The left ventricle  demonstrates global hypokinesis.   2. Right ventricular systolic function is normal. The right ventricular  size is normal.   3. Left atrial size was severely dilated. No left atrial/left atrial  appendage thrombus was detected.   4. The mitral valve is abnormal. Moderate to severe  mitral valve  regurgitation. There is mild late systolic prolapse of the middle segment  of the anterior leaflet of the mitral valve.   5. The aortic valve is tricuspid. Aortic valve regurgitation is not  visualized.   6. There is mild (Grade II) plaque involving the descending aorta.   7. Evidence of atrial level shunting detected by color flow Doppler.  Agitated saline contrast bubble study was positive with shunting observed  after >6 cardiac cycles suggestive of intrapulmonary shunting.   EKG:  EKG is personally reviewed. 03/10/2022: Sinus rhythm. LAE. Nonspecific T wave abnormality. 08/27/21: NSR, possible LAE, nonspecific T wave abnl.  Imaging studies that I have independently reviewed today: TTE 08/06/21 and TEE  03/18/21, CCTA 09/05/21  Recent Labs: 03/19/2021: TSH 1.930 06/06/2021: ALT 16 10/16/2021: Hemoglobin 14.0; Platelets 218 12/27/2021: BUN 18; Creatinine, Ser 0.82; Potassium 4.5; Sodium 141   Recent Lipid Panel    Component Value Date/Time   CHOL 154 06/06/2021 0821   TRIG 53 06/06/2021 0821   HDL 83 06/06/2021 0821   CHOLHDL 1.9 06/06/2021 0821   CHOLHDL 2.7 03/16/2021 0548   VLDL 14 03/16/2021 0548   LDLCALC 60 06/06/2021 0821    Physical Exam:    VS:  BP 112/60   Pulse 65   Ht 5\' 3"  (1.6 m)   Wt 135 lb 9.6 oz (61.5 kg)   SpO2 98%   BMI 24.02 kg/m     Wt Readings from Last 5 Encounters:  03/10/22 135 lb 9.6 oz (61.5 kg)  01/02/22 137 lb 9.6 oz (62.4 kg)  12/05/21 134 lb 9.6 oz (61.1 kg)  11/06/21 140 lb 9.6 oz (63.8 kg)  09/17/21 134 lb 6.4 oz (61 kg)    Constitutional: No acute distress Eyes: sclera non-icteric, normal conjunctiva and lids ENMT: normal dentition, moist mucous membranes Cardiovascular: regular rhythm, normal rate, 1/6 HSM. S1 and S2 normal. No jugular venous distention.  Respiratory: clear to auscultation bilaterally GI : normal bowel sounds, soft and nontender. No distention.   MSK: extremities warm, well perfused.  Trace bilateral  edema.  NEURO: grossly nonfocal exam, moves all extremities. PSYCH: alert and oriented x 3, normal mood and affect.   ASSESSMENT:    1. Chronic combined systolic (congestive) and diastolic (congestive) heart failure (HCC)   2. HFrEF (heart failure with reduced ejection fraction) (HCC)   3. Nonrheumatic mitral valve regurgitation   4. Hyperlipidemia LDL goal <70   5. Essential (primary) hypertension   6. Medication management   7. Cerebrovascular accident (CVA) due to embolism of left carotid artery (HCC)   8. Bilateral carotid artery stenosis      PLAN:    Chronic combined systolic and diastolic CHF (congestive heart failure) (HCC) -  HFrEF (heart failure with reduced ejection fraction) (HCC) - Medication management-  -Coronary CTA performed with minimal CAD.  Cardiac MRI showed Mild nonischemic LV dysfunction and moderate-severe MR. Surprisingly, no significant murmur on exam, suspect very eccentric jet. Heart Failure Therapy - continue at current doses ACE-I/ARB/ARNI: losartan  25 mg daily BB: metoprolol  succinate 12.5 mg daily MRA: spironolactone  12.5 mg daily SGLT2I: jardience 10 mg daily Diuretic plan: no loop diuretic required. NYHA class I symptoms.  Nonrheumatic mitral valve regurgitation, mod severe by quantitation-  -Moderate to severe mitral valve regurgitation.  Mixed etiology.  Will follow with echo this summer at 6 mo mark f/u after.  Hyperlipidemia LDL goal <70 -Most recent LDL is less than 70, continue atorvastatin  20 mg daily  Cerebrovascular accident (CVA) due to embolism of left carotid artery (HCC) -Followed closely by neurology.  Agreed that Eliquis  no longer indicated from cardiac standpoint.  Continue aspirin  81 mg daily.  Essential (primary) hypertension-blood pressure is permissible for adding additional heart failure regimen, and is overall well controlled.  Total time of encounter: 30 minutes total time of encounter, including 20 minutes spent in  face-to-face patient care on the date of this encounter. This time includes coordination of care and counseling regarding above mentioned problem list. Remainder of non-face-to-face time involved reviewing chart documents/testing relevant to the patient encounter and documentation in the medical record. I have independently reviewed documentation from referring provider.   Grady Lawman, MD, [facility]  [facility]  HeartCare    Shared Decision Making/Informed Consent:       Medication Adjustments/Labs and Tests Ordered: Current medicines are reviewed at length with the patient today.  Concerns regarding medicines are outlined above.   Orders Placed This Encounter  Procedures   EKG 12-Lead   ECHOCARDIOGRAM COMPLETE   No orders of the defined types were placed in this encounter.  Patient Instructions  Medication Instructions:  No Changes In Medications at this time.  *If you need a refill on your cardiac medications before your next appointment, please call your pharmacy*  Testing/Procedures: Your physician has requested that you have an echocardiogram. Echocardiography is a painless test that uses sound waves to create images of your heart. It provides your doctor with information about the size and shape of your heart and how well your heart's chambers and valves are working. You may receive an ultrasound enhancing agent through an IV if needed to better visualize your heart during the echo.This procedure takes approximately one hour. There are no restrictions for this procedure. This will take place at the [address].  PLEASE SCHEDULE THIS ON June 19th.   Follow-Up: At [facility], you and your health needs are our priority.  As part of our continuing mission to provide you with exceptional heart care, we have created designated Provider Care Teams.  These Care Teams include your primary Cardiologist (physician) and Advanced Practice Providers (APPs -  Physician  Assistants and Nurse Practitioners) who all work together to provide you with the care you need, when you need it.  We recommend signing up for the patient portal called "MyChart".  Sign up information is provided on this After Visit Summary.  MyChart is used to connect with patients for Virtual Visits (Telemedicine).  Patients are able to view lab/test results, encounter notes, upcoming appointments, etc.  Non-urgent messages can be sent to your provider as well.   To learn more about what you can do with MyChart, go to [email or web address].    Your next appointment:   June 21st AT 920  The format for your next appointment:   In Person  Provider:   Euell Herrlich, MD       [facility] Stump[facility],acting as a scribe for Euell Herrlich, MD.,have documented all relevant documentation on the behalf of Euell Herrlich, MD,as directed by  Euell Herrlich, MD while in the presence of Euell Herrlich, MD.  I, Euell Herrlich, MD, have reviewed all documentation for this visit. The documentation on today's date of service for the exam, diagnosis, procedures, and orders are all accurate and complete.

## 2022-03-10 NOTE — Patient Instructions (Signed)
Medication Instructions:  ?No Changes In Medications at this time.  ?*If you need a refill on your cardiac medications before your next appointment, please call your pharmacy* ? ?Testing/Procedures: ?Your physician has requested that you have an echocardiogram. Echocardiography is a painless test that uses sound waves to create images of your heart. It provides your doctor with information about the size and shape of your heart and how well your heart?s chambers and valves are working. You may receive an ultrasound enhancing agent through an IV if needed to better visualize your heart during the echo.This procedure takes approximately one hour. There are no restrictions for this procedure. This will take place at the [address], [address].  ?PLEASE SCHEDULE THIS ON June 19th.  ? ?Follow-Up: ?At [facility], you and your health needs are our priority.  As part of our continuing mission to provide you with exceptional heart care, we have created designated Provider Care Teams.  These Care Teams include your primary Cardiologist (physician) and Advanced Practice Providers (APPs -  Physician Assistants and Nurse Practitioners) who all work together to provide you with the care you need, when you need it. ? ?We recommend signing up for the patient portal called "MyChart".  Sign up information is provided on this After Visit Summary.  MyChart is used to connect with patients for Virtual Visits (Telemedicine).  Patients are able to view lab/test results, encounter notes, upcoming appointments, etc.  Non-urgent messages can be sent to your provider as well.   ?To learn more about what you can do with MyChart, go to [email or web address].   ? ?Your next appointment:   ?June 21st AT 920 ? ?The format for your next appointment:   ?In Person ? ?Provider:   ?Parke Poisson, MD   ? ? ?

## 2022-03-24 ENCOUNTER — Other Ambulatory Visit: Payer: Self-pay | Admitting: Family Medicine

## 2022-03-24 DIAGNOSIS — I5020 Unspecified systolic (congestive) heart failure: Secondary | ICD-10-CM

## 2022-05-05 ENCOUNTER — Ambulatory Visit (HOSPITAL_COMMUNITY): Payer: Medicare Other | Attending: Internal Medicine

## 2022-05-05 DIAGNOSIS — I340 Nonrheumatic mitral (valve) insufficiency: Secondary | ICD-10-CM | POA: Diagnosis not present

## 2022-05-06 LAB — ECHOCARDIOGRAM COMPLETE
Area-P 1/2: 2.56 cm2
MV M vel: 5.86 m/s
MV Peak grad: 137.1 mmHg
Radius: 0.9 cm
S' Lateral: 3.1 cm

## 2022-05-07 ENCOUNTER — Ambulatory Visit: Payer: Medicare Other | Admitting: Internal Medicine

## 2022-05-12 ENCOUNTER — Telehealth (INDEPENDENT_AMBULATORY_CARE_PROVIDER_SITE_OTHER): Payer: Medicare Other | Admitting: Family Medicine

## 2022-05-12 ENCOUNTER — Telehealth: Payer: Medicare Other | Admitting: Family Medicine

## 2022-05-12 ENCOUNTER — Encounter: Payer: Self-pay | Admitting: Family Medicine

## 2022-05-12 VITALS — Ht 63.0 in | Wt 135.6 lb

## 2022-05-12 DIAGNOSIS — J069 Acute upper respiratory infection, unspecified: Secondary | ICD-10-CM

## 2022-05-12 DIAGNOSIS — H109 Unspecified conjunctivitis: Secondary | ICD-10-CM

## 2022-05-12 MED ORDER — POLYMYXIN B-TRIMETHOPRIM 10000-0.1 UNIT/ML-% OP SOLN: OPHTHALMIC | 0 refills | Status: DC

## 2022-05-12 NOTE — Progress Notes (Signed)
 Patient ID: Leah Pierce, female   DOB: Jul 29, 1951, 71 y.o.   MRN: [id number]  Virtual Visit via Video Note  I connected with Oza Blumenthal on 05/12/22 at 10:00 AM EDT by a video enabled telemedicine application and verified that I am speaking with the correct person using two identifiers.  Location patient: home Location provider:work or home office Persons participating in the virtual visit: patient, provider  I discussed the limitations of evaluation and management by telemedicine and the availability of in person appointments. The patient expressed understanding and agreed to proceed.   HPI: Myca has chief complaint of some matting and drainage from both eyes which started this past Saturday.  She had some redness.  No blurred vision.  She also relates onset about the same time of sore throat, chills, nasal congestion.  No significant cough.  No headaches.  No facial pain.  Recently around her great nephew and nephew but they had no obvious symptoms at the time.  She has not done any COVID testing.  No dyspnea.   ROS: See pertinent positives and negatives per HPI.  Past Medical History:  Diagnosis Date   Allergy    Arthritis    Asthma    hx of   Chicken pox    Heart murmur    Migraines    Stroke [facility])     Past Surgical History:  Procedure Laterality Date   BUBBLE STUDY  03/18/2021   Procedure: BUBBLE STUDY;  Surgeon: Lenise Quince, MD;  Location: [facility] ENDOSCOPY;  Service: Cardiovascular;;   CESAREAN SECTION     COLONOSCOPY     TEE WITHOUT CARDIOVERSION N/A 03/18/2021   Procedure: TRANSESOPHAGEAL ECHOCARDIOGRAM (TEE);  Surgeon: Lenise Quince, MD;  Location: [facility] ENDOSCOPY;  Service: Cardiovascular;  Laterality: N/A;   TONSILLECTOMY  1959    Family History  Problem Relation Age of Onset   Arthritis Mother    Stroke Mother    Hypertension Mother    Diabetes Mother    Colon polyps Mother    Arthritis Father    Cancer Father        Prostate   Cancer Brother         prostate   Cancer Brother        Pancreas   Colon cancer Neg Hx    Esophageal cancer Neg Hx    Rectal cancer Neg Hx    Stomach cancer Neg Hx     SOCIAL HX: Non-smoker   Current Outpatient Medications:    albuterol  (VENTOLIN  HFA) 108 (90 Base) MCG/ACT inhaler, Inhale 2 puffs into the lungs every 6 (six) hours as needed for wheezing or shortness of breath., Disp: 18 g, Rfl: 3   aspirin  EC 81 MG tablet, Take 1 tablet (81 mg total) by mouth daily. Swallow whole., Disp: 30 tablet, Rfl: 11   atorvastatin  (LIPITOR) 20 MG tablet, Take 1 tablet (20 mg total) by mouth daily., Disp: 90 tablet, Rfl: 3   cholecalciferol  (VITAMIN D3) 25 MCG (1000 UNIT) tablet, Take 1,000 Units by mouth daily., Disp: , Rfl:    fluticasone  (FLOVENT  HFA) 110 MCG/ACT inhaler, Inhale 1 puff into the lungs in the morning and at bedtime., Disp: 1 Inhaler, Rfl: 3   JARDIANCE  10 MG TABS tablet, TAKE 1 TABLET BY MOUTH EVERY DAY BEFORE BREAKFAST, Disp: 30 tablet, Rfl: 3   losartan  (COZAAR ) 25 MG tablet, Take 1 tablet (25 mg total) by mouth daily., Disp: 90 tablet, Rfl: 2   metoprolol  succinate (TOPROL -XL) 25 MG  24 hr tablet, Take 0.5 tablets (12.5 mg total) by mouth daily. Take with or immediately following a meal., Disp: 45 tablet, Rfl: 3   Misc Natural Products (OSTEO BI-FLEX ADV JOINT SHIELD) TABS, Take 1 tablet by mouth daily., Disp: , Rfl:    OVER THE COUNTER MEDICATION, Take 1 tablet by mouth daily. NEURIVA, Disp: , Rfl:    Spacer/Aero-Holding Chambers (AEROCHAMBER PLUS) inhaler, Use as instructed to use with inahaler., Disp: 1 each, Rfl: 1   spironolactone  (ALDACTONE ) 25 MG tablet, TAKE 1/2 TABLET BY MOUTH EVERY DAY, Disp: 45 tablet, Rfl: 1   trimethoprim -polymyxin b  (POLYTRIM ) ophthalmic solution, Place 2 drops into both eyes every 4 hours while awake, Disp: 10 mL, Rfl: 0   vitamin C (ASCORBIC ACID ) 500 MG tablet, Take 500 mg by mouth daily., Disp: , Rfl:    vitamin E 200 UNIT capsule, Take 200 Units by mouth  daily., Disp: , Rfl:   EXAM:  VITALS per patient if applicable:  GENERAL: alert, oriented, appears well and in no acute distress  HEENT: atraumatic, conjunttiva clear, no obvious abnormalities on inspection of external nose and ears  NECK: normal movements of the head and neck  LUNGS: on inspection no signs of respiratory distress, breathing rate appears normal, no obvious gross SOB, gasping or wheezing  CV: no obvious cyanosis  MS: moves all visible extremities without noticeable abnormality  PSYCH/NEURO: pleasant and cooperative, no obvious depression or anxiety, speech and thought processing grossly intact  ASSESSMENT AND PLAN:  Discussed the following assessment and plan:  Bacterial conjunctivitis  Viral URI  Suspect viral URI with associated bilateral bacterial conjunctivitis. -We did suggest she consider COVID testing to rule out -Start Polytrim  ophthalmic drops 2 drops each eye every 4 hours while awake. -Continue warm compresses several times daily -Stay well-hydrated -Follow-up for any persistent or worsening symptoms   I discussed the assessment and treatment plan with the patient. The patient was provided an opportunity to ask questions and all were answered. The patient agreed with the plan and demonstrated an understanding of the instructions.   The patient was advised to call back or seek an in-person evaluation if the symptoms worsen or if the condition fails to improve as anticipated.     Glean Lamy, MD

## 2022-05-16 NOTE — Progress Notes (Signed)
ACUTE VISIT No chief complaint on file.  HPI: Leah Pierce is a 71 y.o. female, who is here today complaining of *** HPI  Review of Systems Rest see pertinent positives and negatives per HPI.  Current Outpatient Medications on File Prior to Visit  Medication Sig Dispense Refill  . albuterol (VENTOLIN HFA) 108 (90 Base) MCG/ACT inhaler Inhale 2 puffs into the lungs every 6 (six) hours as needed for wheezing or shortness of breath. 18 g 3  . aspirin EC 81 MG tablet Take 1 tablet (81 mg total) by mouth daily. Swallow whole. 30 tablet 11  . atorvastatin (LIPITOR) 20 MG tablet Take 1 tablet (20 mg total) by mouth daily. 90 tablet 3  . cholecalciferol (VITAMIN D3) 25 MCG (1000 UNIT) tablet Take 1,000 Units by mouth daily.    . fluticasone (FLOVENT HFA) 110 MCG/ACT inhaler Inhale 1 puff into the lungs in the morning and at bedtime. 1 Inhaler 3  . JARDIANCE 10 MG TABS tablet TAKE 1 TABLET BY MOUTH EVERY DAY BEFORE BREAKFAST 30 tablet 3  . losartan (COZAAR) 25 MG tablet Take 1 tablet (25 mg total) by mouth daily. 90 tablet 2  . metoprolol succinate (TOPROL-XL) 25 MG 24 hr tablet Take 0.5 tablets (12.5 mg total) by mouth daily. Take with or immediately following a meal. 45 tablet 3  . Misc Natural Products (OSTEO BI-FLEX ADV JOINT SHIELD) TABS Take 1 tablet by mouth daily.    Marland Kitchen OVER THE COUNTER MEDICATION Take 1 tablet by mouth daily. NEURIVA    . Spacer/Aero-Holding Chambers (AEROCHAMBER PLUS) inhaler Use as instructed to use with inahaler. 1 each 1  . spironolactone (ALDACTONE) 25 MG tablet TAKE 1/2 TABLET BY MOUTH EVERY DAY 45 tablet 1  . trimethoprim-polymyxin b (POLYTRIM) ophthalmic solution Place 2 drops into both eyes every 4 hours while awake 10 mL 0  . vitamin C (ASCORBIC ACID) 500 MG tablet Take 500 mg by mouth daily.    . vitamin E 200 UNIT capsule Take 200 Units by mouth daily.     No current facility-administered medications on file prior to visit.     Past Medical  History:  Diagnosis Date  . Allergy   . Arthritis   . Asthma    hx of  . Chicken pox   . Heart murmur   . Migraines   . Stroke [facility])    Allergies  Allergen Reactions  . Tetracyclines & Related     Rash on hands    Social History   Socioeconomic History  . Marital status: Single    Spouse name: Not on file  . Number of children: 1  . Years of education: Not on file  . Highest education level: Not on file  Occupational History  . Not on file  Tobacco Use  . Smoking status: Never  . Smokeless tobacco: Never  Vaping Use  . Vaping Use: Never used  Substance and Sexual Activity  . Alcohol use: Yes    Comment: occasionally  . Drug use: Yes    Frequency: 1.0 times per week    Types: Marijuana  . Sexual activity: Not on file  Other Topics Concern  . Not on file  Social History Narrative   Lives alone, her son dies   Right Handed   Drinks 1 cups caffeine daily   Social Determinants of Health   Financial Resource Strain: Low Risk  (07/19/2021)   Overall Financial Resource Strain (CARDIA)   . Difficulty of  Paying Living Expenses: Not hard at all  Food Insecurity: No Food Insecurity (07/19/2021)   Hunger Vital Sign   . Worried About Programme researcher, broadcasting/film/video in the Last Year: Never true   . Ran Out of Food in the Last Year: Never true  Transportation Needs: No Transportation Needs (07/19/2021)   PRAPARE - Transportation   . Lack of Transportation (Medical): No   . Lack of Transportation (Non-Medical): No  Physical Activity: Sufficiently Active (07/19/2021)   Exercise Vital Sign   . Days of Exercise per Week: 4 days   . Minutes of Exercise per Session: 40 min  Stress: No Stress Concern Present (07/19/2021)   [employer] of Occupational Health - Occupational Stress Questionnaire   . Feeling of Stress : Not at all  Social Connections: Socially Isolated (07/19/2021)   Social Connection and Isolation Panel [NHANES]   . Frequency of Communication with Friends and Family: More  than three times a week   . Frequency of Social Gatherings with Friends and Family: More than three times a week   . Attends Religious Services: Never   . Active Member of Clubs or Organizations: No   . Attends Banker Meetings: Never   . Marital Status: Divorced    There were no vitals filed for this visit. There is no height or weight on file to calculate BMI.  Physical Exam  ASSESSMENT AND PLAN:  There are no diagnoses linked to this encounter.   No follow-ups on file.   Daisy Lites G. [place], MD  [facility]. [place] office.  Discharge Instructions   None

## 2022-05-19 ENCOUNTER — Ambulatory Visit (INDEPENDENT_AMBULATORY_CARE_PROVIDER_SITE_OTHER): Payer: Medicare Other | Admitting: Family Medicine

## 2022-05-19 ENCOUNTER — Encounter: Payer: Self-pay | Admitting: Family Medicine

## 2022-05-19 VITALS — BP 120/78 | HR 76 | Temp 98.5°F | Resp 12 | Ht 63.0 in | Wt 130.5 lb

## 2022-05-19 DIAGNOSIS — I5020 Unspecified systolic (congestive) heart failure: Secondary | ICD-10-CM | POA: Diagnosis not present

## 2022-05-19 DIAGNOSIS — J069 Acute upper respiratory infection, unspecified: Secondary | ICD-10-CM

## 2022-05-19 MED ORDER — EMPAGLIFLOZIN 10 MG PO TABS: ORAL_TABLET | ORAL | 2 refills | Status: DC

## 2022-05-19 NOTE — Patient Instructions (Addendum)
A few things to remember from today's visit:  Viral upper respiratory tract infection  HFrEF (heart failure with reduced ejection fraction) (HCC)  If you need refills please call your pharmacy. Do not use My Chart to request refills or for acute issues that need immediate attention.   I do not think you are contagious at this time, you can go back to your normal activities. Continue all your medications.  Please be sure medication list is accurate. If a new problem present, please set up appointment sooner than planned today.

## 2022-06-16 ENCOUNTER — Other Ambulatory Visit: Payer: Self-pay | Admitting: Family Medicine

## 2022-06-16 DIAGNOSIS — Z1231 Encounter for screening mammogram for malignant neoplasm of breast: Secondary | ICD-10-CM

## 2022-06-26 ENCOUNTER — Other Ambulatory Visit: Payer: Self-pay | Admitting: Family Medicine

## 2022-07-03 DIAGNOSIS — H04123 Dry eye syndrome of bilateral lacrimal glands: Secondary | ICD-10-CM | POA: Diagnosis not present

## 2022-07-03 DIAGNOSIS — H43393 Other vitreous opacities, bilateral: Secondary | ICD-10-CM | POA: Diagnosis not present

## 2022-07-16 ENCOUNTER — Telehealth: Payer: Self-pay | Admitting: Family Medicine

## 2022-07-16 ENCOUNTER — Ambulatory Visit
Admission: RE | Admit: 2022-07-16 | Discharge: 2022-07-16 | Disposition: A | Discharge status: Home / Self Care | Payer: Medicare Other | Source: Ambulatory Visit | Attending: Family Medicine | Admitting: Family Medicine

## 2022-07-16 DIAGNOSIS — Z1231 Encounter for screening mammogram for malignant neoplasm of breast: Secondary | ICD-10-CM

## 2022-07-16 NOTE — Telephone Encounter (Signed)
Left message for patient to call back and schedule Medicare Annual Wellness Visit (AWV) either virtually or in office. Left  my jabber number [phone]   Last AWV 07/19/21  ; please schedule at anytime with [facility] Nurse Health Advisor 1 or 2   

## 2022-07-24 ENCOUNTER — Ambulatory Visit (INDEPENDENT_AMBULATORY_CARE_PROVIDER_SITE_OTHER): Payer: Medicare Other

## 2022-07-24 VITALS — Ht 63.0 in | Wt 130.0 lb

## 2022-07-24 DIAGNOSIS — Z0000 Encounter for general adult medical examination without abnormal findings: Secondary | ICD-10-CM

## 2022-07-24 NOTE — Patient Instructions (Addendum)
Leah Pierce , Thank you for taking time to come for your Medicare Wellness Visit. I appreciate your ongoing commitment to your health goals. Please review the following plan we discussed and let me know if I can assist you in the future.   Screening recommendations/referrals: Colonoscopy: Done 06/01/17 Repeat 10 Yrs Mammogram: Done Repeat 1 Yr Bone Density: Done Recommended yearly ophthalmology/optometry visit for glaucoma screening and checkup Recommended yearly dental visit for hygiene and checkup  Vaccinations:  Pneumococcal vaccine: Up to datw     Covid-19:Done  Advanced directives: Advance directive discussed with you today. Even though you declined this today, please call our office should you change your mind, and we can give you the proper paperwork for you to fill out.   Conditions/risks identified: None  Next appointment: Follow up in one year for your annual wellness visit    Preventive Care 70 Years and Older, Female Preventive care refers to lifestyle choices and visits with your health care provider that can promote health and wellness. What does preventive care include? A yearly physical exam. This is also called an annual well check. Dental exams once or twice a year. Routine eye exams. Ask your health care provider how often you should have your eyes checked. Personal lifestyle choices, including: Daily care of your teeth and gums. Regular physical activity. Eating a healthy diet. Avoiding tobacco and drug use. Limiting alcohol use. Practicing safe sex. Taking low-dose aspirin every day. Taking vitamin and mineral supplements as recommended by your health care provider. What happens during an annual well check? The services and screenings done by your health care provider during your annual well check will depend on your age, overall health, lifestyle risk factors, and family history of disease. Counseling  Your health care provider may ask you questions about  your: Alcohol use. Tobacco use. Drug use. Emotional well-being. Home and relationship well-being. Sexual activity. Eating habits. History of falls. Memory and ability to understand (cognition). Work and work Astronomer. Reproductive health. Screening  You may have the following tests or measurements: Height, weight, and BMI. Blood pressure. Lipid and cholesterol levels. These may be checked every 5 years, or more frequently if you are over 63 years old. Skin check. Lung cancer screening. You may have this screening every year starting at age 22 if you have a 30-pack-year history of smoking and currently smoke or have quit within the past 15 years. Fecal occult blood test (FOBT) of the stool. You may have this test every year starting at age 57. Flexible sigmoidoscopy or colonoscopy. You may have a sigmoidoscopy every 5 years or a colonoscopy every 10 years starting at age 59. Hepatitis C blood test. Hepatitis B blood test. Sexually transmitted disease (STD) testing. Diabetes screening. This is done by checking your blood sugar (glucose) after you have not eaten for a while (fasting). You may have this done every 1-3 years. Bone density scan. This is done to screen for osteoporosis. You may have this done starting at age 56. Mammogram. This may be done every 1-2 years. Talk to your health care provider about how often you should have regular mammograms. Talk with your health care provider about your test results, treatment options, and if necessary, the need for more tests. Vaccines  Your health care provider may recommend certain vaccines, such as: Influenza vaccine. This is recommended every year. Tetanus, diphtheria, and acellular pertussis (Tdap, Td) vaccine. You may need a Td booster every 10 years. Zoster vaccine. You may need this after  age 32. Pneumococcal 13-valent conjugate (PCV13) vaccine. One dose is recommended after age 14. Pneumococcal polysaccharide (PPSV23) vaccine.  One dose is recommended after age 57. Talk to your health care provider about which screenings and vaccines you need and how often you need them. This information is not intended to replace advice given to you by your health care provider. Make sure you discuss any questions you have with your health care provider. Document Released: 11/30/2015 Document Revised: 07/23/2016 Document Reviewed: 09/04/2015 Elsevier Interactive Patient Education  2017 [place] Prevention in the Home Falls can cause injuries. They can happen to people of all ages. There are many things you can do to make your home safe and to help prevent falls. What can I do on the outside of my home? Regularly fix the edges of walkways and driveways and fix any cracks. Remove anything that might make you trip as you walk through a door, such as a raised step or threshold. Trim any bushes or trees on the path to your home. Use bright outdoor lighting. Clear any walking paths of anything that might make someone trip, such as rocks or tools. Regularly check to see if handrails are loose or broken. Make sure that both sides of any steps have handrails. Any raised decks and porches should have guardrails on the edges. Have any leaves, snow, or ice cleared regularly. Use sand or salt on walking paths during winter. Clean up any spills in your garage right away. This includes oil or grease spills. What can I do in the bathroom? Use night lights. Install grab bars by the toilet and in the tub and shower. Do not use towel bars as grab bars. Use non-skid mats or decals in the tub or shower. If you need to sit down in the shower, use a plastic, non-slip stool. Keep the floor dry. Clean up any water that spills on the floor as soon as it happens. Remove soap buildup in the tub or shower regularly. Attach bath mats securely with double-sided non-slip rug tape. Do not have throw rugs and other things on the floor that can make  you trip. What can I do in the bedroom? Use night lights. Make sure that you have a light by your bed that is easy to reach. Do not use any sheets or blankets that are too big for your bed. They should not hang down onto the floor. Have a firm chair that has side arms. You can use this for support while you get dressed. Do not have throw rugs and other things on the floor that can make you trip. What can I do in the kitchen? Clean up any spills right away. Avoid walking on wet floors. Keep items that you use a lot in easy-to-reach places. If you need to reach something above you, use a strong step stool that has a grab bar. Keep electrical cords out of the way. Do not use floor polish or wax that makes floors slippery. If you must use wax, use non-skid floor wax. Do not have throw rugs and other things on the floor that can make you trip. What can I do with my stairs? Do not leave any items on the stairs. Make sure that there are handrails on both sides of the stairs and use them. Fix handrails that are broken or loose. Make sure that handrails are as long as the stairways. Check any carpeting to make sure that it is firmly attached to the stairs.  Fix any carpet that is loose or worn. Avoid having throw rugs at the top or bottom of the stairs. If you do have throw rugs, attach them to the floor with carpet tape. Make sure that you have a light switch at the top of the stairs and the bottom of the stairs. If you do not have them, ask someone to add them for you. What else can I do to help prevent falls? Wear shoes that: Do not have high heels. Have rubber bottoms. Are comfortable and fit you well. Are closed at the toe. Do not wear sandals. If you use a stepladder: Make sure that it is fully opened. Do not climb a closed stepladder. Make sure that both sides of the stepladder are locked into place. Ask someone to hold it for you, if possible. Clearly mark and make sure that you can  see: Any grab bars or handrails. First and last steps. Where the edge of each step is. Use tools that help you move around (mobility aids) if they are needed. These include: Canes. Walkers. Scooters. Crutches. Turn on the lights when you go into a dark area. Replace any light bulbs as soon as they burn out. Set up your furniture so you have a clear path. Avoid moving your furniture around. If any of your floors are uneven, fix them. If there are any pets around you, be aware of where they are. Review your medicines with your doctor. Some medicines can make you feel dizzy. This can increase your chance of falling. Ask your doctor what other things that you can do to help prevent falls. This information is not intended to replace advice given to you by your health care provider. Make sure you discuss any questions you have with your health care provider. Document Released: 08/30/2009 Document Revised: 04/10/2016 Document Reviewed: 12/08/2014 Elsevier Interactive Patient Education  2017 [employer].

## 2022-07-24 NOTE — Progress Notes (Signed)
Subjective:   Leah Pierce is a 71 y.o. female who presents for Medicare Annual (Subsequent) preventive examination.  Review of Systems    Virtual Visit via Telephone Note  I connected with  Leah Pierce on 07/24/22 at  2:00 PM EDT by telephone and verified that I am speaking with the correct person using two identifiers.  Location: Patient: home Provider: Office Persons participating in the virtual visit: patient/Nurse Health Advisor   I discussed the limitations, risks, security and privacy concerns of performing an evaluation and management service by telephone and the availability of in person appointments. The patient expressed understanding and agreed to proceed.  Interactive audio and video telecommunications were attempted between this nurse and patient, however failed, due to patient having technical difficulties OR patient did not have access to video capability.  We continued and completed visit with audio only.  Some vital signs may be absent or patient reported.   Leah Rung, LPN  Cardiac Risk Factors include: advanced age (>63men, >12 women);hypertension     Objective:    Today's Vitals   07/24/22 1422  Weight: 130 lb (59 kg)  Height: 5\' 3"  (1.6 m)   Body mass index is 23.03 kg/m.     07/24/2022    2:29 PM 07/19/2021   11:29 AM 03/27/2021   11:05 AM 03/18/2021   11:21 AM 03/15/2021    5:40 PM 03/15/2021    2:51 PM 06/01/2017    8:10 AM  Advanced Directives  Does Patient Have a Medical Advance Directive? No No No No No No No  Would patient like information on creating a medical advance directive? No - Patient declined No - Patient declined Yes (MAU/Ambulatory/Procedural Areas - Information given) No - Patient declined Yes (Inpatient - patient requests chaplain consult to create a medical advance directive)      Current Medications (verified) Outpatient Encounter Medications as of 07/24/2022  Medication Sig   albuterol (VENTOLIN HFA) 108 (90  Base) MCG/ACT inhaler Inhale 2 puffs into the lungs every 6 (six) hours as needed for wheezing or shortness of breath.   aspirin EC 81 MG tablet Take 1 tablet (81 mg total) by mouth daily. Swallow whole.   atorvastatin (LIPITOR) 20 MG tablet Take 1 tablet (20 mg total) by mouth daily.   cholecalciferol (VITAMIN D3) 25 MCG (1000 UNIT) tablet Take 1,000 Units by mouth daily.   empagliflozin (JARDIANCE) 10 MG TABS tablet TAKE 1 TABLET BY MOUTH EVERY DAY BEFORE BREAKFAST   fluticasone (FLOVENT HFA) 110 MCG/ACT inhaler Inhale 1 puff into the lungs in the morning and at bedtime.   losartan (COZAAR) 25 MG tablet TAKE 1 TABLET (25 MG TOTAL) BY MOUTH DAILY.   metoprolol succinate (TOPROL-XL) 25 MG 24 hr tablet Take 0.5 tablets (12.5 mg total) by mouth daily. Take with or immediately following a meal.   Misc Natural Products (OSTEO BI-FLEX ADV JOINT SHIELD) TABS Take 1 tablet by mouth daily.   OVER THE COUNTER MEDICATION Take 1 tablet by mouth daily. NEURIVA   Spacer/Aero-Holding Chambers (AEROCHAMBER PLUS) inhaler Use as instructed to use with inahaler.   spironolactone (ALDACTONE) 25 MG tablet TAKE 1/2 TABLET BY MOUTH EVERY DAY   trimethoprim-polymyxin b (POLYTRIM) ophthalmic solution Place 2 drops into both eyes every 4 hours while awake   vitamin C (ASCORBIC ACID) 500 MG tablet Take 500 mg by mouth daily.   vitamin E 200 UNIT capsule Take 200 Units by mouth daily.   No facility-administered encounter medications on  file as of 07/24/2022.    Allergies (verified) Tetracyclines & related   History: Past Medical History:  Diagnosis Date   Allergy    Arthritis    Asthma    hx of   Chicken pox    Heart murmur    Migraines    Stroke [facility])    Past Surgical History:  Procedure Laterality Date   BUBBLE STUDY  03/18/2021   Procedure: BUBBLE STUDY;  Surgeon: Lewayne Bunting, MD;  Location: [facility] ENDOSCOPY;  Service: Cardiovascular;;   CESAREAN SECTION     COLONOSCOPY     TEE WITHOUT CARDIOVERSION  N/A 03/18/2021   Procedure: TRANSESOPHAGEAL ECHOCARDIOGRAM (TEE);  Surgeon: Lewayne Bunting, MD;  Location: [facility] ENDOSCOPY;  Service: Cardiovascular;  Laterality: N/A;   TONSILLECTOMY  1959   Family History  Problem Relation Age of Onset   Arthritis Mother    Stroke Mother    Hypertension Mother    Diabetes Mother    Colon polyps Mother    Arthritis Father    Cancer Father        Prostate   Cancer Brother        prostate   Cancer Brother        Pancreas   Colon cancer Neg Hx    Esophageal cancer Neg Hx    Rectal cancer Neg Hx    Stomach cancer Neg Hx    Social History   Socioeconomic History   Marital status: Single    Spouse name: Not on file   Number of children: 1   Years of education: Not on file   Highest education level: Not on file  Occupational History   Not on file  Tobacco Use   Smoking status: Never   Smokeless tobacco: Never  Vaping Use   Vaping Use: Never used  Substance and Sexual Activity   Alcohol use: Yes    Comment: occasionally   Drug use: Yes    Frequency: 1.0 times per week    Types: Marijuana   Sexual activity: Not on file  Other Topics Concern   Not on file  Social History Narrative   Lives alone, her son dies   Right Handed   Drinks 1 cups caffeine daily   Social Determinants of Health   Financial Resource Strain: Low Risk  (07/24/2022)   Overall Financial Resource Strain (CARDIA)    Difficulty of Paying Living Expenses: Not hard at all  Food Insecurity: No Food Insecurity (07/24/2022)   Hunger Vital Sign    Worried About Running Out of Food in the Last Year: Never true    Ran Out of Food in the Last Year: Never true  Transportation Needs: No Transportation Needs (07/24/2022)   PRAPARE - Administrator, Civil Service (Medical): No    Lack of Transportation (Non-Medical): No  Physical Activity: Inactive (07/24/2022)   Exercise Vital Sign    Days of Exercise per Week: 0 days    Minutes of Exercise per Session: 0 min  Stress:  No Stress Concern Present (07/24/2022)   [employer] of Occupational Health - Occupational Stress Questionnaire    Feeling of Stress : Not at all  Social Connections: Moderately Integrated (07/24/2022)   Social Connection and Isolation Panel [NHANES]    Frequency of Communication with Friends and Family: More than three times a week    Frequency of Social Gatherings with Friends and Family: More than three times a week    Attends Religious Services: More than  4 times per year    Active Member of Clubs or Organizations: Yes    Attends Music therapist: More than 4 times per year    Marital Status: Divorced    Tobacco Counseling Counseling given: Not Answered   Clinical Intake:  Pre-visit preparation completed: No  Pain : No/denies pain     BMI - recorded: 23.12 Nutritional Status: BMI of 19-24  Normal Nutritional Risks: None Diabetes: No  How often do you need to have someone help you when you read instructions, pamphlets, or other written materials from your doctor or pharmacy?: 1 - Never  Diabetic?  No  Interpreter Needed?: No  Information entered by :: Rolene Arbour LPN   Activities of Daily Living    07/24/2022    2:28 PM  In your present state of health, do you have any difficulty performing the following activities:  Hearing? 0  Vision? 0  Difficulty concentrating or making decisions? 0  Walking or climbing stairs? 0  Dressing or bathing? 0  Doing errands, shopping? 0  Preparing Food and eating ? N  Using the Toilet? N  In the past six months, have you accidently leaked urine? N  Do you have problems with loss of bowel control? N  Managing your Medications? N  Managing your Finances? N  Housekeeping or managing your Housekeeping? N    Patient Care Team: [place], Betty G, MD as PCP - General (Family Medicine) Elouise Munroe, MD as PCP - Cardiology (Cardiology)  Indicate any recent Medical Services you may have received from other  than [facility] providers in the past year (date may be approximate).     Assessment:   This is a routine wellness examination for Leah Pierce.  Hearing/Vision screen Hearing Screening - Comments:: Denies hearing difficulties   Vision Screening - Comments:: Wears rx glasses - up to date with routine eye exams with  [facility]  Dietary issues and exercise activities discussed: Current Exercise Habits: The patient does not participate in regular exercise at present, Exercise limited by: None identified   Goals Addressed               This Visit's Progress     No current goals (pt-stated)         Depression Screen    07/24/2022    2:25 PM 05/19/2022   11:02 AM 07/19/2021   11:31 AM 07/19/2021   11:27 AM 04/08/2021   11:09 AM 08/08/2020   12:31 PM 08/05/2019    3:02 PM  PHQ 2/9 Scores  PHQ - 2 Score 0 2 0 0 0 0 0  PHQ- 9 Score  5         Fall Risk    07/24/2022    2:29 PM 05/19/2022   11:02 AM 07/19/2021   11:31 AM 04/08/2021   11:09 AM 08/08/2020    1:03 PM  [place] in the past year? 0 0 0 0 1  Number falls in past yr: 0 0 0 0 0  Injury with Fall? 0 0 0  1  Risk for fall due to : No Fall Risks  No Fall Risks No Fall Risks History of fall(s)  Follow up Falls prevention discussed Falls evaluation completed Falls evaluation completed Falls evaluation completed Education provided    [place]:  Any stairs in or around the home? Yes  If so, are there any without handrails? No  Home free of loose  throw rugs in walkways, pet beds, electrical cords, etc? Yes  Adequate lighting in your home to reduce risk of falls? Yes   ASSISTIVE DEVICES UTILIZED TO PREVENT FALLS:  Life alert? No  Use of a cane, walker or w/c? No  Grab bars in the bathroom? Yes  Shower chair or bench in shower? Yes  Elevated toilet seat or a handicapped toilet? No   TIMED UP AND GO:  Was the test performed? No . Audio Visit  Cognitive Function:        07/24/2022     2:30 PM  6CIT Screen  What Year? 0 points  What month? 0 points  What time? 0 points  Count back from 20 0 points  Months in reverse 0 points  Repeat phrase 0 points  Total Score 0 points    Immunizations Immunization History  Administered Date(s) Administered   PFIZER(Purple Top)SARS-COV-2 Vaccination 12/08/2019, 12/29/2019, 09/01/2020   Pneumococcal Conjugate-13 04/21/2018   Pneumococcal Polysaccharide-23 02/03/2020     Pneumococcal vaccine status: Up to date  Covid-19 vaccine status: Completed vaccines    Screening Tests Health Maintenance  Topic Date Due   COVID-19 Vaccine (4 - Pfizer risk series) 08/09/2022 (Originally 10/27/2020)   MAMMOGRAM  07/17/2023   COLONOSCOPY (Pts 45-80yrs Insurance coverage will need to be confirmed)  06/02/2027   Pneumonia Vaccine 80+ Years old  Completed   DEXA SCAN  Completed   Hepatitis C Screening  Completed   HPV VACCINES  Aged Out   INFLUENZA VACCINE  Discontinued   TETANUS/TDAP  Discontinued   Zoster Vaccines- Shingrix  Discontinued    Health Maintenance  There are no preventive care reminders to display for this patient.   Colorectal cancer screening: Type of screening: Colonoscopy. Completed 06/01/17. Repeat every 10 years  Mammogram status: Completed 07/16/22. Repeat every year  Bone Density status: Completed 06/07/18. Results reflect: Bone density results: OSTEOPOROSIS. Repeat every   years.  Lung Cancer Screening: (Low Dose CT Chest recommended if Age 16-80 years, 30 pack-year currently smoking OR have quit w/in 15years.) does not qualify.     Additional Screening:  Hepatitis C Screening: does qualify; Completed 04/21/18  Vision Screening: Recommended annual ophthalmology exams for early detection of glaucoma and other disorders of the eye. Is the patient up to date with their annual eye exam?  Yes  Who is the provider or what is the name of the office in which the patient attends annual eye exams? [facility] If  pt is not established with a provider, would they like to be referred to a provider to establish care? No .   Dental Screening: Recommended annual dental exams for proper oral hygiene  Community Resource Referral / Chronic Care Management:  CRR required this visit?  No   CCM required this visit?  No      Plan:     I have personally reviewed and noted the following in the patient's chart:   Medical and social history Use of alcohol, tobacco or illicit drugs  Current medications and supplements including opioid prescriptions. Patient is not currently taking opioid prescriptions. Functional ability and status Nutritional status Physical activity Advanced directives List of other physicians Hospitalizations, surgeries, and ER visits in previous 12 months Vitals Screenings to include cognitive, depression, and falls Referrals and appointments  In addition, I have reviewed and discussed with patient certain preventive protocols, quality metrics, and best practice recommendations. A written personalized care plan for preventive services as well as general preventive health recommendations  were provided to patient.     Criselda Peaches, LPN   [phone]   Nurse Notes: None

## 2022-07-30 ENCOUNTER — Other Ambulatory Visit: Payer: Self-pay | Admitting: Internal Medicine

## 2022-08-04 ENCOUNTER — Other Ambulatory Visit: Payer: Self-pay | Admitting: Internal Medicine

## 2022-08-04 NOTE — Telephone Encounter (Signed)
*  STAT* If patient is at the pharmacy, call can be transferred to refill team.   1. Which medications need to be refilled? (please list name of each medication and dose if known)  spironolactone (ALDACTONE) 25 MG tablet  2. Which pharmacy/location (including street and city if local pharmacy) is medication to be sent to? [id number] - [place], [place] - [address]  3. Do they need a 30 day or 90 day supply? 90 day supply

## 2022-08-05 ENCOUNTER — Telehealth: Payer: Self-pay | Admitting: Internal Medicine

## 2022-08-05 MED ORDER — SPIRONOLACTONE 25 MG PO TABS: 12.5000 mg | ORAL_TABLET | Freq: Every day | ORAL | 1 refills | Status: DC

## 2022-08-05 NOTE — Telephone Encounter (Signed)
  *  STAT* If patient is at the pharmacy, call can be transferred to refill team.   1. Which medications need to be refilled? (please list name of each medication and dose if known) spironolactone (ALDACTONE) 25 MG tablet  2. Which pharmacy/location (including street and city if local pharmacy) is medication to be sent to? [id number] - [place], [place] - [address]  3. Do they need a 30 day or 90 day supply? 90 days  Pt said, he only have half pill left. Need refill today

## 2022-09-01 ENCOUNTER — Ambulatory Visit: Payer: Medicare Other | Attending: Internal Medicine | Admitting: Internal Medicine

## 2022-09-01 ENCOUNTER — Encounter: Payer: Self-pay | Admitting: Internal Medicine

## 2022-09-01 VITALS — BP 102/64 | HR 62 | Ht 63.0 in | Wt 134.8 lb

## 2022-09-01 DIAGNOSIS — I341 Nonrheumatic mitral (valve) prolapse: Secondary | ICD-10-CM

## 2022-09-01 DIAGNOSIS — I5020 Unspecified systolic (congestive) heart failure: Secondary | ICD-10-CM | POA: Diagnosis not present

## 2022-09-01 DIAGNOSIS — Z79899 Other long term (current) drug therapy: Secondary | ICD-10-CM | POA: Diagnosis not present

## 2022-09-01 DIAGNOSIS — I10 Essential (primary) hypertension: Secondary | ICD-10-CM

## 2022-09-01 DIAGNOSIS — I5042 Chronic combined systolic (congestive) and diastolic (congestive) heart failure: Secondary | ICD-10-CM | POA: Diagnosis not present

## 2022-09-01 DIAGNOSIS — E785 Hyperlipidemia, unspecified: Secondary | ICD-10-CM

## 2022-09-01 DIAGNOSIS — I340 Nonrheumatic mitral (valve) insufficiency: Secondary | ICD-10-CM | POA: Diagnosis not present

## 2022-09-01 DIAGNOSIS — I63132 Cerebral infarction due to embolism of left carotid artery: Secondary | ICD-10-CM

## 2022-09-01 DIAGNOSIS — I6523 Occlusion and stenosis of bilateral carotid arteries: Secondary | ICD-10-CM

## 2022-09-01 NOTE — Progress Notes (Signed)
Cardiology Office Note:    Date:  09/01/2022   ID:  Leah Pierce, DOB [birth year], MRN [id number]  PCP:  Martinique, Betty G, MD  Cardiologist:  Leah Munroe, MD  Electrophysiologist:  None   Referring MD: Martinique, Betty G, MD   Chief Complaint/Reason for Referral: MV disease, stroke, HFrEF  History of Present Illness:    Leah Pierce is a 71 y.o. female with a history of acute systolic HF, mitral valve regurgitation with abnormal appearing mitral valve, CVA, HLD, prediabetes, asthma. Presents for follow up.   09/01/22: doing really well. No issues. Continues to work without symptoms. NYHA class I sx. Reviewed most recent echo with stable mod-severe MR. Eventually the valve will need to be fixed I suspect. Mild LA dilation. LV normal size but reduced EF by echo and MRI. No arrhythmia/afib on monitor. Stroke was felt to be to large vessel disease vs cardiomyopathy.   The patient denies chest pain, chest pressure, dyspnea at rest or with exertion, palpitations, PND, orthopnea, or leg swelling. Denies cough, fever, chills. Denies nausea, vomiting. Denies syncope or presyncope. Denies dizziness or lightheadedness.   Initial visit: I initially met the patient when she was admitted for stroke and was noted to have a new cardiomyopathy and abnormal mitral valve.  She had a viral infection in the first part of April 2022, preceding stroke and HF diagnosis. Unclear if CM represents viral etiology.   Reviewed TTE from 08/06/21 and TEE from 03/18/21. MR is at least moderate severe. Mechanism appears mixed with some primary leaflet dysfunction of the anterior leaflet, and secondary MR with leaflet restriction, quite notable with the secondary chords attaching to the anterior leaflet. EF appears 40% on most recent TTE.   Historically she reported occasional paroxysms of shortness of breath for which she used her rescue inhaler prescribed by Dr. Martinique.  She does have a diagnosis of asthma  since childhood, but she was not having to frequently use her inhaler.  She was NYHA class I symptoms for the most part, occasionally class II.   Family history of heart disease.  It did sound like her brother had a cardiomyopathy for which he saw Leah Pierce.  She noted a strong family history of heart disease and wondered if some of her heart trouble could be genetic. We discussed in great detail the natural history of cardiomyopathy and the work-up involved, including initially ruling out ischemic heart disease and subsequently pursuing a valvular, postviral, or genetic cardiomyopathic process.  Office Visit 09/17/2021: She was feeling well overall with NYHA class I symptoms.  Continued to work at [employer] and stay active.  Coronary CTA performed to evaluate nature of cardiomyopathy minimal coronary artery disease.  Due to technical difficulties the mitral valve was incompletely assessed on cardiac CT despite my request for retrospective gating.  Nevertheless, report suggests and her mitral leaflet is thickened and possibly represents prior inflammatory pathology, mechanism of MR felt to be tethering and restricted movement of the posterior mitral leaflet in systole, Carpentier type IIIb.  We discussed that for full evaluation of the degree of mitral valve regurgitation, cardiac MRI may be the next best step.  This will help [place] understand the nature of her cardiomyopathy further given that it is nonischemic and if no cardiomyopathic process identified, we may be able to attribute her reduced ejection fraction to mitral valve regurgitation.  I have requested quantitation of the mitral valve regurgitation on MRI.  She is in agreement and willing  to proceed.  We reviewed that if mitral valve regurgitation is significant enough to cause her cardiomyopathy, the next step may be recommending that she have mitral valve replacement or repair, which may necessitate referral to [facility].  Office  visit 03/10/22: She saw Leah Pierce 10/2021, and spironolactone 12.5 mg was added to her regimen. She had presented to urgent care 12/10/2021 with left shoulder and LUE pain lasting for about 3 days, exacerbated by movement. She initially woke up with the pain, and denied any apparent injury. No suspicion for DVT, physical exam was consistent with muscular strain. Most recently she followed up with our pharmacist 01/02/2022 and was well; felt like she had no restrictions on her ability to move around.  Today she reports her blood pressure has been well controlled at home, and her weight is stable (129-134 lb).  She has been feeling well without any shortness of breath. Lately she has not needed to use her inhaler.  She remains active with working part-time at [employer]. If she works too long, she may become very fatigued. Generally she feels better the next day with a good night's sleep.   Past Medical History:  Diagnosis Date   Allergy    Arthritis    Asthma    hx of   Chicken pox    Heart murmur    Migraines    Stroke [facility])     Past Surgical History:  Procedure Laterality Date   BUBBLE STUDY  03/18/2021   Procedure: BUBBLE STUDY;  Surgeon: Lelon Perla, MD;  Location: [place];  Service: Cardiovascular;;   CESAREAN SECTION     COLONOSCOPY     TEE WITHOUT CARDIOVERSION N/A 03/18/2021   Procedure: TRANSESOPHAGEAL ECHOCARDIOGRAM (TEE);  Surgeon: Lelon Perla, MD;  Location: [facility] ENDOSCOPY;  Service: Cardiovascular;  Laterality: N/A;   TONSILLECTOMY  1959    Current Medications: Current Meds  Medication Sig   aspirin EC 81 MG tablet Take 1 tablet (81 mg total) by mouth daily. Swallow whole.   atorvastatin (LIPITOR) 20 MG tablet TAKE 1 TABLET BY MOUTH EVERY DAY   cholecalciferol (VITAMIN D3) 25 MCG (1000 UNIT) tablet Take 1,000 Units by mouth daily.   empagliflozin (JARDIANCE) 10 MG TABS tablet TAKE 1 TABLET BY MOUTH EVERY DAY BEFORE BREAKFAST   losartan (COZAAR) 25  MG tablet TAKE 1 TABLET (25 MG TOTAL) BY MOUTH DAILY.   metoprolol succinate (TOPROL-XL) 25 MG 24 hr tablet TAKE 0.5 TABLETS BY MOUTH DAILY. TAKE WITH OR IMMEDIATELY FOLLOWING A MEAL.   Misc Natural Products (OSTEO BI-FLEX ADV JOINT SHIELD) TABS Take 1 tablet by mouth daily.   OVER THE COUNTER MEDICATION Take 1 tablet by mouth daily. NEURIVA   spironolactone (ALDACTONE) 25 MG tablet Take 0.5 tablets (12.5 mg total) by mouth daily.   vitamin C (ASCORBIC ACID) 500 MG tablet Take 500 mg by mouth daily.   vitamin E 200 UNIT capsule Take 200 Units by mouth daily.     Allergies:   Tetracyclines & related   Social History   Tobacco Use   Smoking status: Never   Smokeless tobacco: Never  Vaping Use   Vaping Use: Never used  Substance Use Topics   Alcohol use: Yes    Comment: occasionally   Drug use: Yes    Frequency: 1.0 times per week    Types: Marijuana     Family History: The patient's family history includes Arthritis in her father and mother; Cancer in her brother, brother, and  father; Colon polyps in her mother; Diabetes in her mother; Hypertension in her mother; Stroke in her mother. There is no history of Colon cancer, Esophageal cancer, Rectal cancer, or Stomach cancer.  ROS:   Please see the history of present illness.    (+) Fatigue (+) Urinary frequency All other systems reviewed and are negative.  EKGs/Labs/Other Studies Reviewed:    The following studies were reviewed today:  Cardiac MRI 10/21/2021: IMPRESSION: 1. Normal LV size, mild hypertrophy, and mild systolic dysfunction (EF 31%). Apical hypertrabeculation.   2.  Normal RV size and systolic function (EF 49%)   3.  No late gadolinium enhancement to suggest myocardial scar   4. Moderate to severe mitral regurgitation (regurgitant fraction 39%)  Bilateral Carotid Duplex 09/18/2021: Summary:  Right Carotid: Velocities in the right ICA are consistent with a 1-39%  stenosis.   Left Carotid: Velocities in  the left ICA are consistent with a 1-39%  stenosis.   Cardiac CTA 09/05/2021:  IMPRESSION: 1. Coronary calcium score of 5.7. This was 57th percentile for age-, sex, and race-matched controls.   2. Normal coronary origin with right dominance.   3. Minimal CAD (<25%) in the LAD/RCA.   4. Non-ischemic cardiomyopathy.   5. Limited evaluation of the mitral valve as a prospective acquisition was obtained. The AMVL is thickened and possible represents prior inflammatory pathology. No prolapse noted. Current mechanism of MR is related to tethering and restricted movement of the PMVL in systole (IIIB).  Echo 08/06/21  1. Left ventricular ejection fraction, by estimation, is 35 to 40%. The  left ventricle has moderately decreased function. The left ventricle  demonstrates global hypokinesis. Left ventricular diastolic parameters are  indeterminate.   2. Right ventricular systolic function is normal. The right ventricular  size is normal.   3. Left atrial size was moderately dilated.   4. The mitral valve is normal in structure. Moderate to severe mitral  valve regurgitation. No evidence of mitral stenosis. There is mild  holosystolic prolapse of the middle segment of the anterior leaflet of the  mitral valve.   5. The aortic valve is normal in structure. Aortic valve regurgitation is  not visualized. No aortic stenosis is present.   6. The inferior vena Pierce is normal in size with greater than 50%  respiratory variability, suggesting right atrial pressure of 3 mmHg.   Monitor 04/24/21 IMPRESSION: No atrial fibrillation in setting of recent stroke.   Echo TEE 03/18/21 1. Left ventricular ejection fraction, by estimation, is 30 to 35%. The  left ventricle has moderately decreased function. The left ventricle  demonstrates global hypokinesis.   2. Right ventricular systolic function is normal. The right ventricular  size is normal.   3. Left atrial size was severely dilated. No left  atrial/left atrial  appendage thrombus was detected.   4. The mitral valve is abnormal. Moderate to severe mitral valve  regurgitation. There is mild late systolic prolapse of the middle segment  of the anterior leaflet of the mitral valve.   5. The aortic valve is tricuspid. Aortic valve regurgitation is not  visualized.   6. There is mild (Grade II) plaque involving the descending aorta.   7. Evidence of atrial level shunting detected by color flow Doppler.  Agitated saline contrast bubble study was positive with shunting observed  after >6 cardiac cycles suggestive of intrapulmonary shunting.   EKG:  EKG is personally reviewed. 09/01/22: NSR, T wave abnl inferolateral unchanged 03/10/2022: Sinus rhythm. LAE. Nonspecific T wave  abnormality. 08/27/21: NSR, possible LAE, nonspecific T wave abnl.  Imaging studies that I have independently reviewed today: TTE 08/06/21 and TEE 03/18/21, CCTA 09/05/21  Recent Labs: 10/16/2021: Hemoglobin 14.0; Platelets 218 12/27/2021: BUN 18; Creatinine, Ser 0.82; Potassium 4.5; Sodium 141   Recent Lipid Panel    Component Value Date/Time   CHOL 154 06/06/2021 0821   TRIG 53 06/06/2021 0821   HDL 83 06/06/2021 0821   CHOLHDL 1.9 06/06/2021 0821   CHOLHDL 2.7 03/16/2021 0548   VLDL 14 03/16/2021 0548   LDLCALC 60 06/06/2021 0821    Physical Exam:    VS:  BP 102/64 (BP Location: Left Arm, Patient Position: Sitting, Cuff Size: Normal)   Pulse 62   Ht 5' 3"  (1.6 m)   Wt 134 lb 12.8 oz (61.1 kg)   SpO2 99%   BMI 23.88 kg/m     Wt Readings from Last 5 Encounters:  09/04/22 134 lb (60.8 kg)  09/01/22 134 lb 12.8 oz (61.1 kg)  07/24/22 130 lb (59 kg)  05/19/22 130 lb 8 oz (59.2 kg)  05/12/22 135 lb 9.6 oz (61.5 kg)    Constitutional: No acute distress Eyes: sclera non-icteric, normal conjunctiva and lids ENMT: normal dentition, moist mucous membranes Cardiovascular: regular rhythm, normal rate, 1/6 HSM. S1 and S2 normal. No jugular venous  distention.  Respiratory: clear to auscultation bilaterally GI : normal bowel sounds, soft and nontender. No distention.   MSK: extremities warm, well perfused.  Trace bilateral edema.  NEURO: grossly nonfocal exam, moves all extremities. PSYCH: alert and oriented x 3, normal mood and affect.   ASSESSMENT:    1. Mitral valve prolapse   2. Nonrheumatic mitral valve regurgitation   3. Chronic combined systolic (congestive) and diastolic (congestive) heart failure (HCC)   4. HFrEF (heart failure with reduced ejection fraction) ([place])   5. Hyperlipidemia LDL goal <70   6. Essential (primary) hypertension   7. Medication management   8. Cerebrovascular accident (CVA) due to embolism of left carotid artery ([place])   9. Bilateral carotid artery stenosis     PLAN:    Chronic combined systolic and diastolic CHF (congestive heart failure) (HCC) -  HFrEF (heart failure with reduced ejection fraction) ([place]) - Medication management-  -Coronary CTA performed with minimal CAD.  Cardiac MRI showed Mild nonischemic LV dysfunction and moderate-severe MR. Surprisingly, no significant murmur on exam, suspect very eccentric jet. Heart Failure Therapy - continue at current doses ACE-I/ARB/ARNI: losartan 25 mg daily BB: metoprolol succinate 12.5 mg daily MRA: spironolactone 12.5 mg daily SGLT2I: jardience 10 mg daily Diuretic plan: no loop diuretic required. NYHA class I symptoms.  Nonrheumatic mitral valve regurgitation, mod severe by quantitation-  -Moderate to severe mitral valve regurgitation.  Mixed etiology.  Will follow with echo in 6 mo. With reduced LV function, may be reasonable to pursue surgical consultation for opinion after next follow up. Unsure if valve is repairable given abnormal appearance.  Hyperlipidemia LDL goal <70 -Most recent LDL is less than 70, continue atorvastatin 20 mg daily. Minimal CAD.  Cerebrovascular accident (CVA) due to embolism of left carotid artery  ([place]) -Followed closely by neurology.  Agreed that Eliquis no longer indicated from cardiac standpoint.  Continue aspirin 81 mg daily.  Essential (primary) hypertension-blood pressure is low normal and meds have been well titrated.   Total time of encounter: 30 minutes total time of encounter, including 20 minutes spent in face-to-face patient care on the date of this encounter. This time includes coordination  of care and counseling regarding above mentioned problem list. Remainder of non-face-to-face time involved reviewing chart documents/testing relevant to the patient encounter and documentation in the medical record. I have independently reviewed documentation from referring provider.   Cherlynn Kaiser, MD, [place]    Shared Decision Making/Informed Consent:       Medication Adjustments/Labs and Tests Ordered: Current medicines are reviewed at length with the patient today.  Concerns regarding medicines are outlined above.   Orders Placed This Encounter  Procedures   EKG 12-Lead   ECHOCARDIOGRAM COMPLETE   No orders of the defined types were placed in this encounter.  Patient Instructions  Medication Instructions:  The current medical regimen is effective;  continue present plan and medications.  *If you need a refill on your cardiac medications before your next appointment, please call your pharmacy*   Testing/Procedures: Echocardiogram (6 months)  - Your physician has requested that you have an echocardiogram. Echocardiography is a painless test that uses sound waves to create images of your heart. It provides your doctor with information about the size and shape of your heart and how well your heart's chambers and valves are working. This procedure takes approximately one hour. There are no restrictions for this procedure.     Follow-Up: At [facility], you and your health needs are our priority.  As part of our continuing mission to  provide you with exceptional heart care, we have created designated Provider Care Teams.  These Care Teams include your primary Cardiologist (physician) and Advanced Practice Providers (APPs -  Physician Assistants and Nurse Practitioners) who all work together to provide you with the care you need, when you need it.  We recommend signing up for the patient portal called "MyChart".  Sign up information is provided on this After Visit Summary.  MyChart is used to connect with patients for Virtual Visits (Telemedicine).  Patients are able to view lab/test results, encounter notes, upcoming appointments, etc.  Non-urgent messages can be sent to your provider as well.   To learn more about what you can do with MyChart, go to [email or web address].    Your next appointment:   6 month(s)  The format for your next appointment:   In Person  Provider:   Elouise Munroe, MD

## 2022-09-01 NOTE — Patient Instructions (Signed)
Medication Instructions:  The current medical regimen is effective;  continue present plan and medications.  *If you need a refill on your cardiac medications before your next appointment, please call your pharmacy*   Testing/Procedures: Echocardiogram (6 months)  - Your physician has requested that you have an echocardiogram. Echocardiography is a painless test that uses sound waves to create images of your heart. It provides your doctor with information about the size and shape of your heart and how well your heart's chambers and valves are working. This procedure takes approximately one hour. There are no restrictions for this procedure.     Follow-Up: At [facility], you and your health needs are our priority.  As part of our continuing mission to provide you with exceptional heart care, we have created designated Provider Care Teams.  These Care Teams include your primary Cardiologist (physician) and Advanced Practice Providers (APPs -  Physician Assistants and Nurse Practitioners) who all work together to provide you with the care you need, when you need it.  We recommend signing up for the patient portal called "MyChart".  Sign up information is provided on this After Visit Summary.  MyChart is used to connect with patients for Virtual Visits (Telemedicine).  Patients are able to view lab/test results, encounter notes, upcoming appointments, etc.  Non-urgent messages can be sent to your provider as well.   To learn more about what you can do with MyChart, go to [email or web address].    Your next appointment:   6 month(s)  The format for your next appointment:   In Person  Provider:   Elouise Munroe, MD

## 2022-09-03 NOTE — Progress Notes (Signed)
[facility] [address] [place]. [place] [address] [phone]       OFFICE CONSULT NOTE  Leah Pierce Date of Birth:  February 19, 1951 Medical Record Number:  [id number]   Referring MD: Leah Pierce  Reason for Referral: Stroke  No chief complaint on file.    HPI:   Update 09/04/2022 JM: Patient returns for yearly stroke follow-up.  Overall stable without new or reoccurring stroke/TIA symptoms.  Remains on aspirin and atorvastatin.  Blood pressure well controlled.  Prior 2D echo showed improvement of EF currently at 45 to 50% in 04/2022.  She routinely follows with cardiology as well as PCP.       History provided for reference purposes only Update 09/04/2021 JM: Returns for 66-month stroke follow-up unaccompanied.  Overall doing well without new or reoccurring stroke/TIA symptoms.  Repeat 2D echo 08/06/2021 showed EF 35 to 40% showing improvement of prior EF at 30 to 35%.  She has remained on Eliquis and her recent cardiology visit, "no strong indication to continue Eliquis from a cardiovascular standpoint if neurology feels to be able to transition to aspirin 81 mg daily monotherapy".  She is currently undergoing ischemic cardiac work-up for unexplained cardiomyopathy.  Remains on atorvastatin 20 mg daily without side effects.  Blood pressure today 124/78. Monitors at home and has been stable.  No new concerns at this time.  Initial visit 05/28/2021 Dr. Pearlean Pierce: Leah Pierce is a 71 year old African-American lady seen today for initial office consultation visit for stroke.  History is obtained from the patient and review of electronic medical records and I personally reviewed available imaging films in PACS. She has past medical history of prediabetes, hyperlipidemia, migraines, asthma who presented on 03/16/2021 with sudden onset of aphasia and mild right facial droop as well as right leg drift and some confusion.  CTA head was unremarkable but CT angiogram revealed  terminal left ICA occlusion extending proximally into the left A1 segment of the ACA and the M1 segment of the middle cerebral artery.  She was treated with IV tPA and showed significant improvement and mechanical thrombectomy was not done.  She was kept in the ICU and blood pressure was tightly controlled.  MRI scan showed 3 tiny punctate acute infarcts in the left MCA territory involving deep insular cortex and white matter and corona radiator and left parietal cortex.  Transthoracic echo showed diminished ejection fraction of 30 to 35% which was felt to be new.  TEE showed moderate to severe global reduction in LV systolic function with severe left atrial enlargement but no left atrial thrombus and no PFO however there were delayed bubbles suggestive of intrapulmonary shunt.  Lower extremity venous Dopplers were negative for DVT.  LDL cholesterol is 1 1 8  mg percent and hemoglobin A1c was 5.9.  Urine drug screen was positive for cannabis.  Patient was seen by cardiology who recommended metoprolol and Entresto and anticoagulation with Eliquis for 3 months with repeat echo.  In 3 months.  Patient states she is done extremely well since discharge.  She has had no residual neurological deficits or facial droop is resolved completely.  She is tolerating Eliquis well with only minor bruising and no bleeding.  She had a 30-day outpatient heart monitor which was negative for atrial fibrillation.  She is returned back to baseline and has no complaints.  She has a follow-up echocardiogram and cardiology appointment planned for August.  Her blood pressure at home is well controlled today it is elevated in  office at 150/96.  She is tolerating Lipitor well without muscle aches and pains.  ROS:   14 system review of systems is positive for no complaints and all other systems are negative   PMH:  Past Medical History:  Diagnosis Date   Allergy    Arthritis    Asthma    hx of   Chicken pox    Heart murmur     Migraines    Stroke [facility])     Social History:  Social History   Socioeconomic History   Marital status: Single    Spouse name: Not on file   Number of children: 1   Years of education: Not on file   Highest education level: Not on file  Occupational History   Not on file  Tobacco Use   Smoking status: Never   Smokeless tobacco: Never  Vaping Use   Vaping Use: Never used  Substance and Sexual Activity   Alcohol use: Yes    Comment: occasionally   Drug use: Yes    Frequency: 1.0 times per week    Types: Marijuana   Sexual activity: Not on file  Other Topics Concern   Not on file  Social History Narrative   Lives alone, her son dies   Right Handed   Drinks 1 cups caffeine daily   Social Determinants of Health   Financial Resource Strain: Low Risk  (07/24/2022)   Overall Financial Resource Strain (CARDIA)    Difficulty of Paying Living Expenses: Not hard at all  Food Insecurity: No Food Insecurity (07/24/2022)   Hunger Vital Sign    Worried About Running Out of Food in the Last Year: Never true    Ran Out of Food in the Last Year: Never true  Transportation Needs: No Transportation Needs (07/24/2022)   PRAPARE - Administrator, Civil Service (Medical): No    Lack of Transportation (Non-Medical): No  Physical Activity: Inactive (07/24/2022)   Exercise Vital Sign    Days of Exercise per Week: 0 days    Minutes of Exercise per Session: 0 min  Stress: No Stress Concern Present (07/24/2022)   [employer] of Occupational Health - Occupational Stress Questionnaire    Feeling of Stress : Not at all  Social Connections: Moderately Integrated (07/24/2022)   Social Connection and Isolation Panel [NHANES]    Frequency of Communication with Friends and Family: More than three times a week    Frequency of Social Gatherings with Friends and Family: More than three times a week    Attends Religious Services: More than 4 times per year    Active Member of [employer] or  Organizations: Yes    Attends Engineer, structural: More than 4 times per year    Marital Status: Divorced  Intimate Partner Violence: Not At Risk (07/24/2022)   Humiliation, Afraid, Rape, and Kick questionnaire    Fear of Current or Ex-Partner: No    Emotionally Abused: No    Physically Abused: No    Sexually Abused: No    Medications:   Current Outpatient Medications on File Prior to Visit  Medication Sig Dispense Refill   albuterol (VENTOLIN HFA) 108 (90 Base) MCG/ACT inhaler Inhale 2 puffs into the lungs every 6 (six) hours as needed for wheezing or shortness of breath. (Patient not taking: Reported on 09/01/2022) 18 g 3   aspirin EC 81 MG tablet Take 1 tablet (81 mg total) by mouth daily. Swallow whole. 30 tablet 11  atorvastatin (LIPITOR) 20 MG tablet TAKE 1 TABLET BY MOUTH EVERY DAY 90 tablet 1   cholecalciferol (VITAMIN D3) 25 MCG (1000 UNIT) tablet Take 1,000 Units by mouth daily.     empagliflozin (JARDIANCE) 10 MG TABS tablet TAKE 1 TABLET BY MOUTH EVERY DAY BEFORE BREAKFAST 90 tablet 2   fluticasone (FLOVENT HFA) 110 MCG/ACT inhaler Inhale 1 puff into the lungs in the morning and at bedtime. (Patient not taking: Reported on 09/01/2022) 1 Inhaler 3   losartan (COZAAR) 25 MG tablet TAKE 1 TABLET (25 MG TOTAL) BY MOUTH DAILY. 90 tablet 2   metoprolol succinate (TOPROL-XL) 25 MG 24 hr tablet TAKE 0.5 TABLETS BY MOUTH DAILY. TAKE WITH OR IMMEDIATELY FOLLOWING A MEAL. 45 tablet 1   Misc Natural Products (OSTEO BI-FLEX ADV JOINT SHIELD) TABS Take 1 tablet by mouth daily.     OVER THE COUNTER MEDICATION Take 1 tablet by mouth daily. NEURIVA     Spacer/Aero-Holding Chambers (AEROCHAMBER PLUS) inhaler Use as instructed to use with inahaler. (Patient not taking: Reported on 09/01/2022) 1 each 1   spironolactone (ALDACTONE) 25 MG tablet Take 0.5 tablets (12.5 mg total) by mouth daily. 45 tablet 1   trimethoprim-polymyxin b (POLYTRIM) ophthalmic solution Place 2 drops into both eyes  every 4 hours while awake (Patient not taking: Reported on 09/01/2022) 10 mL 0   vitamin C (ASCORBIC ACID) 500 MG tablet Take 500 mg by mouth daily.     vitamin E 200 UNIT capsule Take 200 Units by mouth daily.     No current facility-administered medications on file prior to visit.    Allergies:   Allergies  Allergen Reactions   Tetracyclines & Related     Rash on hands    Physical Exam There were no vitals filed for this visit.  There is no height or weight on file to calculate BMI.   General: Pleasant middle-age lady d, seated, in no evident distress Head: head normocephalic and atraumatic.   Neck: supple with no carotid or supraclavicular bruits Cardiovascular: regular rate and rhythm, no murmurs Musculoskeletal: no deformity Skin:  no rash/petichiae Vascular:  Normal pulses all extremities  Neurologic Exam Mental Status: Awake and fully alert.  Fluent speech and language.  Oriented to place and time. Recent and remote memory intact. Attention span, concentration and fund of knowledge appropriate. Mood and affect appropriate.  Cranial Nerves: Pupils equal, briskly reactive to light. Extraocular movements full without nystagmus. Visual fields full to confrontation. Hearing intact. Facial sensation intact. Face, tongue, palate moves normally and symmetrically.  Motor: Normal bulk and tone. Normal strength in all tested extremity muscles. Sensory.: intact to touch , pinprick , position and vibratory sensation.  Coordination: Rapid alternating movements normal in all extremities. Finger-to-nose and heel-to-shin performed accurately bilaterally. Gait and Station: Arises from chair without difficulty. Stance is normal. Gait demonstrates normal stride length and balance . Able to heel, toe and tandem walk without difficulty.  Reflexes: 1+ and symmetric. Toes downgoing.      ASSESSMENT: 71 year old lady with left MCA tiny branch infarct and April 2022 secondary to left carotid  occlusion with distal embolization treated with IV tPA with excellent clinical recovery.  Vascular risk factors of carotid occlusion, cardiomyopathy, , hypertension and hyperlipidemia.     Pierce:  -Continue aspirin 81 mg daily and atorvastatin 5 mg daily for secondary stroke prevention measures.  -Previously on Eliquis but has since discontinued as improvement of EF with most recent 45 to 50% (04/2022) -Carotid duplex 09/2021 showed  bilateral ICA 1 to 39% stenosis - maintain strict control of hypertension with blood pressure goal below 130/90, diabetes with hemoglobin A1c goal below 6.5% and lipids with LDL cholesterol goal below 70 mg/dL.  -continue to follow with cardiology for further ischemic cardiac work up for unexplained cardiomyopathy     Follow-up in 1 year or call earlier if needed   CC:  [place], Timoteo Expose, MD   I spent 32 minutes of face-to-face and non-face-to-face time with patient.  This included previsit chart review, lab review, study review, order entry, electronic health record documentation, patient education and discussion regarding history of prior stroke and etiology, secondary stroke prevention measures and aggressive stroke risk factor management, indication to stop Eliquis and initiate aspirin and answered all the questions to patient's satisfaction  Ihor Austin, [facility]  [facility] [address] [place], [place] [address]  Phone [phone] Fax [phone] Note: This document was prepared with digital dictation and possible smart phrase technology. Any transcriptional errors that result from this process are unintentional.

## 2022-09-04 ENCOUNTER — Encounter: Payer: Self-pay | Admitting: Adult Health

## 2022-09-04 ENCOUNTER — Ambulatory Visit (INDEPENDENT_AMBULATORY_CARE_PROVIDER_SITE_OTHER): Payer: Medicare Other | Admitting: Adult Health

## 2022-09-04 VITALS — BP 116/69 | HR 67 | Ht 63.0 in | Wt 134.0 lb

## 2022-09-04 DIAGNOSIS — I63132 Cerebral infarction due to embolism of left carotid artery: Secondary | ICD-10-CM | POA: Diagnosis not present

## 2022-09-04 NOTE — Patient Instructions (Signed)
Continue aspirin 81 mg daily  and atorvastatin for secondary stroke prevention  Continue to follow with cardiology as scheduled  Continue to follow up with PCP regarding blood pressure and cholesterol management  Maintain strict control of hypertension with blood pressure goal below 130/90 and cholesterol with LDL cholesterol (bad cholesterol) goal below 70 mg/dL.   Signs of a Stroke? Follow the BEFAST method:  Balance Watch for a sudden loss of balance, trouble with coordination or vertigo Eyes Is there a sudden loss of vision in one or both eyes? Or double vision?  Face: Ask the person to smile. Does one side of the face droop or is it numb?  Arms: Ask the person to raise both arms. Does one arm drift downward? Is there weakness or numbness of a leg? Speech: Ask the person to repeat a simple phrase. Does the speech sound slurred/strange? Is the person confused ? Time: If you observe any of these signs, call 911.       Thank you for coming to see [place] at [facility]. I hope we have been able to provide you high quality care today.  You may receive a patient satisfaction survey over the next few weeks. We would appreciate your feedback and comments so that we may continue to improve ourselves and the health of our patients.

## 2023-01-28 ENCOUNTER — Other Ambulatory Visit: Payer: Self-pay | Admitting: Family Medicine

## 2023-01-28 ENCOUNTER — Other Ambulatory Visit: Payer: Self-pay | Admitting: Internal Medicine

## 2023-02-09 ENCOUNTER — Telehealth: Payer: Self-pay | Admitting: Internal Medicine

## 2023-02-09 ENCOUNTER — Other Ambulatory Visit: Payer: Self-pay

## 2023-02-09 DIAGNOSIS — E785 Hyperlipidemia, unspecified: Secondary | ICD-10-CM

## 2023-02-09 MED ORDER — ATORVASTATIN CALCIUM 20 MG PO TABS: 20.0000 mg | ORAL_TABLET | Freq: Every day | ORAL | 1 refills | Status: DC

## 2023-02-09 NOTE — Telephone Encounter (Signed)
*  STAT* If patient is at the pharmacy, call can be transferred to refill team.   1. Which medications need to be refilled? (please list name of each medication and dose if known) atorvastatin (LIPITOR) 20 MG tablet   2. Which pharmacy/location (including street and city if local pharmacy) is medication to be sent to?  [id number] - [facility],  - [address]    3. Do they need a 30 day or 90 day supply? 90 day supply

## 2023-02-09 NOTE — Telephone Encounter (Signed)
Please disregard.. Pt realized she was calling the wrong provider.

## 2023-03-02 ENCOUNTER — Ambulatory Visit (HOSPITAL_COMMUNITY): Payer: 59 | Attending: Cardiology

## 2023-03-02 DIAGNOSIS — I341 Nonrheumatic mitral (valve) prolapse: Secondary | ICD-10-CM | POA: Insufficient documentation

## 2023-03-02 LAB — ECHOCARDIOGRAM COMPLETE
Area-P 1/2: 3.56 cm2
MV M vel: 5.4 m/s
MV Peak grad: 116.6 mmHg
Radius: 0.2 cm
S' Lateral: 3.4 cm

## 2023-03-13 ENCOUNTER — Encounter: Payer: Self-pay | Admitting: Internal Medicine

## 2023-03-13 ENCOUNTER — Ambulatory Visit: Payer: 59 | Attending: Internal Medicine | Admitting: Internal Medicine

## 2023-03-13 VITALS — BP 128/78 | HR 68 | Ht 62.0 in | Wt 135.2 lb

## 2023-03-13 DIAGNOSIS — I341 Nonrheumatic mitral (valve) prolapse: Secondary | ICD-10-CM | POA: Diagnosis not present

## 2023-03-13 DIAGNOSIS — I6523 Occlusion and stenosis of bilateral carotid arteries: Secondary | ICD-10-CM

## 2023-03-13 DIAGNOSIS — E785 Hyperlipidemia, unspecified: Secondary | ICD-10-CM | POA: Diagnosis not present

## 2023-03-13 DIAGNOSIS — I63132 Cerebral infarction due to embolism of left carotid artery: Secondary | ICD-10-CM

## 2023-03-13 DIAGNOSIS — I10 Essential (primary) hypertension: Secondary | ICD-10-CM

## 2023-03-13 DIAGNOSIS — I5042 Chronic combined systolic (congestive) and diastolic (congestive) heart failure: Secondary | ICD-10-CM | POA: Diagnosis not present

## 2023-03-13 DIAGNOSIS — I5020 Unspecified systolic (congestive) heart failure: Secondary | ICD-10-CM

## 2023-03-13 DIAGNOSIS — I340 Nonrheumatic mitral (valve) insufficiency: Secondary | ICD-10-CM | POA: Diagnosis not present

## 2023-03-13 NOTE — Progress Notes (Signed)
Cardiology Office Note:    Date:  03/13/2023  ID:  Leah Pierce, DOB [birth year], MRN [id number]  PCP:  [place], Betty G, MD  Cardiologist:  Parke Poisson, MD  Electrophysiologist:  None   Referring MD: [place], Betty G, MD   Chief Complaint/Reason for Referral: MV disease, stroke, HFrEF  History of Present Illness:    Leah Pierce is a 72 y.o. female with a history of acute systolic HF, mitral valve regurgitation with abnormal appearing mitral valve, CVA, HLD, prediabetes, asthma. Presents for follow up.   I initially met the patient when she was admitted for stroke and was noted to have a new cardiomyopathy and abnormal mitral valve.  She had a viral infection in the first part of April 2022, preceding stroke and HF diagnosis. Unclear if CM represents viral etiology.   Reviewed TTE from 08/06/21 and TEE from 03/18/21. MR is at least moderate severe. Mechanism appears mixed with some primary leaflet dysfunction of the anterior leaflet, and secondary MR with leaflet restriction, quite notable with the secondary chords attaching to the anterior leaflet. EF appears 40% on most recent TTE.   Historically she reported occasional paroxysms of shortness of breath for which she used her rescue inhaler prescribed by Dr. [place].  She does have a diagnosis of asthma since childhood, but she was not having to frequently use her inhaler.  She was NYHA class I symptoms for the most part, occasionally class II.   Family history of heart disease.  It did sound like her brother had a cardiomyopathy for which he saw Dr. Gala Romney.  She noted a strong family history of heart disease and wondered if some of her heart trouble could be genetic. We discussed in great detail the natural history of cardiomyopathy and the work-up involved, including initially ruling out ischemic heart disease and subsequently pursuing a valvular, postviral, or genetic cardiomyopathic process.  At her 09/2021 visit,  she was feeling well overall with NYHA class I symptoms.  Continued to work at [employer] and stay active.  Coronary CTA performed to evaluate nature of cardiomyopathy minimal coronary artery disease.  Due to technical difficulties the mitral valve was incompletely assessed on cardiac CT despite my request for retrospective gating.  Nevertheless, report suggests and her mitral leaflet is thickened and possibly represents prior inflammatory pathology, mechanism of MR felt to be tethering and restricted movement of the posterior mitral leaflet in systole, Carpentier type IIIb.  She saw Dr. Bjorn Pippin 10/2021, and spironolactone 12.5 mg was added to her regimen. She had presented to urgent care 12/10/2021 with left shoulder and LUE pain lasting for about 3 days, exacerbated by movement. She initially woke up with the pain, and denied any apparent injury. No suspicion for DVT, physical exam was consistent with muscular strain. She followed up with our pharmacist 01/02/2022 and was well; felt like she had no restrictions on her ability to move around.  At her visit 03/10/22, she reported well controlled home blood pressures. Her weight was stable (129-134 lb). Coronary CTA performed with minimal CAD.  Cardiac MRI showed Mild nonischemic LV dysfunction and moderate-severe MR. Surprisingly, no significant murmur on exam, suspect very eccentric jet. Planned to follow with echo in 6 months. LDL was less than 70, continue atorvastatin 20 mg daily. Minimal CAD. Cerebrovascular accident (CVA) due to embolism of left carotid artery followed closely by neurology.  Agreed that Eliquis was no longer indicated from cardiac standpoint.  Continued aspirin 81 mg daily.  On  09/01/22 she was doing really well. Continued to work without symptoms. NYHA class I sx. Reviewed recent echo with stable mod-severe MR. I suspected the valve will need to be fixed eventually. Mild LA dilation. LV normal size but reduced EF by echo and MRI. No  arrhythmia/afib on monitor. Stroke was felt to be to large vessel disease vs cardiomyopathy.   Today, she presents a blood pressure log which is personally reviewed. Over the past 3 months her readings are primarily 120s-130s/70s-80s. Heart rate mostly averaging in the 60s. Her weight has been stable.  When she is very active (working more part time hours) and feeling extremely tired, she may feel palpitations. At those times she is more aware of feeling her heart pumping harder than usual. She denies any associated chest pain or shortness of breath.  She denies any recent changes to her medications.  She denies any peripheral edema. No lightheadedness, headaches, syncope, orthopnea, or PND.  Past Medical History:  Diagnosis Date   Allergy    Arthritis    Asthma    hx of   Chicken pox    Heart murmur    Migraines    Stroke [facility])     Past Surgical History:  Procedure Laterality Date   BUBBLE STUDY  03/18/2021   Procedure: BUBBLE STUDY;  Surgeon: Lewayne Bunting, MD;  Location: [facility] ENDOSCOPY;  Service: Cardiovascular;;   CESAREAN SECTION     COLONOSCOPY     TEE WITHOUT CARDIOVERSION N/A 03/18/2021   Procedure: TRANSESOPHAGEAL ECHOCARDIOGRAM (TEE);  Surgeon: Lewayne Bunting, MD;  Location: [facility] ENDOSCOPY;  Service: Cardiovascular;  Laterality: N/A;   TONSILLECTOMY  1959    Current Medications: Current Meds  Medication Sig   albuterol (VENTOLIN HFA) 108 (90 Base) MCG/ACT inhaler Inhale 2 puffs into the lungs every 6 (six) hours as needed for wheezing or shortness of breath.   aspirin EC 81 MG tablet Take 1 tablet (81 mg total) by mouth daily. Swallow whole.   atorvastatin (LIPITOR) 20 MG tablet Take 1 tablet (20 mg total) by mouth daily.   cholecalciferol (VITAMIN D3) 25 MCG (1000 UNIT) tablet Take 1,000 Units by mouth daily.   empagliflozin (JARDIANCE) 10 MG TABS tablet TAKE 1 TABLET BY MOUTH EVERY DAY BEFORE BREAKFAST   fluticasone (FLOVENT HFA) 110 MCG/ACT inhaler Inhale 1 puff  into the lungs in the morning and at bedtime.   losartan (COZAAR) 25 MG tablet TAKE 1 TABLET (25 MG TOTAL) BY MOUTH DAILY.   metoprolol succinate (TOPROL-XL) 25 MG 24 hr tablet TAKE 0.5 TABLETS BY MOUTH DAILY. TAKE WITH OR IMMEDIATELY FOLLOWING A MEAL.   Misc Natural Products (OSTEO BI-FLEX ADV JOINT SHIELD) TABS Take 1 tablet by mouth daily.   OVER THE COUNTER MEDICATION Take 1 tablet by mouth daily. NEURIVA   Spacer/Aero-Holding Chambers (AEROCHAMBER PLUS) inhaler Use as instructed to use with inahaler.   spironolactone (ALDACTONE) 25 MG tablet TAKE 1/2 TABLET BY MOUTH EVERY DAY   trimethoprim-polymyxin b (POLYTRIM) ophthalmic solution Place 2 drops into both eyes every 4 hours while awake   vitamin C (ASCORBIC ACID) 500 MG tablet Take 500 mg by mouth daily.   vitamin E 200 UNIT capsule Take 200 Units by mouth daily.     Allergies:   Tetracyclines & related   Social History   Tobacco Use   Smoking status: Never   Smokeless tobacco: Never  Vaping Use   Vaping Use: Never used  Substance Use Topics   Alcohol use: Yes  Comment: occasionally   Drug use: Yes    Frequency: 1.0 times per week    Types: Marijuana     Family History: The patient's family history includes Arthritis in her father and mother; Cancer in her brother, brother, and father; Colon polyps in her mother; Diabetes in her mother; Hypertension in her mother; Stroke in her mother. There is no history of Colon cancer, Esophageal cancer, Rectal cancer, or Stomach cancer.  ROS:   Please see the history of present illness.    (+) Intermittent palpitations All other systems reviewed and are negative.  EKGs/Labs/Other Studies Reviewed:    The following studies were reviewed today:  Echocardiogram  03/02/2023:  1. Left ventricular ejection fraction, by estimation, is 45 to 50%. The  left ventricle has mildly decreased function. The left ventricle  demonstrates global hypokinesis. Left ventricular diastolic parameters   were normal.   2. Right ventricular systolic function is normal. The right ventricular  size is normal.   3. The mitral valve is myxomatous. Moderate mitral valve regurgitation.  No evidence of mitral stenosis. There is mild holosystolic prolapse of the  middle segment of the anterior leaflet of the mitral valve.   4. The aortic valve is tricuspid. Aortic valve regurgitation is not  visualized. No aortic stenosis is present.   5. The inferior vena cava is normal in size with greater than 50%  respiratory variability, suggesting right atrial pressure of 3 mmHg.   Comparison(s): No significant change from prior study. Prior images  reviewed side by side.    Cardiac MRI 10/21/2021: IMPRESSION: 1. Normal LV size, mild hypertrophy, and mild systolic dysfunction (EF 45%). Apical hypertrabeculation.   2.  Normal RV size and systolic function (EF 52%)   3.  No late gadolinium enhancement to suggest myocardial scar   4. Moderate to severe mitral regurgitation (regurgitant fraction 39%)  Bilateral Carotid Duplex 09/18/2021: Summary:  Right Carotid: Velocities in the right ICA are consistent with a 1-39%  stenosis.   Left Carotid: Velocities in the left ICA are consistent with a 1-39%  stenosis.   Cardiac CTA 09/05/2021:  IMPRESSION: 1. Coronary calcium score of 5.7. This was 57th percentile for age-, sex, and race-matched controls.   2. Normal coronary origin with right dominance.   3. Minimal CAD (<25%) in the LAD/RCA.   4. Non-ischemic cardiomyopathy.   5. Limited evaluation of the mitral valve as a prospective acquisition was obtained. The AMVL is thickened and possible represents prior inflammatory pathology. No prolapse noted. Current mechanism of MR is related to tethering and restricted movement of the PMVL in systole (IIIB).  Echo 08/06/21  1. Left ventricular ejection fraction, by estimation, is 35 to 40%. The  left ventricle has moderately decreased function. The  left ventricle  demonstrates global hypokinesis. Left ventricular diastolic parameters are  indeterminate.   2. Right ventricular systolic function is normal. The right ventricular  size is normal.   3. Left atrial size was moderately dilated.   4. The mitral valve is normal in structure. Moderate to severe mitral  valve regurgitation. No evidence of mitral stenosis. There is mild  holosystolic prolapse of the middle segment of the anterior leaflet of the  mitral valve.   5. The aortic valve is normal in structure. Aortic valve regurgitation is  not visualized. No aortic stenosis is present.   6. The inferior vena cava is normal in size with greater than 50%  respiratory variability, suggesting right atrial pressure of 3 mmHg.  Monitor 04/24/21 IMPRESSION: No atrial fibrillation in setting of recent stroke.   Echo TEE 03/18/21 1. Left ventricular ejection fraction, by estimation, is 30 to 35%. The  left ventricle has moderately decreased function. The left ventricle  demonstrates global hypokinesis.   2. Right ventricular systolic function is normal. The right ventricular  size is normal.   3. Left atrial size was severely dilated. No left atrial/left atrial  appendage thrombus was detected.   4. The mitral valve is abnormal. Moderate to severe mitral valve  regurgitation. There is mild late systolic prolapse of the middle segment  of the anterior leaflet of the mitral valve.   5. The aortic valve is tricuspid. Aortic valve regurgitation is not  visualized.   6. There is mild (Grade II) plaque involving the descending aorta.   7. Evidence of atrial level shunting detected by color flow Doppler.  Agitated saline contrast bubble study was positive with shunting observed  after >6 cardiac cycles suggestive of intrapulmonary shunting.   EKG:  EKG is personally reviewed. 03/13/2023:  Absent V3 due to poor data quality. Sinus rhythm. Nonspecific T wave abnormality. Unchanged from  prior. 09/01/22: NSR, T wave abnl inferolateral unchanged 03/10/2022: Sinus rhythm. LAE. Nonspecific T wave abnormality. 08/27/21: NSR, possible LAE, nonspecific T wave abnl.  Imaging studies that I have independently reviewed today: TTE 08/06/21 and TEE 03/18/21, CCTA 09/05/21  Recent Labs: No results found for requested labs within last 365 days.   Recent Lipid Panel    Component Value Date/Time   CHOL 154 06/06/2021 0821   TRIG 53 06/06/2021 0821   HDL 83 06/06/2021 0821   CHOLHDL 1.9 06/06/2021 0821   CHOLHDL 2.7 03/16/2021 0548   VLDL 14 03/16/2021 0548   LDLCALC 60 06/06/2021 0821    Physical Exam:    VS:  BP 128/78   Pulse 68   Ht 5\' 2"  (1.575 m)   Wt 135 lb 3.2 oz (61.3 kg)   SpO2 94%   BMI 24.73 kg/m     Wt Readings from Last 5 Encounters:  03/13/23 135 lb 3.2 oz (61.3 kg)  09/04/22 134 lb (60.8 kg)  09/01/22 134 lb 12.8 oz (61.1 kg)  07/24/22 130 lb (59 kg)  05/19/22 130 lb 8 oz (59.2 kg)    Constitutional: No acute distress Eyes: sclera non-icteric, normal conjunctiva and lids ENMT: normal dentition, moist mucous membranes Cardiovascular: regular rhythm, normal rate, 1/6 HSM. S1 and S2 normal. No jugular venous distention.  Respiratory: clear to auscultation bilaterally GI : normal bowel sounds, soft and nontender. No distention.   MSK: extremities warm, well perfused. No edema.  NEURO: grossly nonfocal exam, moves all extremities. PSYCH: alert and oriented x 3, normal mood and affect.   ASSESSMENT:    1. Chronic combined systolic (congestive) and diastolic (congestive) heart failure (HCC)   2. Mitral valve prolapse   3. Nonrheumatic mitral valve regurgitation   4. HFrEF (heart failure with reduced ejection fraction) (HCC)   5. Essential (primary) hypertension   6. Hyperlipidemia LDL goal <70   7. Cerebrovascular accident (CVA) due to embolism of left carotid artery (HCC)   8. Bilateral carotid artery stenosis     PLAN:    Chronic combined  systolic and diastolic CHF (congestive heart failure) (HCC) -  HFrEF (heart failure with reduced ejection fraction) (HCC) - Medication management-  -Coronary CTA performed with minimal CAD.  Cardiac MRI showed Mild nonischemic LV dysfunction and moderate-severe MR. Surprisingly, no significant murmur on exam, suspect  very eccentric jet.  Echocardiogram performed 03/02/2023 demonstrates continued mild reduction of LVEF approximately 45 to 50% and mild to moderate MR, again no significant murmur on exam today, likely eccentric jet. Heart Failure Therapy - continue at current doses ACE-I/ARB/ARNI: losartan 25 mg daily BB: metoprolol succinate 12.5 mg daily MRA: spironolactone 12.5 mg daily SGLT2I: jardience 10 mg daily Diuretic plan: no loop diuretic required. NYHA class I symptoms.  Nonrheumatic mitral valve regurgitation, mod severe by quantitation-  -Moderate to severe mitral valve regurgitation by MRI.  Mixed etiology.  Will follow with echo. With reduced LV function, may be reasonable to pursue surgical consultation for opinion after next follow up. Unsure if valve is repairable given abnormal appearance. -She has class I NYHA symptoms and no significant palpitations.  Did note some palpitations since our last visit, if these increase, consider cardiac monitor to screen for atrial fibrillation.  If atrial fibrillation has developed in the interim, would consider surgical referral for management of mitral valve disease as this likely indicates progression.  Hyperlipidemia LDL goal <70 -Most recent LDL is less than 70, continue atorvastatin 20 mg daily. Minimal CAD.  Cerebrovascular accident (CVA) due to embolism of left carotid artery (HCC) -Followed closely by neurology.   Continue aspirin 81 mg daily.  Essential (primary) hypertension-blood pressure is low normal and meds have been well titrated.   Follow-up:  6 months  Total time of encounter: 30 minutes total time of encounter,  including 20 minutes spent in face-to-face patient care on the date of this encounter. This time includes coordination of care and counseling regarding above mentioned problem list. Remainder of non-face-to-face time involved reviewing chart documents/testing relevant to the patient encounter and documentation in the medical record. I have independently reviewed documentation from referring provider.   Weston Brass, MD, [facility] [place]  [facility]   Medication Adjustments/Labs and Tests Ordered: Current medicines are reviewed at length with the patient today.  Concerns regarding medicines are outlined above.   Orders Placed This Encounter  Procedures   EKG 12-Lead   No orders of the defined types were placed in this encounter.  Patient Instructions  Medication Instructions:  No Changes In Medications at this time.  *If you need a refill on your cardiac medications before your next appointment, please call your pharmacy*  Follow-Up: At [facility], you and your health needs are our priority.  As part of our continuing mission to provide you with exceptional heart care, we have created designated Provider Care Teams.  These Care Teams include your primary Cardiologist (physician) and Advanced Practice Providers (APPs -  Physician Assistants and Nurse Practitioners) who all work together to provide you with the care you need, when you need it.  Your next appointment:   6 month(s)  Provider:   Parke Poisson, MD      [facility],acting as a scribe for Parke Poisson, MD.,have documented all relevant documentation on the behalf of Parke Poisson, MD,as directed by  Parke Poisson, MD while in the presence of Parke Poisson, MD.  I, Parke Poisson, MD, have reviewed all documentation for the visit on 03/13/2023. The documentation on today's date of service for the exam, diagnosis, procedures, and orders are all accurate and complete.

## 2023-03-13 NOTE — Patient Instructions (Signed)
Medication Instructions:  No Changes In Medications at this time.  *If you need a refill on your cardiac medications before your next appointment, please call your pharmacy*  Follow-Up: At [facility], you and your health needs are our priority.  As part of our continuing mission to provide you with exceptional heart care, we have created designated Provider Care Teams.  These Care Teams include your primary Cardiologist (physician) and Advanced Practice Providers (APPs -  Physician Assistants and Nurse Practitioners) who all work together to provide you with the care you need, when you need it.  Your next appointment:   6 month(s)  Provider:   Gayatri A Acharya, MD    

## 2023-03-15 ENCOUNTER — Other Ambulatory Visit: Payer: Self-pay | Admitting: Family Medicine

## 2023-03-15 DIAGNOSIS — I5020 Unspecified systolic (congestive) heart failure: Secondary | ICD-10-CM

## 2023-03-18 ENCOUNTER — Other Ambulatory Visit: Payer: Self-pay | Admitting: Family Medicine

## 2023-06-12 ENCOUNTER — Other Ambulatory Visit: Payer: Self-pay | Admitting: Family Medicine

## 2023-06-12 DIAGNOSIS — I5020 Unspecified systolic (congestive) heart failure: Secondary | ICD-10-CM

## 2023-06-22 ENCOUNTER — Other Ambulatory Visit: Payer: Self-pay | Admitting: Family Medicine

## 2023-06-22 DIAGNOSIS — Z1231 Encounter for screening mammogram for malignant neoplasm of breast: Secondary | ICD-10-CM

## 2023-07-09 ENCOUNTER — Other Ambulatory Visit: Payer: Self-pay | Admitting: Family Medicine

## 2023-07-11 ENCOUNTER — Other Ambulatory Visit: Payer: Self-pay | Admitting: Family Medicine

## 2023-07-11 DIAGNOSIS — I5020 Unspecified systolic (congestive) heart failure: Secondary | ICD-10-CM

## 2023-07-19 ENCOUNTER — Other Ambulatory Visit: Payer: Self-pay | Admitting: Family Medicine

## 2023-07-23 ENCOUNTER — Ambulatory Visit
Admission: RE | Admit: 2023-07-23 | Discharge: 2023-07-23 | Disposition: A | Discharge status: Home / Self Care | Payer: 59 | Source: Ambulatory Visit | Attending: Family Medicine | Admitting: Family Medicine

## 2023-07-23 DIAGNOSIS — Z1231 Encounter for screening mammogram for malignant neoplasm of breast: Secondary | ICD-10-CM | POA: Diagnosis not present

## 2023-07-29 ENCOUNTER — Ambulatory Visit (INDEPENDENT_AMBULATORY_CARE_PROVIDER_SITE_OTHER): Payer: 59

## 2023-07-29 VITALS — Ht 62.0 in | Wt 129.9 lb

## 2023-07-29 DIAGNOSIS — Z0000 Encounter for general adult medical examination without abnormal findings: Secondary | ICD-10-CM | POA: Diagnosis not present

## 2023-07-29 NOTE — Progress Notes (Signed)
Subjective:   Leah Pierce is a 72 y.o. female who presents for Medicare Annual (Subsequent) preventive examination.  Visit Complete: Virtual  I connected with  Leah Pierce on 07/29/23 by a audio enabled telemedicine application and verified that I am speaking with the correct person using two identifiers.  Patient Location: Home  Provider Location: Home Office  I discussed the limitations of evaluation and management by telemedicine. The patient expressed understanding and agreed to proceed.   .  Review of Systems    Vital Signs: Unable to obtain new vitals due to this being a telehealth visit.  Cardiac Risk Factors include: advanced age (>76men, >74 women);hypertension     Objective:    Today's Vitals   07/29/23 1343  Weight: 129 lb 14.4 oz (58.9 kg)  Height: 5\' 2"  (1.575 m)   Body mass index is 23.76 kg/m.     07/29/2023    1:49 PM 07/24/2022    2:29 PM 07/19/2021   11:29 AM 03/27/2021   11:05 AM 03/18/2021   11:21 AM 03/15/2021    5:40 PM 03/15/2021    2:51 PM  Advanced Directives  Does Patient Have a Medical Advance Directive? No No No No No No No  Would patient like information on creating a medical advance directive? No - Patient declined No - Patient declined No - Patient declined Yes (MAU/Ambulatory/Procedural Areas - Information given) No - Patient declined Yes (Inpatient - patient requests chaplain consult to create a medical advance directive)     Current Medications (verified) Outpatient Encounter Medications as of 07/29/2023  Medication Sig   albuterol (VENTOLIN HFA) 108 (90 Base) MCG/ACT inhaler Inhale 2 puffs into the lungs every 6 (six) hours as needed for wheezing or shortness of breath.   aspirin EC 81 MG tablet Take 1 tablet (81 mg total) by mouth daily. Swallow whole.   atorvastatin (LIPITOR) 20 MG tablet Take 1 tablet (20 mg total) by mouth daily.   cholecalciferol (VITAMIN D3) 25 MCG (1000 UNIT) tablet Take 1,000 Units by mouth daily.    empagliflozin (JARDIANCE) 10 MG TABS tablet Take 1 tablet (10 mg total) by mouth daily. Due for follow up.   fluticasone (FLOVENT HFA) 110 MCG/ACT inhaler Inhale 1 puff into the lungs in the morning and at bedtime.   losartan (COZAAR) 25 MG tablet Take 1 tablet (25 mg total) by mouth daily. Due for follow up   metoprolol succinate (TOPROL-XL) 25 MG 24 hr tablet TAKE 0.5 TABLETS BY MOUTH DAILY. TAKE WITH OR IMMEDIATELY FOLLOWING A MEAL.   Misc Natural Products (OSTEO BI-FLEX ADV JOINT SHIELD) TABS Take 1 tablet by mouth daily.   OVER THE COUNTER MEDICATION Take 1 tablet by mouth daily. NEURIVA   Spacer/Aero-Holding Chambers (AEROCHAMBER PLUS) inhaler Use as instructed to use with inahaler.   spironolactone (ALDACTONE) 25 MG tablet TAKE 1/2 TABLET BY MOUTH DAILY   trimethoprim-polymyxin b (POLYTRIM) ophthalmic solution Place 2 drops into both eyes every 4 hours while awake   vitamin C (ASCORBIC ACID) 500 MG tablet Take 500 mg by mouth daily.   vitamin E 200 UNIT capsule Take 200 Units by mouth daily.   No facility-administered encounter medications on file as of 07/29/2023.    Allergies (verified) Tetracyclines & related   History: Past Medical History:  Diagnosis Date   Allergy    Arthritis    Asthma    hx of   Chicken pox    Heart murmur    Migraines  Stroke [facility])    Past Surgical History:  Procedure Laterality Date   BUBBLE STUDY  03/18/2021   Procedure: BUBBLE STUDY;  Surgeon: Lewayne Bunting, MD;  Location: [facility] ENDOSCOPY;  Service: Cardiovascular;;   CESAREAN SECTION     COLONOSCOPY     TEE WITHOUT CARDIOVERSION N/A 03/18/2021   Procedure: TRANSESOPHAGEAL ECHOCARDIOGRAM (TEE);  Surgeon: Lewayne Bunting, MD;  Location: [facility] ENDOSCOPY;  Service: Cardiovascular;  Laterality: N/A;   TONSILLECTOMY  1959   Family History  Problem Relation Age of Onset   Arthritis Mother    Stroke Mother    Hypertension Mother    Diabetes Mother    Colon polyps Mother    Arthritis Father     Cancer Father        Prostate   Cancer Brother        prostate   Cancer Brother        Pancreas   Colon cancer Neg Hx    Esophageal cancer Neg Hx    Rectal cancer Neg Hx    Stomach cancer Neg Hx    Social History   Socioeconomic History   Marital status: Single    Spouse name: Not on file   Number of children: 1   Years of education: Not on file   Highest education level: Not on file  Occupational History   Not on file  Tobacco Use   Smoking status: Never   Smokeless tobacco: Never  Vaping Use   Vaping status: Never Used  Substance and Sexual Activity   Alcohol use: Yes    Comment: occasionally   Drug use: Yes    Frequency: 1.0 times per week    Types: Marijuana   Sexual activity: Not on file  Other Topics Concern   Not on file  Social History Narrative   Lives alone, her son dies   Right Handed   Drinks 1 cups caffeine daily   Social Determinants of Health   Financial Resource Strain: Low Risk  (07/29/2023)   Overall Financial Resource Strain (CARDIA)    Difficulty of Paying Living Expenses: Not hard at all  Food Insecurity: No Food Insecurity (07/29/2023)   Hunger Vital Sign    Worried About Running Out of Food in the Last Year: Never true    Ran Out of Food in the Last Year: Never true  Transportation Needs: No Transportation Needs (07/29/2023)   PRAPARE - Administrator, Civil Service (Medical): No    Lack of Transportation (Non-Medical): No  Physical Activity: Inactive (07/29/2023)   Exercise Vital Sign    Days of Exercise per Week: 0 days    Minutes of Exercise per Session: 0 min  Stress: No Stress Concern Present (07/29/2023)   [employer] of Occupational Health - Occupational Stress Questionnaire    Feeling of Stress : Not at all  Social Connections: Moderately Integrated (07/29/2023)   Social Connection and Isolation Panel [NHANES]    Frequency of Communication with Friends and Family: More than three times a week    Frequency of  Social Gatherings with Friends and Family: More than three times a week    Attends Religious Services: More than 4 times per year    Active Member of [employer] or Organizations: Yes    Attends Engineer, structural: More than 4 times per year    Marital Status: Divorced    Tobacco Counseling Counseling given: Not Answered   Clinical Intake:  Pre-visit preparation completed: Yes  Pain : No/denies pain     BMI - recorded: 23.76 Nutritional Status: BMI of 19-24  Normal Nutritional Risks: None Diabetes: No  How often do you need to have someone help you when you read instructions, pamphlets, or other written materials from your doctor or pharmacy?: 1 - Never  Interpreter Needed?: No  Information entered by :: Theresa Mulligan LPN   Activities of Daily Living    07/29/2023    1:48 PM  In your present state of health, do you have any difficulty performing the following activities:  Hearing? 0  Vision? 0  Difficulty concentrating or making decisions? 0  Walking or climbing stairs? 0  Dressing or bathing? 0  Doing errands, shopping? 0  Preparing Food and eating ? N  Using the Toilet? N  In the past six months, have you accidently leaked urine? N  Do you have problems with loss of bowel control? N  Managing your Medications? N  Managing your Finances? N  Housekeeping or managing your Housekeeping? N    Patient Care Team: [place], Betty G, MD as PCP - General (Family Medicine) Parke Poisson, MD as PCP - Cardiology (Cardiology)  Indicate any recent Medical Services you may have received from other than [facility] providers in the past year (date may be approximate).     Assessment:   This is a routine wellness examination for Leah Pierce.  Hearing/Vision screen Hearing Screening - Comments:: Denies hearing difficulties   Vision Screening - Comments:: Wears rx glasses - up to date with routine eye exams with  [facility]   Goals Addressed               This  Visit's Progress     Stay Healthy (pt-stated)         Depression Screen    07/29/2023    1:47 PM 07/24/2022    2:25 PM 05/19/2022   11:02 AM 07/19/2021   11:31 AM 07/19/2021   11:27 AM 04/08/2021   11:09 AM 08/08/2020   12:31 PM  PHQ 2/9 Scores  PHQ - 2 Score 0 0 2 0 0 0 0  PHQ- 9 Score   5        Fall Risk    07/29/2023    1:48 PM 07/24/2022    2:29 PM 05/19/2022   11:02 AM 07/19/2021   11:31 AM 04/08/2021   11:09 AM  Fall Risk   Falls in the past year? 1 0 0 0 0  Number falls in past yr: 0 0 0 0 0  Injury with Fall? 0 0 0 0   Risk for fall due to : No Fall Risks No Fall Risks  No Fall Risks No Fall Risks  Follow up Falls prevention discussed Falls prevention discussed Falls evaluation completed Falls evaluation completed Falls evaluation completed    MEDICARE RISK AT HOME: Medicare Risk at Home Any stairs in or around the home?: Yes If so, are there any without handrails?: No Home free of loose throw rugs in walkways, pet beds, electrical cords, etc?: Yes Adequate lighting in your home to reduce risk of falls?: Yes Life alert?: No Use of a cane, walker or w/c?: No Grab bars in the bathroom?: Yes Shower chair or bench in shower?: Yes Elevated toilet seat or a handicapped toilet?: Yes  TIMED UP AND GO:  Was the test performed?  No    Cognitive Function:        07/29/2023    1:49 PM 07/24/2022  2:30 PM  6CIT Screen  What Year? 0 points 0 points  What month? 0 points 0 points  What time? 0 points 0 points  Count back from 20 0 points 0 points  Months in reverse 0 points 0 points  Repeat phrase 0 points 0 points  Total Score 0 points 0 points    Immunizations Immunization History  Administered Date(s) Administered   PFIZER(Purple Top)SARS-COV-2 Vaccination 12/08/2019, 12/29/2019, 09/01/2020   Pneumococcal Conjugate-13 04/21/2018   Pneumococcal Polysaccharide-23 02/03/2020    TDAP status: Due, Education has been provided regarding the importance of this vaccine.  Advised may receive this vaccine at local pharmacy or Health Dept. Aware to provide a copy of the vaccination record if obtained from local pharmacy or Health Dept. Verbalized acceptance and understanding.    Pneumococcal vaccine status: Up to date  Covid-19 vaccine status: Declined, Education has been provided regarding the importance of this vaccine but patient still declined. Advised may receive this vaccine at local pharmacy or Health Dept.or vaccine clinic. Aware to provide a copy of the vaccination record if obtained from local pharmacy or Health Dept. Verbalized acceptance and understanding.    Screening Tests Health Maintenance  Topic Date Due   DTaP/Tdap/Td (1 - Tdap) Never done   COVID-19 Vaccine (4 - 2023-24 season) 07/19/2023   MAMMOGRAM  07/22/2024   Medicare Annual Wellness (AWV)  07/28/2024   Colonoscopy  06/02/2027   Pneumonia Vaccine 70+ Years old  Completed   DEXA SCAN  Completed   Hepatitis C Screening  Completed   HPV VACCINES  Aged Out   INFLUENZA VACCINE  Discontinued   Zoster Vaccines- Shingrix  Discontinued    Health Maintenance  Health Maintenance Due  Topic Date Due   DTaP/Tdap/Td (1 - Tdap) Never done   COVID-19 Vaccine (4 - 2023-24 season) 07/19/2023    Colorectal cancer screening: Type of screening: Colonoscopy. Completed 05/02/17. Repeat every 10 years  Mammogram status: Completed 07/23/23. Repeat every year  Bone Density status: Completed 06/07/18. Results reflect: Bone density results: OSTEOPENIA. Repeat every   years.  Lung Cancer Screening: (Low Dose CT Chest recommended if Age 56-80 years, 20 pack-year currently smoking OR have quit w/in 15years.) does not qualify.     Additional Screening:  Hepatitis C Screening: does qualify; Completed 04/21/18  Vision Screening: Recommended annual ophthalmology exams for early detection of glaucoma and other disorders of the eye. Is the patient up to date with their annual eye exam?  Yes  Who is the  provider or what is the name of the office in which the patient attends annual eye exams? [facility] If pt is not established with a provider, would they like to be referred to a provider to establish care? No .   Dental Screening: Recommended annual dental exams for proper oral hygiene   Community Resource Referral / Chronic Care Management:  CRR required this visit?  No   CCM required this visit?  No     Plan:     I have personally reviewed and noted the following in the patient's chart:   Medical and social history Use of alcohol, tobacco or illicit drugs  Current medications and supplements including opioid prescriptions. Patient is not currently taking opioid prescriptions. Functional ability and status Nutritional status Physical activity Advanced directives List of other physicians Hospitalizations, surgeries, and ER visits in previous 12 months Vitals Screenings to include cognitive, depression, and falls Referrals and appointments  In addition, I have reviewed and discussed with  patient certain preventive protocols, quality metrics, and best practice recommendations. A written personalized care plan for preventive services as well as general preventive health recommendations were provided to patient.     [name], LPN   9/81/1914   After Visit Summary: (MyChart) Due to this being a telephonic visit, the after visit summary with patients personalized plan was offered to patient via MyChart   Nurse Notes: None

## 2023-07-29 NOTE — Patient Instructions (Addendum)
Ms. Guel , Thank you for taking time to come for your Medicare Wellness Visit. I appreciate your ongoing commitment to your health goals. Please review the following plan we discussed and let me know if I can assist you in the future.   Referrals/Orders/Follow-Ups/Clinician Recommendations:    This is a list of the screening recommended for you and due dates:  Health Maintenance  Topic Date Due   DTaP/Tdap/Td vaccine (1 - Tdap) Never done   COVID-19 Vaccine (4 - 2023-24 season) 07/19/2023   Mammogram  07/22/2024   Medicare Annual Wellness Visit  07/28/2024   Colon Cancer Screening  06/02/2027   Pneumonia Vaccine  Completed   DEXA scan (bone density measurement)  Completed   Hepatitis C Screening  Completed   HPV Vaccine  Aged Out   Flu Shot  Discontinued   Zoster (Shingles) Vaccine  Discontinued    Advanced directives: (Declined) Advance directive discussed with you today. Even though you declined this today, please call our office should you change your mind, and we can give you the proper paperwork for you to fill out.  Next Medicare Annual Wellness Visit scheduled for next year: Yes

## 2023-08-04 ENCOUNTER — Other Ambulatory Visit: Payer: Self-pay | Admitting: Internal Medicine

## 2023-08-04 ENCOUNTER — Ambulatory Visit (INDEPENDENT_AMBULATORY_CARE_PROVIDER_SITE_OTHER): Payer: 59 | Admitting: Family Medicine

## 2023-08-04 ENCOUNTER — Encounter: Payer: Self-pay | Admitting: Family Medicine

## 2023-08-04 VITALS — BP 130/80 | HR 82 | Temp 97.8°F | Resp 12 | Ht 62.0 in | Wt 135.2 lb

## 2023-08-04 DIAGNOSIS — I10 Essential (primary) hypertension: Secondary | ICD-10-CM | POA: Diagnosis not present

## 2023-08-04 DIAGNOSIS — M816 Localized osteoporosis [Lequesne]: Secondary | ICD-10-CM

## 2023-08-04 DIAGNOSIS — R7303 Prediabetes: Secondary | ICD-10-CM

## 2023-08-04 DIAGNOSIS — I5020 Unspecified systolic (congestive) heart failure: Secondary | ICD-10-CM

## 2023-08-04 DIAGNOSIS — E785 Hyperlipidemia, unspecified: Secondary | ICD-10-CM

## 2023-08-04 DIAGNOSIS — F1220 Cannabis dependence, uncomplicated: Secondary | ICD-10-CM | POA: Insufficient documentation

## 2023-08-04 DIAGNOSIS — Z0000 Encounter for general adult medical examination without abnormal findings: Secondary | ICD-10-CM | POA: Diagnosis not present

## 2023-08-04 LAB — LIPID PANEL
Cholesterol: 161 mg/dL (ref 0–200)
HDL: 82.6 mg/dL (ref >39.00)
LDL Cholesterol: 64 mg/dL (ref 0–99)
NonHDL: 78.24
Total CHOL/HDL Ratio: 2
Triglycerides: 69 mg/dL (ref 0.0–149.0)
VLDL: 13.8 mg/dL (ref 0.0–40.0)

## 2023-08-04 LAB — COMPREHENSIVE METABOLIC PANEL
ALT: 16 U/L (ref 0–35)
AST: 22 U/L (ref 0–37)
Albumin: 4 g/dL (ref 3.5–5.2)
Alkaline Phosphatase: 48 U/L (ref 39–117)
BUN: 14 mg/dL (ref 6–23)
CO2: 27 meq/L (ref 19–32)
Calcium: 9.7 mg/dL (ref 8.4–10.5)
Chloride: 104 meq/L (ref 96–112)
Creatinine, Ser: 0.84 mg/dL (ref 0.40–1.20)
GFR: 70.05 mL/min (ref >60.00)
Glucose, Bld: 95 mg/dL (ref 70–99)
Potassium: 4.3 meq/L (ref 3.5–5.1)
Sodium: 139 meq/L (ref 135–145)
Total Bilirubin: 0.7 mg/dL (ref 0.2–1.2)
Total Protein: 7 g/dL (ref 6.0–8.3)

## 2023-08-04 MED ORDER — EMPAGLIFLOZIN 10 MG PO TABS: 10.0000 mg | ORAL_TABLET | Freq: Every day | ORAL | 3 refills | Status: DC

## 2023-08-04 MED ORDER — METOPROLOL SUCCINATE ER 25 MG PO TB24: 12.5000 mg | ORAL_TABLET | Freq: Every day | ORAL | 2 refills | Status: DC

## 2023-08-04 MED ORDER — LOSARTAN POTASSIUM 25 MG PO TABS: 25.0000 mg | ORAL_TABLET | Freq: Every day | ORAL | 3 refills | Status: DC

## 2023-08-04 MED ORDER — SPIRONOLACTONE 25 MG PO TABS: 12.5000 mg | ORAL_TABLET | Freq: Every day | ORAL | 2 refills | Status: DC

## 2023-08-04 NOTE — Assessment & Plan Note (Signed)
HgA1C 5.9 in 02/2021. Encouraged consistency with a healthy life style for diabetes prevention. Further recommendations according to HgA1C result.

## 2023-08-04 NOTE — Assessment & Plan Note (Addendum)
A few SBP's low 100's. She is not symptomatic. Continue same dose Metoprolol succinate,spironolactone, and Losartan. Low salt diet to continue. Continue monitoring BP at home. Eye exam is current.

## 2023-08-04 NOTE — Progress Notes (Signed)
HPI: Leah Pierce is a 72 y.o. female  with PMHx significant for HLD, Pre DM, HTN, CVA, osteoporosis who is here today for her routine physical.  Last CPE: 02/03/2020  She reports she is still working part time, between 3-5x/week  Exercise: She reports no exercise outside of walking at work Diet: She eats vegetables, chicken, fish, and red meat. She is cooking at home.   Sleep: She reports 6-8 hours of sleep per night and sometimes more if she can. Smoking: Never Cannabis: Not daily, a few times per month. She reports drinking wine and beer with some consistency, but not daily.   Dental: UTD on routine dental care. She has an appointment tomorrow.  Vision: UTD on routine vision care.   Immunization History  Administered Date(s) Administered   Covid-19, Mrna,Vaccine(Spikevax)32yrs and older 09/04/2022   PFIZER(Purple Top)SARS-COV-2 Vaccination 12/08/2019, 12/29/2019, 09/01/2020   Pneumococcal Conjugate-13 04/21/2018   Pneumococcal Polysaccharide-23 02/03/2020   Health Maintenance  Topic Date Due   COVID-19 Vaccine (5 - 2023-24 season) 08/20/2023 (Originally 07/19/2023)   MAMMOGRAM  07/22/2024   Medicare Annual Wellness (AWV)  07/28/2024   Colonoscopy  06/02/2027   Pneumonia Vaccine 98+ Years old  Completed   DEXA SCAN  Completed   Hepatitis C Screening  Completed   HPV VACCINES  Aged Out   DTaP/Tdap/Td  Discontinued   INFLUENZA VACCINE  Discontinued   Zoster Vaccines- Shingrix  Discontinued   Hypertension and HFrEF:  She is on Losartan 25 mg, Jardiance 10 mg, Spironolactone 12.5 mg, Metoprolol 12.5 mg BP readings at home: She has taken consistent BP readings at home. Systolic readings have averaged 120s and ranged from 100s-140s.  Side effects: none She is seeing her cardiologist every six months and next visit is in 10/24. Negative for unusual or severe headache, dizziness, visual changes, exertional chest pain, dyspnea, PND,orthopnea, focal weakness, or  edema.  Lab Results  Component Value Date   CREATININE 0.82 12/27/2021   BUN 18 12/27/2021   NA 141 12/27/2021   K 4.5 12/27/2021   CL 104 12/27/2021   CO2 21 12/27/2021   Hyperlipidemia: Currently on: Atorvastatin 20 mg Side effects from medication: none Lab Results  Component Value Date   CHOL 154 06/06/2021   HDL 83 06/06/2021   LDLCALC 60 06/06/2021   TRIG 53 06/06/2021   CHOLHDL 1.9 06/06/2021   Asthma:She reports she hasn't needed her albuterol much in the last several months.   Fall:She reports she fell at work on 07/23/23, no residual pain or changes in ROM. Review of Systems  Constitutional:  Negative for activity change, appetite change and fever.  HENT:  Negative for hearing loss, mouth sores, sore throat and trouble swallowing.   Eyes:  Negative for redness and visual disturbance.  Respiratory:  Negative for cough, shortness of breath and wheezing.   Cardiovascular:  Negative for chest pain and leg swelling.  Gastrointestinal:  Negative for abdominal pain, nausea and vomiting.       No changes in bowel habits.  Endocrine: Negative for cold intolerance, heat intolerance, polydipsia, polyphagia and polyuria.  Genitourinary:  Negative for decreased urine volume, dysuria, hematuria, vaginal bleeding and vaginal discharge.  Musculoskeletal:  Negative for gait problem and myalgias.  Skin:  Negative for color change and rash.  Allergic/Immunologic: Positive for environmental allergies.  Neurological:  Negative for syncope, weakness and headaches.  Hematological:  Negative for adenopathy. Does not bruise/bleed easily.  Psychiatric/Behavioral:  Negative for confusion. The patient is not nervous/anxious.  All other systems reviewed and are negative.  Current Outpatient Medications on File Prior to Visit  Medication Sig Dispense Refill   albuterol (VENTOLIN HFA) 108 (90 Base) MCG/ACT inhaler Inhale 2 puffs into the lungs every 6 (six) hours as needed for wheezing or  shortness of breath. 18 g 3   aspirin EC 81 MG tablet Take 1 tablet (81 mg total) by mouth daily. Swallow whole. 30 tablet 11   atorvastatin (LIPITOR) 20 MG tablet Take 1 tablet (20 mg total) by mouth daily. 90 tablet 1   cholecalciferol (VITAMIN D3) 25 MCG (1000 UNIT) tablet Take 1,000 Units by mouth daily.     fluticasone (FLOVENT HFA) 110 MCG/ACT inhaler Inhale 1 puff into the lungs in the morning and at bedtime. 1 Inhaler 3   Misc Natural Products (OSTEO BI-FLEX ADV JOINT SHIELD) TABS Take 1 tablet by mouth daily.     OVER THE COUNTER MEDICATION Take 1 tablet by mouth daily. NEURIVA     Spacer/Aero-Holding Chambers (AEROCHAMBER PLUS) inhaler Use as instructed to use with inahaler. 1 each 1   trimethoprim-polymyxin b (POLYTRIM) ophthalmic solution Place 2 drops into both eyes every 4 hours while awake 10 mL 0   vitamin C (ASCORBIC ACID) 500 MG tablet Take 500 mg by mouth daily.     vitamin E 200 UNIT capsule Take 200 Units by mouth daily.     No current facility-administered medications on file prior to visit.   Past Medical History:  Diagnosis Date   Allergy    Arthritis    Asthma    hx of   Chicken pox    Heart murmur    Migraines    Stroke [facility])    Past Surgical History:  Procedure Laterality Date   BUBBLE STUDY  03/18/2021   Procedure: BUBBLE STUDY;  Surgeon: Lewayne Bunting, MD;  Location: [facility] ENDOSCOPY;  Service: Cardiovascular;;   CESAREAN SECTION     COLONOSCOPY     TEE WITHOUT CARDIOVERSION N/A 03/18/2021   Procedure: TRANSESOPHAGEAL ECHOCARDIOGRAM (TEE);  Surgeon: Lewayne Bunting, MD;  Location: [facility] ENDOSCOPY;  Service: Cardiovascular;  Laterality: N/A;   TONSILLECTOMY  1959    Allergies  Allergen Reactions   Tetracyclines & Related     Rash on hands    Family History  Problem Relation Age of Onset   Arthritis Mother    Stroke Mother    Hypertension Mother    Diabetes Mother    Colon polyps Mother    Arthritis Father    Cancer Father        Prostate    Cancer Brother        prostate   Cancer Brother        Pancreas   Colon cancer Neg Hx    Esophageal cancer Neg Hx    Rectal cancer Neg Hx    Stomach cancer Neg Hx     Social History   Socioeconomic History   Marital status: Single    Spouse name: Not on file   Number of children: 1   Years of education: Not on file   Highest education level: Not on file  Occupational History   Not on file  Tobacco Use   Smoking status: Never   Smokeless tobacco: Never  Vaping Use   Vaping status: Never Used  Substance and Sexual Activity   Alcohol use: Yes    Comment: occasionally   Drug use: Yes    Frequency: 1.0 times per week  Types: Marijuana   Sexual activity: Not on file  Other Topics Concern   Not on file  Social History Narrative   Lives alone, her son dies   Right Handed   Drinks 1 cups caffeine daily   Social Determinants of Health   Financial Resource Strain: Low Risk  (07/29/2023)   Overall Financial Resource Strain (CARDIA)    Difficulty of Paying Living Expenses: Not hard at all  Food Insecurity: No Food Insecurity (07/29/2023)   Hunger Vital Sign    Worried About Running Out of Food in the Last Year: Never true    Ran Out of Food in the Last Year: Never true  Transportation Needs: No Transportation Needs (07/29/2023)   PRAPARE - Administrator, Civil Service (Medical): No    Lack of Transportation (Non-Medical): No  Physical Activity: Inactive (07/29/2023)   Exercise Vital Sign    Days of Exercise per Week: 0 days    Minutes of Exercise per Session: 0 min  Stress: No Stress Concern Present (07/29/2023)   [employer] of Occupational Health - Occupational Stress Questionnaire    Feeling of Stress : Not at all  Social Connections: Moderately Integrated (07/29/2023)   Social Connection and Isolation Panel [NHANES]    Frequency of Communication with Friends and Family: More than three times a week    Frequency of Social Gatherings with Friends and  Family: More than three times a week    Attends Religious Services: More than 4 times per year    Active Member of Clubs or Organizations: Yes    Attends Banker Meetings: More than 4 times per year    Marital Status: Divorced   Vitals:   08/04/23 1056  BP: 130/80  Pulse: 82  Resp: 12  Temp: 97.8 F (36.6 C)  SpO2: 98%   Body mass index is 24.74 kg/m.  Wt Readings from Last 3 Encounters:  08/04/23 135 lb 4 oz (61.3 kg)  07/29/23 129 lb 14.4 oz (58.9 kg)  03/13/23 135 lb 3.2 oz (61.3 kg)   Physical Exam Vitals and nursing note reviewed.  Constitutional:      General: She is not in acute distress.    Appearance: She is well-developed.  HENT:     Head: Normocephalic and atraumatic.     Right Ear: Tympanic membrane, ear canal and external ear normal.     Left Ear: Tympanic membrane, ear canal and external ear normal.     Mouth/Throat:     Mouth: Mucous membranes are moist.     Pharynx: Oropharynx is clear. Uvula midline.  Eyes:     Conjunctiva/sclera: Conjunctivae normal.     Pupils: Pupils are equal, round, and reactive to light.  Neck:     Thyroid: No thyroid mass.     Vascular: No JVD.     Trachea: No tracheal deviation.  Cardiovascular:     Rate and Rhythm: Normal rate and regular rhythm.     Pulses:          Dorsalis pedis pulses are 2+ on the right side and 2+ on the left side.     Heart sounds: No murmur heard. Pulmonary:     Effort: Pulmonary effort is normal. No respiratory distress.     Breath sounds: Normal breath sounds.  Abdominal:     Palpations: Abdomen is soft. There is no hepatomegaly or mass.     Tenderness: There is no abdominal tenderness.  Musculoskeletal:     Comments:  No major deformity or signs of synovitis appreciated.  Lymphadenopathy:     Cervical: No cervical adenopathy.  Skin:    General: Skin is warm.     Findings: No erythema or rash.  Neurological:     General: No focal deficit present.     Mental Status: She is  alert and oriented to person, place, and time.     Cranial Nerves: No cranial nerve deficit.     Coordination: Coordination normal.     Gait: Gait normal.     Deep Tendon Reflexes:     Reflex Scores:      Bicep reflexes are 2+ on the right side and 2+ on the left side.      Patellar reflexes are 2+ on the right side and 2+ on the left side. Psychiatric:        Mood and Affect: Mood and affect normal.   ASSESSMENT AND PLAN: Leah Pierce was here today annual physical examination.  Orders Placed This Encounter  Procedures   DG Bone Density   Comprehensive metabolic panel   Lipid panel   Lab Results  Component Value Date   CHOL 161 08/04/2023   HDL 82.60 08/04/2023   LDLCALC 64 08/04/2023   TRIG 69.0 08/04/2023   CHOLHDL 2 08/04/2023   Lab Results  Component Value Date   NA 139 08/04/2023   CL 104 08/04/2023   K 4.3 08/04/2023   CO2 27 08/04/2023   BUN 14 08/04/2023   CREATININE 0.84 08/04/2023   GFR 70.05 08/04/2023   CALCIUM 9.7 08/04/2023   ALBUMIN 4.0 08/04/2023   GLUCOSE 95 08/04/2023   Lab Results  Component Value Date   ALT 16 08/04/2023   AST 22 08/04/2023   ALKPHOS 48 08/04/2023   BILITOT 0.7 08/04/2023   Routine general medical examination at a health care facility Assessment & Plan: We discussed the importance of regular physical activity and healthy diet for prevention of chronic illness and/or complications. Preventive guidelines reviewed. Vaccination: Recommend getting shingrix at her pharmacy, declined flu vaccine. Ca++ and vit D supplementation to continue. Next CPE in a year.   Essential (primary) hypertension Assessment & Plan: A few SBP's low 100's. She is not symptomatic. Continue same dose Metoprolol succinate,spironolactone, and Losartan. Low salt diet to continue. Continue monitoring BP at home. Eye exam is current.  Orders: -     Losartan Potassium; Take 1 tablet (25 mg total) by mouth daily. Due for follow up   Dispense: 90 tablet; Refill: 3 -     Comprehensive metabolic panel; Future  HFrEF (heart failure with reduced ejection fraction) (HCC) Assessment & Plan: She has been asymptomatic, euvolemic. Continue Metoprolol succinate, Losartan,Spironolactone,and Jardiance. Continue low-salt diet. Follows with cardiologist.  Orders: -     Empagliflozin; Take 1 tablet (10 mg total) by mouth daily. Due for follow up.  Dispense: 90 tablet; Refill: 3 -     Losartan Potassium; Take 1 tablet (25 mg total) by mouth daily. Due for follow up  Dispense: 90 tablet; Refill: 3 -     Metoprolol Succinate ER; Take 0.5 tablets (12.5 mg total) by mouth daily.  Dispense: 45 tablet; Refill: 2 -     Spironolactone; Take 0.5 tablets (12.5 mg total) by mouth daily.  Dispense: 45 tablet; Refill: 2  Hyperlipidemia LDL goal <70 Assessment & Plan: LDL 60 in 05/2021. Continue atorvastatin 20 mg daily and low fat diet. Further recommendations according to FLP results.  Orders: -  Comprehensive metabolic panel; Future -     Lipid panel; Future  Localized osteoporosis without current pathological fracture Assessment & Plan: Took Fosamax for 2 years, discontinued because difficulty keeping up with medication due to work schedule. Continue calcium, vit D supplementation, fall precautions. Regular weightbearing exercises 3-4 times per week. DEXA will be arranged.  Orders: -     DG Bone Density; Future  Cannabis dependence, uncomplicated (HCC) Assessment & Plan: Stable. Occasional use, a few times per month.   Prediabetes Assessment & Plan: HgA1C 5.9 in 02/2021. Encouraged consistency with a healthy life style for diabetes prevention. Further recommendations according to HgA1C result.   Return in about 1 year (around 08/03/2024) for CPE.  I,Rachel Rivera,acting as a scribe for Betty Swaziland, MD.,have documented all relevant documentation on the behalf of Betty [place], MD,as directed by  Betty [place], MD while in  the presence of Betty [place], MD.  I, Isabelle Course, have reviewed all documentation for this visit. The documentation on 08/04/23 for the exam, diagnosis, procedures, and orders are all accurate and complete.  Betty G. Swaz[place]and, MD  [facility]. [place] office.

## 2023-08-04 NOTE — Assessment & Plan Note (Signed)
LDL 60 in 05/2021. Continue atorvastatin 20 mg daily and low fat diet. Further recommendations according to FLP results.

## 2023-08-04 NOTE — Assessment & Plan Note (Signed)
Took Fosamax for 2 years, discontinued because difficulty keeping up with medication due to work schedule. Continue calcium, vit D supplementation, fall precautions. Regular weightbearing exercises 3-4 times per week. DEXA will be arranged.

## 2023-08-04 NOTE — Assessment & Plan Note (Signed)
We discussed the importance of regular physical activity and healthy diet for prevention of chronic illness and/or complications. Preventive guidelines reviewed. Vaccination: Recommend getting shingrix at her pharmacy, declined flu vaccine. Ca++ and vit D supplementation to continue. Next CPE in a year.

## 2023-08-04 NOTE — Assessment & Plan Note (Signed)
Stable. Occasional use, a few times per month.

## 2023-08-04 NOTE — Patient Instructions (Addendum)
A few things to remember from today's visit:  Routine general medical examination at a health care facility  Essential (primary) hypertension  If you need refills for medications you take chronically, please call your pharmacy. Do not use My Chart to request refills or for acute issues that need immediate attention. If you send a my chart message, it may take a few days to be addressed, specially if I am not in the office.  Please be sure medication list is accurate. If a new problem present, please set up appointment sooner than planned today.

## 2023-08-04 NOTE — Assessment & Plan Note (Addendum)
She has been asymptomatic, euvolemic. Continue Metoprolol succinate, Losartan,Spironolactone,and Jardiance. Continue low-salt diet. Follows with cardiologist.

## 2023-08-28 ENCOUNTER — Ambulatory Visit (INDEPENDENT_AMBULATORY_CARE_PROVIDER_SITE_OTHER)
Admission: RE | Admit: 2023-08-28 | Discharge: 2023-08-28 | Disposition: A | Discharge status: Home / Self Care | Payer: 59 | Source: Ambulatory Visit | Attending: Family Medicine | Admitting: Family Medicine

## 2023-08-28 DIAGNOSIS — M816 Localized osteoporosis [Lequesne]: Secondary | ICD-10-CM

## 2023-09-09 ENCOUNTER — Telehealth: Payer: Self-pay | Admitting: Internal Medicine

## 2023-09-09 DIAGNOSIS — E785 Hyperlipidemia, unspecified: Secondary | ICD-10-CM

## 2023-09-09 MED ORDER — ATORVASTATIN CALCIUM 20 MG PO TABS: 20.0000 mg | ORAL_TABLET | Freq: Every day | ORAL | 3 refills | Status: DC

## 2023-09-09 NOTE — Telephone Encounter (Signed)
*  STAT* If patient is at the pharmacy, call can be transferred to refill team.   1. Which medications need to be refilled? (please list name of each medication and dose if known)   atorvastatin (LIPITOR) 20 MG tablet   2. Would you like to learn more about the convenience, safety, & potential cost savings by using the [facility]?   3. Are you open to using the [facility] (Type [facility]. ).  4. Which pharmacy/location (including street and city if local pharmacy) is medication to be sent to?  [id number] - [place], [place] - [address]   5. Do they need a 30 day or 90 day supply?   90 day  Patient stated she has a few tablets left.  Patient has appointment scheduled on 11/7.

## 2023-09-23 ENCOUNTER — Ambulatory Visit (INDEPENDENT_AMBULATORY_CARE_PROVIDER_SITE_OTHER): Payer: 59 | Admitting: Family Medicine

## 2023-09-23 ENCOUNTER — Encounter: Payer: Self-pay | Admitting: Family Medicine

## 2023-09-23 VITALS — BP 104/70 | HR 70 | Temp 97.9°F | Resp 16 | Ht 62.0 in | Wt 134.0 lb

## 2023-09-23 DIAGNOSIS — M816 Localized osteoporosis [Lequesne]: Secondary | ICD-10-CM

## 2023-09-23 MED ORDER — ALENDRONATE SODIUM 70 MG PO TABS: 70.0000 mg | ORAL_TABLET | ORAL | 3 refills | Status: DC

## 2023-09-23 NOTE — Assessment & Plan Note (Signed)
We reviewed her recent DEXA results. Discussed diagnosis, prognosis, and treatment options. She agrees with taking Fosamax 70 mg weekly. We discussed some side effects of medication. She has an appointment with her dentist in a month, instructed to hold on medication until her appointment, so she can have dental procedures, if needed, before starting medication. Continue adequate calcium and vitamin D supplementation. Fall prevention and weightbearing exercises 3 times per week also recommended. We can repeat DEXA in 2 to 3 years.

## 2023-09-23 NOTE — Patient Instructions (Signed)
A few things to remember from today's visit:  Localized osteoporosis without current pathological fracture - Plan: alendronate (FOSAMAX) 70 MG tablet  If you need refills for medications you take chronically, please call your pharmacy. Do not use My Chart to request refills or for acute issues that need immediate attention. If you send a my chart message, it may take a few days to be addressed, specially if I am not in the office.  Please be sure medication list is accurate. If a new problem present, please set up appointment sooner than planned today.

## 2023-09-23 NOTE — Progress Notes (Signed)
HPI: Leah Pierce is a 72 y.o. female with a PMHx significant for HLD, Pre DM, HTN, CVA, and osteoporosis, who is here today to discuss recent DEXA results and treatment options.  Last seen on 08/04/2023 DEXA done on 08/28/2023, which showed a T-score of -2.5 in the lumbar spine (L1-L4), and -2.5 in the left femoral neck.  Right femoral neck with a T-score of -2.3. She is concerned about needing daily medication, which she states she cannot do due to work schedule, sometimes she has to start work at 6 am, so gets up around 4 am, weigh herself daily and takes BP and takes some of her meds.  She does not exercise regularly. General she follows a healthful diet. She mentions her mother had osteoporosis of the spine.  She takes vitamin D and calcium supplementation.  She took Fosomax for ~1.5 years starting in 1995.  She does not recall side effects, stopped because she was supposed to take it daily.  HFrEF:She has an appointment with cardiology tomorrow.  She denies CP, dyspnea, orthopnea, or PND.  Review of Systems  Constitutional:  Negative for activity change, appetite change, chills and fever.  HENT:  Negative for sore throat.   Respiratory:  Negative for cough, shortness of breath and wheezing.   Cardiovascular:  Negative for chest pain, palpitations and leg swelling.  Gastrointestinal:  Negative for abdominal pain, nausea and vomiting.  Endocrine: Negative for cold intolerance and heat intolerance.  Skin:  Negative for rash.  Neurological:  Negative for syncope, weakness and headaches.  See other pertinent positives and negatives in HPI.  Current Outpatient Medications on File Prior to Visit  Medication Sig Dispense Refill   albuterol (VENTOLIN HFA) 108 (90 Base) MCG/ACT inhaler Inhale 2 puffs into the lungs every 6 (six) hours as needed for wheezing or shortness of breath. 18 g 3   aspirin EC 81 MG tablet Take 1 tablet (81 mg total) by mouth daily. Swallow whole. 30  tablet 11   atorvastatin (LIPITOR) 20 MG tablet Take 1 tablet (20 mg total) by mouth daily. 90 tablet 3   cholecalciferol (VITAMIN D3) 25 MCG (1000 UNIT) tablet Take 1,000 Units by mouth daily.     empagliflozin (JARDIANCE) 10 MG TABS tablet Take 1 tablet (10 mg total) by mouth daily. Due for follow up. 90 tablet 3   fluticasone (FLOVENT HFA) 110 MCG/ACT inhaler Inhale 1 puff into the lungs in the morning and at bedtime. 1 Inhaler 3   losartan (COZAAR) 25 MG tablet Take 1 tablet (25 mg total) by mouth daily. Due for follow up 90 tablet 3   metoprolol succinate (TOPROL-XL) 25 MG 24 hr tablet Take 0.5 tablets (12.5 mg total) by mouth daily. 45 tablet 2   Misc Natural Products (OSTEO BI-FLEX ADV JOINT SHIELD) TABS Take 1 tablet by mouth daily.     OVER THE COUNTER MEDICATION Take 1 tablet by mouth daily. NEURIVA     Spacer/Aero-Holding Chambers (AEROCHAMBER PLUS) inhaler Use as instructed to use with inahaler. 1 each 1   spironolactone (ALDACTONE) 25 MG tablet Take 0.5 tablets (12.5 mg total) by mouth daily. 45 tablet 2   trimethoprim-polymyxin b (POLYTRIM) ophthalmic solution Place 2 drops into both eyes every 4 hours while awake 10 mL 0   vitamin C (ASCORBIC ACID) 500 MG tablet Take 500 mg by mouth daily.     vitamin E 200 UNIT capsule Take 200 Units by mouth daily.     No current facility-administered medications  on file prior to visit.    Past Medical History:  Diagnosis Date   Allergy    Arthritis    Asthma    hx of   Chicken pox    Heart murmur    Migraines    Stroke (HCC)    Allergies  Allergen Reactions   Tetracyclines & Related     Rash on hands    Social History   Socioeconomic History   Marital status: Single    Spouse name: Not on file   Number of children: 1   Years of education: Not on file   Highest education level: Not on file  Occupational History   Not on file  Tobacco Use   Smoking status: Never   Smokeless tobacco: Never  Vaping Use   Vaping status:  Never Used  Substance and Sexual Activity   Alcohol use: Yes    Comment: occasionally   Drug use: Yes    Frequency: 1.0 times per week    Types: Marijuana   Sexual activity: Not on file  Other Topics Concern   Not on file  Social History Narrative   Lives alone, her son dies   Right Handed   Drinks 1 cups caffeine daily   Social Determinants of Health   Financial Resource Strain: Low Risk  (07/29/2023)   Overall Financial Resource Strain (CARDIA)    Difficulty of Paying Living Expenses: Not hard at all  Food Insecurity: No Food Insecurity (07/29/2023)   Hunger Vital Sign    Worried About Running Out of Food in the Last Year: Never true    Ran Out of Food in the Last Year: Never true  Transportation Needs: No Transportation Needs (07/29/2023)   PRAPARE - Administrator, Civil Service (Medical): No    Lack of Transportation (Non-Medical): No  Physical Activity: Inactive (07/29/2023)   Exercise Vital Sign    Days of Exercise per Week: 0 days    Minutes of Exercise per Session: 0 min  Stress: No Stress Concern Present (07/29/2023)   [employer] of Occupational Health - Occupational Stress Questionnaire    Feeling of Stress : Not at all  Social Connections: Moderately Integrated (07/29/2023)   Social Connection and Isolation Panel [NHANES]    Frequency of Communication with Friends and Family: More than three times a week    Frequency of Social Gatherings with Friends and Family: More than three times a week    Attends Religious Services: More than 4 times per year    Active Member of [employer] or Organizations: Yes    Attends Banker Meetings: More than 4 times per year    Marital Status: Divorced    Vitals:   09/23/23 1213  BP: 104/70  Pulse: 70  Resp: 16  Temp: 97.9 F (36.6 C)  SpO2: 99%   Body mass index is 24.51 kg/m.  Physical Exam Vitals and nursing note reviewed.  Constitutional:      General: She is not in acute distress.     Appearance: She is well-developed.  HENT:     Head: Normocephalic and atraumatic.  Eyes:     Conjunctiva/sclera: Conjunctivae normal.  Cardiovascular:     Rate and Rhythm: Normal rate and regular rhythm.     Heart sounds: No murmur heard. Pulmonary:     Effort: Pulmonary effort is normal. No respiratory distress.     Breath sounds: Normal breath sounds.  Abdominal:     Palpations: Abdomen is soft.  There is no mass.     Tenderness: There is no abdominal tenderness.  Musculoskeletal:     Right lower leg: No edema.     Left lower leg: No edema.  Skin:    General: Skin is warm.     Findings: No erythema or rash.  Neurological:     General: No focal deficit present.     Mental Status: She is alert and oriented to person, place, and time.     Cranial Nerves: No cranial nerve deficit.     Gait: Gait normal.  Psychiatric:        Mood and Affect: Mood and affect normal.   ASSESSMENT AND PLAN:  Ms. Bernstein was seen today for osteoporosis.   Localized osteoporosis without current pathological fracture Assessment & Plan: We reviewed her recent DEXA results. Discussed diagnosis, prognosis, and treatment options. She agrees with taking Fosamax 70 mg weekly. We discussed some side effects of medication. She has an appointment with her dentist in a month, instructed to hold on medication until her appointment, so she can have dental procedures, if needed, before starting medication. Continue adequate calcium and vitamin D supplementation. Fall prevention and weightbearing exercises 3 times per week also recommended. We can repeat DEXA in 2 to 3 years.  Orders: -     Alendronate Sodium; Take 1 tablet (70 mg total) by mouth every 7 (seven) days. Take with a full glass of water on an empty stomach.  Dispense: 13 tablet; Refill: 3   Return if symptoms worsen or fail to improve, for keep next appointment.  I, Rolla Etienne Wierda, acting as a scribe for Aubrianne Molyneux [place], MD., have documented all  relevant documentation on the behalf of Amaro Mangold [place], MD, as directed by  Rayana Geurin [place], MD while in the presence of Nosson Wender [place], MD.   I, Deandrea Vanpelt [place], MD, have reviewed all documentation for this visit. The documentation on 09/23/23 for the exam, diagnosis, procedures, and orders are all accurate and complete.  Otisha Spickler G. [place], MD  [facility]. [place] [facility].

## 2023-09-24 ENCOUNTER — Ambulatory Visit: Payer: 59 | Attending: Internal Medicine | Admitting: Internal Medicine

## 2023-09-24 VITALS — BP 124/74 | HR 79 | Wt 134.2 lb

## 2023-09-24 DIAGNOSIS — I340 Nonrheumatic mitral (valve) insufficiency: Secondary | ICD-10-CM

## 2023-09-24 DIAGNOSIS — I5020 Unspecified systolic (congestive) heart failure: Secondary | ICD-10-CM | POA: Diagnosis not present

## 2023-09-24 DIAGNOSIS — I10 Essential (primary) hypertension: Secondary | ICD-10-CM | POA: Diagnosis not present

## 2023-09-24 DIAGNOSIS — I6523 Occlusion and stenosis of bilateral carotid arteries: Secondary | ICD-10-CM

## 2023-09-24 DIAGNOSIS — I5042 Chronic combined systolic (congestive) and diastolic (congestive) heart failure: Secondary | ICD-10-CM | POA: Diagnosis not present

## 2023-09-24 DIAGNOSIS — E785 Hyperlipidemia, unspecified: Secondary | ICD-10-CM | POA: Diagnosis not present

## 2023-09-24 DIAGNOSIS — I63132 Cerebral infarction due to embolism of left carotid artery: Secondary | ICD-10-CM

## 2023-09-24 DIAGNOSIS — I341 Nonrheumatic mitral (valve) prolapse: Secondary | ICD-10-CM

## 2023-09-24 NOTE — Addendum Note (Signed)
Addended by: Tynetta Bachmann, Bryonna Sundby C on: 09/24/2023 09:16 AM   Modules accepted: Orders

## 2023-09-24 NOTE — Progress Notes (Signed)
Cardiology Office Note:    Date:  09/24/2023  ID:  Leah Pierce, DOB 03/13/1951, MRN [id number]  PCP:  [place], Betty G, MD  Cardiologist:  Parke Poisson, MD  Electrophysiologist:  None   Referring MD: [place], Betty G, MD   Chief Complaint/Reason for Referral: MV disease, stroke, HFrEF  History of Present Illness:    Leah Pierce is a 72 y.o. female with a history of acute systolic HF, mitral valve regurgitation with abnormal appearing mitral valve, CVA, HLD, prediabetes, asthma. Presents for follow up.   09/24/23: She is overall well with continued class I heart failure symptoms.  Last echocardiogram showed moderate MR and mildly reduced ejection fraction.  Recall she had an MRI December 2022 read by Dr. Bjorn Pippin demonstrating moderate to severe mitral valve regurgitation with a regurgitant fraction of 39%.  She does however note that she has an increased sense of fatigue over the last 6 months and does not feel like doing as much as she used to.  Saw her primary doctor yesterday with no significant concerns but did not mention the fatigue.  She does not have a recent TSH.  She does take vitamin D supplement.  No worsening heart failure symptoms but fatigue certainly warrants repeat echocardiogram.  No chest pain no shortness of breath no PND or orthopnea.  No palpitations.  Prior visits: I initially met the patient when she was admitted for stroke and was noted to have a new cardiomyopathy and abnormal mitral valve.  She had a viral infection in the first part of April 2022, preceding stroke and HF diagnosis. Unclear if CM represents viral etiology.   Reviewed TTE from 08/06/21 and TEE from 03/18/21. MR is at least moderate severe. Mechanism appears mixed with some primary leaflet dysfunction of the anterior leaflet, and secondary MR with leaflet restriction, quite notable with the secondary chords attaching to the anterior leaflet. EF appears 40% on most recent TTE.    Historically she reported occasional paroxysms of shortness of breath for which she used her rescue inhaler prescribed by Dr. [place].  She does have a diagnosis of asthma since childhood, but she was not having to frequently use her inhaler.  She was NYHA class I symptoms for the most part, occasionally class II.   Family history of heart disease.  It did sound like her brother had a cardiomyopathy for which he saw Dr. Gala Romney.  She noted a strong family history of heart disease and wondered if some of her heart trouble could be genetic. We discussed in great detail the natural history of cardiomyopathy and the work-up involved, including initially ruling out ischemic heart disease and subsequently pursuing a valvular, postviral, or genetic cardiomyopathic process.  At her 09/2021 visit, she was feeling well overall with NYHA class I symptoms.  Continued to work at [employer] and stay active.  Coronary CTA performed to evaluate nature of cardiomyopathy minimal coronary artery disease.  Due to technical difficulties the mitral valve was incompletely assessed on cardiac CT despite my request for retrospective gating.  Nevertheless, report suggests and her mitral leaflet is thickened and possibly represents prior inflammatory pathology, mechanism of MR felt to be tethering and restricted movement of the posterior mitral leaflet in systole, Carpentier type IIIb.  She saw Dr. Bjorn Pippin 10/2021, and spironolactone 12.5 mg was added to her regimen. She had presented to urgent care 12/10/2021 with left shoulder and LUE pain lasting for about 3 days, exacerbated by movement. She initially woke up  with the pain, and denied any apparent injury. No suspicion for DVT, physical exam was consistent with muscular strain. She followed up with our pharmacist 01/02/2022 and was well; felt like she had no restrictions on her ability to move around.  At her visit 03/10/22, she reported well controlled home blood  pressures. Her weight was stable (129-134 lb). Coronary CTA performed with minimal CAD.  Cardiac MRI showed Mild nonischemic LV dysfunction and moderate-severe MR. Surprisingly, no significant murmur on exam, suspect very eccentric jet. Planned to follow with echo in 6 months. LDL was less than 70, continue atorvastatin 20 mg daily. Minimal CAD. Cerebrovascular accident (CVA) due to embolism of left carotid artery followed closely by neurology.  Agreed that Eliquis was no longer indicated from cardiac standpoint.  Continued aspirin 81 mg daily.  On 09/01/22 she was doing really well. Continued to work without symptoms. NYHA class I sx. Reviewed recent echo with stable mod-severe MR. I suspected the valve will need to be fixed eventually. Mild LA dilation. LV normal size but reduced EF by echo and MRI. No arrhythmia/afib on monitor. Stroke was felt to be to large vessel disease vs cardiomyopathy.  When she is very active (working more part time hours) and feeling extremely tired, she may feel palpitations. At those times she is more aware of feeling her heart pumping harder than usual. She denies any associated chest pain or shortness of breath.  She denies any recent changes to her medications.  She denies any peripheral edema. No lightheadedness, headaches, syncope, orthopnea, or PND.  Past Medical History:  Diagnosis Date   Allergy    Arthritis    Asthma    hx of   Chicken pox    Heart murmur    Migraines    Stroke [facility])     Past Surgical History:  Procedure Laterality Date   BUBBLE STUDY  03/18/2021   Procedure: BUBBLE STUDY;  Surgeon: Lewayne Bunting, MD;  Location: [facility] ENDOSCOPY;  Service: Cardiovascular;;   CESAREAN SECTION     COLONOSCOPY     TEE WITHOUT CARDIOVERSION N/A 03/18/2021   Procedure: TRANSESOPHAGEAL ECHOCARDIOGRAM (TEE);  Surgeon: Lewayne Bunting, MD;  Location: [facility] ENDOSCOPY;  Service: Cardiovascular;  Laterality: N/A;   TONSILLECTOMY  1959      Allergies:    Tetracyclines & related   Social History   Tobacco Use   Smoking status: Never   Smokeless tobacco: Never  Vaping Use   Vaping status: Never Used  Substance Use Topics   Alcohol use: Yes    Comment: occasionally   Drug use: Yes    Frequency: 1.0 times per week    Types: Marijuana     Family History: The patient's family history includes Arthritis in her father and mother; Cancer in her brother, brother, and father; Colon polyps in her mother; Diabetes in her mother; Hypertension in her mother; Stroke in her mother. There is no history of Colon cancer, Esophageal cancer, Rectal cancer, or Stomach cancer.  ROS:   Please see the history of present illness.    All other systems reviewed and are negative.  EKGs/Labs/Other Studies Reviewed:    The following studies were reviewed today:  Echocardiogram  03/02/2023:  1. Left ventricular ejection fraction, by estimation, is 45 to 50%. The  left ventricle has mildly decreased function. The left ventricle  demonstrates global hypokinesis. Left ventricular diastolic parameters  were normal.   2. Right ventricular systolic function is normal. The right ventricular  size is  normal.   3. The mitral valve is myxomatous. Moderate mitral valve regurgitation.  No evidence of mitral stenosis. There is mild holosystolic prolapse of the  middle segment of the anterior leaflet of the mitral valve.   4. The aortic valve is tricuspid. Aortic valve regurgitation is not  visualized. No aortic stenosis is present.   5. The inferior vena cava is normal in size with greater than 50%  respiratory variability, suggesting right atrial pressure of 3 mmHg.   Comparison(s): No significant change from prior study. Prior images  reviewed side by side.    Cardiac MRI 10/21/2021: IMPRESSION: 1. Normal LV size, mild hypertrophy, and mild systolic dysfunction (EF 45%). Apical hypertrabeculation.   2.  Normal RV size and systolic function (EF 52%)   3.  No  late gadolinium enhancement to suggest myocardial scar   4. Moderate to severe mitral regurgitation (regurgitant fraction 39%)  Bilateral Carotid Duplex 09/18/2021: Summary:  Right Carotid: Velocities in the right ICA are consistent with a 1-39%  stenosis.   Left Carotid: Velocities in the left ICA are consistent with a 1-39%  stenosis.   Cardiac CTA 09/05/2021:  IMPRESSION: 1. Coronary calcium score of 5.7. This was 57th percentile for age-, sex, and race-matched controls.   2. Normal coronary origin with right dominance.   3. Minimal CAD (<25%) in the LAD/RCA.   4. Non-ischemic cardiomyopathy.   5. Limited evaluation of the mitral valve as a prospective acquisition was obtained. The AMVL is thickened and possible represents prior inflammatory pathology. No prolapse noted. Current mechanism of MR is related to tethering and restricted movement of the PMVL in systole (IIIB).   EKG:  EKG is personally reviewed. EKG Interpretation Date/Time:  Thursday September 24 2023 08:25:11 EST Ventricular Rate:  75 PR Interval:  152 QRS Duration:  78 QT Interval:  370 QTC Calculation: 413 R Axis:   37  Text Interpretation: Normal sinus rhythm T wave abnormality, consider inferolateral ischemia Confirmed by Weston Brass ([id number]) on 09/24/2023 8:48:26 AM   03/13/2023:  Absent V3 due to poor data quality. Sinus rhythm. Nonspecific T wave abnormality. Unchanged from prior. 09/01/22: NSR, T wave abnl inferolateral unchanged 03/10/2022: Sinus rhythm. LAE. Nonspecific T wave abnormality. 08/27/21: NSR, possible LAE, nonspecific T wave abnl.  Imaging studies that I have independently reviewed today: TTE 08/06/21 and TEE 03/18/21, CCTA 09/05/21  Recent Labs: 08/04/2023: ALT 16; BUN 14; Creatinine, Ser 0.84; Potassium 4.3; Sodium 139   Recent Lipid Panel    Component Value Date/Time   CHOL 161 08/04/2023 1151   CHOL 154 06/06/2021 0821   TRIG 69.0 08/04/2023 1151   HDL 82.60 08/04/2023  1151   HDL 83 06/06/2021 0821   CHOLHDL 2 08/04/2023 1151   VLDL 13.8 08/04/2023 1151   LDLCALC 64 08/04/2023 1151   LDLCALC 60 06/06/2021 0821    Physical Exam:    VS:  BP 124/74 (BP Location: Left Arm, Patient Position: Sitting)   Pulse 79   Wt 134 lb 3.2 oz (60.9 kg)   SpO2 98%   BMI 24.55 kg/m     Wt Readings from Last 5 Encounters:  09/24/23 134 lb 3.2 oz (60.9 kg)  09/23/23 134 lb (60.8 kg)  08/04/23 135 lb 4 oz (61.3 kg)  07/29/23 129 lb 14.4 oz (58.9 kg)  03/13/23 135 lb 3.2 oz (61.3 kg)    Constitutional: No acute distress Eyes: sclera non-icteric, normal conjunctiva and lids ENMT: normal dentition, moist mucous membranes Cardiovascular: regular rhythm, normal rate,  1/6 HSM. S1 and S2 normal. No jugular venous distention.  Respiratory: clear to auscultation bilaterally GI : normal bowel sounds, soft and nontender. No distention.   MSK: extremities warm, well perfused. No edema.  NEURO: grossly nonfocal exam, moves all extremities. PSYCH: alert and oriented x 3, normal mood and affect.   ASSESSMENT:    1. HFrEF (heart failure with reduced ejection fraction) (HCC)   2. Chronic combined systolic (congestive) and diastolic (congestive) heart failure (HCC)   3. Hyperlipidemia LDL goal <70   4. Mitral valve prolapse   5. Nonrheumatic mitral valve regurgitation   6. Essential (primary) hypertension   7. Cerebrovascular accident (CVA) due to embolism of left carotid artery (HCC)   8. Bilateral carotid artery stenosis      PLAN:    Chronic combined systolic and diastolic CHF (congestive heart failure) (HCC) -  HFrEF (heart failure with reduced ejection fraction) (HCC) - Fatigue -Coronary CTA performed with minimal CAD.  Cardiac MRI showed Mild nonischemic LV dysfunction and moderate-severe MR. Surprisingly, no significant murmur on exam, suspect very eccentric jet.  Echocardiogram performed 03/02/2023 demonstrates continued mild reduction of LVEF approximately 45 to  50% and mild to moderate MR, again no significant murmur on exam today, likely eccentric jet. -Due to fatigue, will repeat echo and obtain TSH. Heart Failure Therapy - continue at current doses ACE-I/ARB/ARNI: losartan 25 mg daily BB: metoprolol succinate 12.5 mg daily MRA: spironolactone 12.5 mg daily SGLT2I: jardience 10 mg daily Diuretic plan: no loop diuretic required. NYHA class I symptoms.  Nonrheumatic mitral valve regurgitation, mod severe by quantitation-  -Moderate to severe mitral valve regurgitation by MRI.  Mixed etiology.  Will follow with echo. With reduced LV function, may be reasonable to pursue surgical consultation for opinion after next follow up. Unsure if valve is repairable given abnormal appearance. -She has class I NYHA symptoms and no significant palpitations.  Did note some palpitations since our last visit, if these increase, consider cardiac monitor to screen for atrial fibrillation.  If atrial fibrillation has developed in the interim, would consider surgical referral for management of mitral valve disease as this likely indicates progression. -Repeat echo in the setting of fatigue  Hyperlipidemia LDL goal <70 -Most recent LDL is less than 70, continue atorvastatin 20 mg daily. Minimal CAD.  Cerebrovascular accident (CVA) due to embolism of left carotid artery (HCC) -Followed closely by neurology.   Continue aspirin 81 mg daily.  Essential (primary) hypertension-blood pressure is low normal and meds have been well titrated.   Follow-up:  6 months  Total time of encounter: 30 minutes total time of encounter, including 20 minutes spent in face-to-face patient care on the date of this encounter. This time includes coordination of care and counseling regarding above mentioned problem list. Remainder of non-face-to-face time involved reviewing chart documents/testing relevant to the patient encounter and documentation in the medical record. I have independently  reviewed documentation from referring provider.   Weston Brass, MD, [facility] [place]  [facility]   Medication Adjustments/Labs and Tests Ordered: Current medicines are reviewed at length with the patient today.  Concerns regarding medicines are outlined above.   Orders Placed This Encounter  Procedures   EKG 12-Lead   No orders of the defined types were placed in this encounter.  Patient Instructions  Medication Instructions:  - No changes  *If you need a refill on your cardiac medications before your next appointment, please call your pharmacy*   Lab Work: - TSH today  If you have labs (blood work) drawn today and your tests are completely normal, you will receive your results only by: MyChart Message (if you have MyChart) OR A paper copy in the mail If you have any lab test that is abnormal or we need to change your treatment, we will call you to review the results.   Testing/Procedures: - Echo ordered today (please schedule next available)  Your physician has requested that you have an echocardiogram. Echocardiography is a painless test that uses sound waves to create images of your heart. It provides your doctor with information about the size and shape of your heart and how well your heart's chambers and valves are working. This procedure takes approximately one hour. There are no restrictions for this procedure. Please do NOT wear cologne, perfume, aftershave, or lotions (deodorant is allowed). Please arrive 15 minutes prior to your appointment time.  Please note: We ask at that you not bring children with you during ultrasound (echo/ vascular) testing. Due to room size and safety concerns, children are not allowed in the ultrasound rooms during exams. Our front office staff cannot provide observation of children in our lobby area while testing is being conducted. An adult accompanying a patient to their appointment will only be allowed in the ultrasound room at the  discretion of the ultrasound technician under special circumstances. We apologize for any inconvenience.    Follow-Up: At [facility], you and your health needs are our priority.  As part of our continuing mission to provide you with exceptional heart care, we have created designated Provider Care Teams.  These Care Teams include your primary Cardiologist (physician) and Advanced Practice Providers (APPs -  Physician Assistants and Nurse Practitioners) who all work together to provide you with the care you need, when you need it.  We recommend signing up for the patient portal called "MyChart".  Sign up information is provided on this After Visit Summary.  MyChart is used to connect with patients for Virtual Visits (Telemedicine).  Patients are able to view lab/test results, encounter notes, upcoming appointments, etc.  Non-urgent messages can be sent to your provider as well.   To learn more about what you can do with MyChart, go to [email or web address].    Your next appointment:   6 month(s)  Provider:   Parke Poisson, MD

## 2023-09-24 NOTE — Patient Instructions (Signed)
Medication Instructions:  - No changes  *If you need a refill on your cardiac medications before your next appointment, please call your pharmacy*   Lab Work: - TSH today  If you have labs (blood work) drawn today and your tests are completely normal, you will receive your results only by: MyChart Message (if you have MyChart) OR A paper copy in the mail If you have any lab test that is abnormal or we need to change your treatment, we will call you to review the results.   Testing/Procedures: - Echo ordered today (please schedule next available)  Your physician has requested that you have an echocardiogram. Echocardiography is a painless test that uses sound waves to create images of your heart. It provides your doctor with information about the size and shape of your heart and how well your heart's chambers and valves are working. This procedure takes approximately one hour. There are no restrictions for this procedure. Please do NOT wear cologne, perfume, aftershave, or lotions (deodorant is allowed). Please arrive 15 minutes prior to your appointment time.  Please note: We ask at that you not bring children with you during ultrasound (echo/ vascular) testing. Due to room size and safety concerns, children are not allowed in the ultrasound rooms during exams. Our front office staff cannot provide observation of children in our lobby area while testing is being conducted. An adult accompanying a patient to their appointment will only be allowed in the ultrasound room at the discretion of the ultrasound technician under special circumstances. We apologize for any inconvenience.    Follow-Up: At [facility], you and your health needs are our priority.  As part of our continuing mission to provide you with exceptional heart care, we have created designated Provider Care Teams.  These Care Teams include your primary Cardiologist (physician) and Advanced Practice Providers (APPs -   Physician Assistants and Nurse Practitioners) who all work together to provide you with the care you need, when you need it.  We recommend signing up for the patient portal called "MyChart".  Sign up information is provided on this After Visit Summary.  MyChart is used to connect with patients for Virtual Visits (Telemedicine).  Patients are able to view lab/test results, encounter notes, upcoming appointments, etc.  Non-urgent messages can be sent to your provider as well.   To learn more about what you can do with MyChart, go to [email or web address].    Your next appointment:   6 month(s)  Provider:   Parke Poisson, MD

## 2023-09-25 LAB — TSH: TSH: 0.481 u[IU]/mL (ref 0.450–4.500)

## 2023-10-07 ENCOUNTER — Telehealth: Payer: Self-pay | Admitting: *Deleted

## 2023-10-07 NOTE — Telephone Encounter (Signed)
Spoke to pt regarding TSH WNL labs.  She would like a text reminder about upcoming Echo on 11/02/23.  States she was waiting to hear back from [place] about scheduling the Echo.

## 2023-11-02 ENCOUNTER — Ambulatory Visit (HOSPITAL_COMMUNITY): Payer: 59 | Attending: Internal Medicine

## 2023-11-02 DIAGNOSIS — I341 Nonrheumatic mitral (valve) prolapse: Secondary | ICD-10-CM | POA: Insufficient documentation

## 2023-11-02 DIAGNOSIS — I340 Nonrheumatic mitral (valve) insufficiency: Secondary | ICD-10-CM | POA: Diagnosis not present

## 2023-11-02 LAB — ECHOCARDIOGRAM COMPLETE
Area-P 1/2: 3.65 cm2
MV M vel: 5.41 m/s
MV Peak grad: 117.1 mm[Hg]
Radius: 0.7 cm
S' Lateral: 3.6 cm

## 2023-11-02 MED ORDER — PERFLUTREN LIPID MICROSPHERE
1.0000 mL | INTRAVENOUS | Status: AC | PRN
  Administered 2023-11-02: 2 mL via INTRAVENOUS

## 2023-11-06 ENCOUNTER — Other Ambulatory Visit (HOSPITAL_COMMUNITY): Payer: Self-pay | Admitting: *Deleted

## 2023-11-06 DIAGNOSIS — I5020 Unspecified systolic (congestive) heart failure: Secondary | ICD-10-CM

## 2023-11-06 DIAGNOSIS — I340 Nonrheumatic mitral (valve) insufficiency: Secondary | ICD-10-CM

## 2024-04-27 ENCOUNTER — Other Ambulatory Visit: Payer: Self-pay | Admitting: Family Medicine

## 2024-04-27 DIAGNOSIS — I5020 Unspecified systolic (congestive) heart failure: Secondary | ICD-10-CM

## 2024-05-10 ENCOUNTER — Ambulatory Visit (HOSPITAL_COMMUNITY)
Admission: RE | Admit: 2024-05-10 | Discharge: 2024-05-10 | Disposition: A | Discharge status: Home / Self Care | Source: Ambulatory Visit | Attending: Internal Medicine | Admitting: Internal Medicine

## 2024-05-10 DIAGNOSIS — I5020 Unspecified systolic (congestive) heart failure: Secondary | ICD-10-CM

## 2024-05-10 DIAGNOSIS — I340 Nonrheumatic mitral (valve) insufficiency: Secondary | ICD-10-CM | POA: Diagnosis not present

## 2024-05-10 LAB — ECHOCARDIOGRAM COMPLETE
Area-P 1/2: 3.5 cm2
MV M vel: 5.65 m/s
MV Peak grad: 127.7 mmHg
S' Lateral: 3.03 cm

## 2024-05-12 ENCOUNTER — Ambulatory Visit: Payer: 59 | Attending: Internal Medicine | Admitting: Internal Medicine

## 2024-05-12 ENCOUNTER — Encounter: Payer: Self-pay | Admitting: Internal Medicine

## 2024-05-12 VITALS — BP 130/86 | HR 69 | Ht 62.0 in | Wt 127.0 lb

## 2024-05-12 DIAGNOSIS — I10 Essential (primary) hypertension: Secondary | ICD-10-CM

## 2024-05-12 DIAGNOSIS — E785 Hyperlipidemia, unspecified: Secondary | ICD-10-CM

## 2024-05-12 DIAGNOSIS — I5032 Chronic diastolic (congestive) heart failure: Secondary | ICD-10-CM

## 2024-05-12 DIAGNOSIS — I63132 Cerebral infarction due to embolism of left carotid artery: Secondary | ICD-10-CM | POA: Diagnosis not present

## 2024-05-12 DIAGNOSIS — I340 Nonrheumatic mitral (valve) insufficiency: Secondary | ICD-10-CM | POA: Diagnosis not present

## 2024-05-12 DIAGNOSIS — I341 Nonrheumatic mitral (valve) prolapse: Secondary | ICD-10-CM

## 2024-05-12 NOTE — Progress Notes (Signed)
 Cardiology Office Note:  .   Date:  05/12/2024  ID:  Leah Pierce, DOB [birth year], MRN [id number] PCP: [place], Betty G, MD  [facility] Providers Cardiologist:  Soyla DELENA Merck, MD    History of Present Illness: Leah   Kyanne Pierce is a 73 y.o. female.  Discussed the use of AI scribe software for clinical note transcription with the patient, who gave verbal consent to proceed.  History of Present Illness Leah Pierce is a 73 year old female with myxomatous mitral valve disease who presents for follow-up of her mitral valve regurgitation.  She is followed for myxomatous mitral valve disease with moderate to severe mitral regurgitation. Her recent echocardiogram shows a preserved ejection fraction of 50-55%, consistent with previous results. A cardiac MRI previously indicated borderline reduced cardiac function at 45%.  She experiences occasional non-painful chest fluttering, primarily when in bed. A heart monitor in 2022 was negative for atrial fibrillation, and she does not have regular symptoms of palpitations  Her blood pressure occasionally elevates, particularly with poor sleep, with a recent reading of 141/85 mmHg. Readings are overall well controlled in 120s-130s/70s. She manages symptoms with compression socks during long work hours. Her medications include aspirin , atorvastatin , Jardiance , losartan , metoprolol  succinate, and spironolactone .    ROS: negative except per HPI above.  Studies Reviewed: Leah   EKG Interpretation Date/Time:  Thursday May 12 2024 14:02:43 EDT Ventricular Rate:  69 PR Interval:  146 QRS Duration:  78 QT Interval:  406 QTC Calculation: 435 R Axis:   49  Text Interpretation: Normal sinus rhythm Nonspecific T wave abnormality When compared with ECG of 24-Sep-2023 08:25, Nonspecific T wave abnormality has replaced inverted T waves in Inferior leads Nonspecific T wave abnormality has replaced inverted T waves in Lateral  leads Confirmed by Merck Soyla ([id number]) on 05/12/2024 2:11:48 PM    Results LABS LDL: 64 (07/2023) HDL: 82 (07/2023) Triglycerides: 69 (07/2023)  RADIOLOGY Cardiac MR: Borderline reduced cardiac function at 45%  DIAGNOSTIC Echocardiogram: Myxomatous mitral valve with no flail segments, EF 50-55% (04/2024) Risk Assessment/Calculations:       Physical Exam:   VS:  BP 130/86 (BP Location: Right Arm, Patient Position: Sitting, Cuff Size: Normal)   Pulse 69   Ht 5' 2 (1.575 m)   Wt 127 lb (57.6 kg)   SpO2 98%   BMI 23.23 kg/m    Wt Readings from Last 3 Encounters:  05/12/24 127 lb (57.6 kg)  09/24/23 134 lb 3.2 oz (60.9 kg)  09/23/23 134 lb (60.8 kg)     Physical Exam MEASUREMENTS: Weight- 127. GENERAL: Alert, cooperative, well developed, no acute distress HEENT: Normocephalic, normal oropharynx, moist mucous membranes CHEST: Clear to auscultation bilaterally, No wheezes, rhonchi, or crackles CARDIOVASCULAR: Normal heart rate and rhythm, S1 and S2 normal without murmurs ABDOMEN: Soft, non-tender, non-distended, without organomegaly, Normal bowel sounds EXTREMITIES: No cyanosis or edema NEUROLOGICAL: Cranial nerves grossly intact, Moves all extremities without gross motor or sensory deficit   ASSESSMENT AND PLAN: .    Assessment and Plan Assessment & Plan Myxomatous mitral valve disease with moderate to severe mitral regurgitation Condition stable with preserved ejection fraction at 50-55%. Asymptomatic with occasional palpitations not requiring intervention. - Continue current management and monitoring. - Re-evaluate with echocardiogram in one year unless symptoms change. - Monitor for increased symptoms such as fatigue or palpitations. - as always, surprisingly discordant exam with echo, as there is minimal murmur.  Hypertension HF with recovered EF Hypertension well-controlled with occasional  elevations related to fatigue or stress. - continue jardiance  10 mg  daily. - Continue losartan  25 mg daily. - Continue metoprolol  succinate 12.5 mg daily. - Continue spironolactone  25 mg daily. - Monitor blood pressure regularly.  Stroke History of stroke with well-controlled risk factors. Excellent cholesterol levels. - Continue aspirin  81 mg daily. - Continue atorvastatin  20 mg daily. - Monitor cholesterol levels as needed.      Soyla Merck, MD, FACC

## 2024-05-12 NOTE — Patient Instructions (Signed)
 Medication Instructions:  No Changes *If you need a refill on your cardiac medications before your next appointment, please call your pharmacy*  Lab Work: None  Testing/Procedures: Your physician has requested that you have an echocardiogram in about one year (due 05/12/2025). Echocardiography is a painless test that uses sound waves to create images of your heart. It provides your doctor with information about the size and shape of your heart and how well your heart's chambers and valves are working. This procedure takes approximately one hour. There are no restrictions for this procedure. Please do NOT wear cologne, perfume, aftershave, or lotions (deodorant is allowed). Please arrive 15 minutes prior to your appointment time.  Please note: We ask at that you not bring children with you during ultrasound (echo/ vascular) testing. Due to room size and safety concerns, children are not allowed in the ultrasound rooms during exams. Our front office staff cannot provide observation of children in our lobby area while testing is being conducted. An adult accompanying a patient to their appointment will only be allowed in the ultrasound room at the discretion of the ultrasound technician under special circumstances. We apologize for any inconvenience.   Follow-Up: At [facility], you and your health needs are our priority.  As part of our continuing mission to provide you with exceptional heart care, our providers are all part of one team.  This team includes your primary Cardiologist (physician) and Advanced Practice Providers or APPs (Physician Assistants and Nurse Practitioners) who all work together to provide you with the care you need, when you need it.  Your next appointment:   6 month(s)  Provider:   Gayatri A Acharya, MD   We recommend signing up for the patient portal called MyChart.  Sign up information is provided on this After Visit Summary.  MyChart is used to connect with  patients for Virtual Visits (Telemedicine).  Patients are able to view lab/test results, encounter notes, upcoming appointments, etc.  Non-urgent messages can be sent to your provider as well.   To learn more about what you can do with MyChart, go to [email or web address].   Other Instructions Please call us  or send a MyChart message with any Cardiology related questions/concerns.  [phone].  Thank you!

## 2024-06-09 DIAGNOSIS — H04123 Dry eye syndrome of bilateral lacrimal glands: Secondary | ICD-10-CM | POA: Diagnosis not present

## 2024-06-09 DIAGNOSIS — H43393 Other vitreous opacities, bilateral: Secondary | ICD-10-CM | POA: Diagnosis not present

## 2024-06-17 ENCOUNTER — Other Ambulatory Visit: Payer: Self-pay | Admitting: Family Medicine

## 2024-06-17 DIAGNOSIS — Z1231 Encounter for screening mammogram for malignant neoplasm of breast: Secondary | ICD-10-CM

## 2024-07-25 ENCOUNTER — Ambulatory Visit
Admission: RE | Admit: 2024-07-25 | Discharge: 2024-07-25 | Disposition: A | Discharge status: Home / Self Care | Source: Ambulatory Visit | Attending: Family Medicine | Admitting: Family Medicine

## 2024-07-25 DIAGNOSIS — Z1231 Encounter for screening mammogram for malignant neoplasm of breast: Secondary | ICD-10-CM | POA: Diagnosis not present

## 2024-08-01 ENCOUNTER — Ambulatory Visit: Payer: 59

## 2024-08-01 VITALS — Ht 62.0 in | Wt 127.0 lb

## 2024-08-01 DIAGNOSIS — Z0000 Encounter for general adult medical examination without abnormal findings: Secondary | ICD-10-CM

## 2024-08-01 NOTE — Patient Instructions (Addendum)
 Ms. Keir,  Thank you for taking the time for your Medicare Wellness Visit. I appreciate your continued commitment to your health goals. Please review the care plan we discussed, and feel free to reach out if I can assist you further.  Medicare recommends these wellness visits once per year to help you and your care team stay ahead of potential health issues. These visits are designed to focus on prevention, allowing your provider to concentrate on managing your acute and chronic conditions during your regular appointments.  Please note that Annual Wellness Visits do not include a physical exam. Some assessments may be limited, especially if the visit was conducted virtually. If needed, we may recommend a separate in-person follow-up with your provider.  Ongoing Care Seeing your primary care provider every 3 to 6 months helps us  monitor your health and provide consistent, personalized care.   Referrals If a referral was made during today's visit and you haven't received any updates within two weeks, please contact the referred provider directly to check on the status.  Recommended Screenings:  Health Maintenance  Topic Date Due   COVID-19 Vaccine (6 - Pfizer risk 2024-25 season) 07/18/2024   Breast Cancer Screening  07/25/2025   Medicare Annual Wellness Visit  08/01/2025   Colon Cancer Screening  06/02/2027   Pneumococcal Vaccine for age over 69  Completed   DEXA scan (bone density measurement)  Completed   Hepatitis C Screening  Completed   HPV Vaccine  Aged Out   Meningitis B Vaccine  Aged Out   DTaP/Tdap/Td vaccine  Discontinued   Flu Shot  Discontinued   Zoster (Shingles) Vaccine  Discontinued       08/01/2024    1:44 PM  Advanced Directives  Does Patient Have a Medical Advance Directive? Yes  Type of Estate agent of [place];Living will  Copy of Healthcare Power of Attorney in Chart? No - copy requested   Advance Care Planning is important because  it: Ensures you receive medical care that aligns with your values, goals, and preferences. Provides guidance to your family and loved ones, reducing the emotional burden of decision-making during critical moments.  Vision: Annual vision screenings are recommended for early detection of glaucoma, cataracts, and diabetic retinopathy. These exams can also reveal signs of chronic conditions such as diabetes and high blood pressure.  Dental: Annual dental screenings help detect early signs of oral cancer, gum disease, and other conditions linked to overall health, including heart disease and diabetes.  Please see the attached documents for additional preventive care recommendations.

## 2024-08-01 NOTE — Progress Notes (Signed)
 Subjective:   Leah Pierce is a 73 y.o. who presents for a Medicare Wellness preventive visit.  As a reminder, Annual Wellness Visits don't include a physical exam, and some assessments may be limited, especially if this visit is performed virtually. We may recommend an in-person follow-up visit with your provider if needed.  Visit Complete: Virtual I connected with  Leah Pierce on 08/01/24 by a audio enabled telemedicine application and verified that I am speaking with the correct person using two identifiers.  Patient Location: Home  Provider Location: Home Office  I discussed the limitations of evaluation and management by telemedicine. The patient expressed understanding and agreed to proceed.  Vital Signs: Because this visit was a virtual/telehealth visit, some criteria may be missing or patient reported. Any vitals not documented were not able to be obtained and vitals that have been documented are patient reported.    Persons Participating in Visit: Patient.  AWV Questionnaire: No: Patient Medicare AWV questionnaire was not completed prior to this visit.  Cardiac Risk Factors include: advanced age (>41men, >33 women);hypertension     Objective:    Today's Vitals   08/01/24 1339  Weight: 127 lb (57.6 kg)  Height: 5' 2 (1.575 m)   Body mass index is 23.23 kg/m.     08/01/2024    1:44 PM 07/29/2023    1:49 PM 07/24/2022    2:29 PM 07/19/2021   11:29 AM 03/27/2021   11:05 AM 03/18/2021   11:21 AM 03/15/2021    5:40 PM  Advanced Directives  Does Patient Have a Medical Advance Directive? Yes No No No No No No  Type of Estate agent of [place];Living will        Copy of Healthcare Power of Attorney in Chart? No - copy requested        Would patient like information on creating a medical advance directive?  No - Patient declined No - Patient declined No - Patient declined Yes (MAU/Ambulatory/Procedural Areas - Information given) No -  Patient declined Yes (Inpatient - patient requests chaplain consult to create a medical advance directive)    Current Medications (verified) Outpatient Encounter Medications as of 08/01/2024  Medication Sig   albuterol  (VENTOLIN  HFA) 108 (90 Base) MCG/ACT inhaler Inhale 2 puffs into the lungs every 6 (six) hours as needed for wheezing or shortness of breath.   alendronate  (FOSAMAX ) 70 MG tablet Take 1 tablet (70 mg total) by mouth every 7 (seven) days. Take with a full glass of water on an empty stomach.   aspirin  EC 81 MG tablet Take 1 tablet (81 mg total) by mouth daily. Swallow whole.   atorvastatin  (LIPITOR) 20 MG tablet Take 1 tablet (20 mg total) by mouth daily.   cholecalciferol  (VITAMIN D3) 25 MCG (1000 UNIT) tablet Take 1,000 Units by mouth daily.   empagliflozin  (JARDIANCE ) 10 MG TABS tablet Take 1 tablet (10 mg total) by mouth daily. Due for follow up.   fluticasone  (FLOVENT  HFA) 110 MCG/ACT inhaler Inhale 1 puff into the lungs in the morning and at bedtime.   losartan  (COZAAR ) 25 MG tablet Take 1 tablet (25 mg total) by mouth daily. Due for follow up   metoprolol  succinate (TOPROL -XL) 25 MG 24 hr tablet TAKE 1/2 TABLET BY MOUTH EVERY DAY   Misc Natural Products (OSTEO BI-FLEX ADV JOINT SHIELD) TABS Take 1 tablet by mouth daily.   OVER THE COUNTER MEDICATION Take 1 tablet by mouth daily. NEURIVA   Spacer/Aero-Holding Chambers (AEROCHAMBER  PLUS) inhaler Use as instructed to use with inahaler.   spironolactone  (ALDACTONE ) 25 MG tablet TAKE 1/2 TABLET BY MOUTH EVERY DAY   vitamin C (ASCORBIC ACID ) 500 MG tablet Take 500 mg by mouth daily.   vitamin E 200 UNIT capsule Take 200 Units by mouth daily.   No facility-administered encounter medications on file as of 08/01/2024.    Allergies (verified) Tetracyclines & related   History: Past Medical History:  Diagnosis Date   Allergy    Arthritis    Asthma    hx of   Chicken pox    Heart murmur    Migraines    Stroke [facility])     Past Surgical History:  Procedure Laterality Date   BUBBLE STUDY  03/18/2021   Procedure: BUBBLE STUDY;  Surgeon: Pietro Redell RAMAN, MD;  Location: [facility] ENDOSCOPY;  Service: Cardiovascular;;   CESAREAN SECTION     COLONOSCOPY     TEE WITHOUT CARDIOVERSION N/A 03/18/2021   Procedure: TRANSESOPHAGEAL ECHOCARDIOGRAM (TEE);  Surgeon: Pietro Redell RAMAN, MD;  Location: [facility] ENDOSCOPY;  Service: Cardiovascular;  Laterality: N/A;   TONSILLECTOMY  1959   Family History  Problem Relation Age of Onset   Arthritis Mother    Stroke Mother    Hypertension Mother    Diabetes Mother    Colon polyps Mother    Arthritis Father    Cancer Father        Prostate   Cancer Brother        prostate   Cancer Brother        Pancreas   Colon cancer Neg Hx    Esophageal cancer Neg Hx    Rectal cancer Neg Hx    Stomach cancer Neg Hx    Social History   Socioeconomic History   Marital status: Single    Spouse name: Not on file   Number of children: 1   Years of education: Not on file   Highest education level: Not on file  Occupational History   Not on file  Tobacco Use   Smoking status: Never   Smokeless tobacco: Never  Vaping Use   Vaping status: Never Used  Substance and Sexual Activity   Alcohol use: Yes    Comment: occasionally   Drug use: Yes    Frequency: 1.0 times per week    Types: Marijuana   Sexual activity: Not on file  Other Topics Concern   Not on file  Social History Narrative   Lives alone, her son dies   Right Handed   Drinks 1 cups caffeine daily   Social Drivers of Health   Financial Resource Strain: Low Risk  (08/01/2024)   Overall Financial Resource Strain (CARDIA)    Difficulty of Paying Living Expenses: Not hard at all  Food Insecurity: No Food Insecurity (08/01/2024)   Hunger Vital Sign    Worried About Running Out of Food in the Last Year: Never true    Ran Out of Food in the Last Year: Never true  Transportation Needs: No Transportation Needs (08/01/2024)    PRAPARE - Administrator, Civil Service (Medical): No    Lack of Transportation (Non-Medical): No  Physical Activity: Inactive (08/01/2024)   Exercise Vital Sign    Days of Exercise per Week: 0 days    Minutes of Exercise per Session: 0 min  Stress: No Stress Concern Present (08/01/2024)   [employer] of Occupational Health - Occupational Stress Questionnaire    Feeling of Stress: Not  at all  Social Connections: Moderately Integrated (08/01/2024)   Social Connection and Isolation Panel    Frequency of Communication with Friends and Family: More than three times a week    Frequency of Social Gatherings with Friends and Family: More than three times a week    Attends Religious Services: More than 4 times per year    Active Member of [employer] or Organizations: Yes    Attends Engineer, structural: More than 4 times per year    Marital Status: Divorced    Tobacco Counseling Counseling given: Not Answered    Clinical Intake:  Pre-visit preparation completed: Yes  Pain : No/denies pain     BMI - recorded: 23.23 Nutritional Status: BMI of 19-24  Normal Nutritional Risks: None Diabetes: No  Lab Results  Component Value Date   HGBA1C 5.9 (H) 03/16/2021   HGBA1C 5.9 08/10/2019   HGBA1C 5.9 04/21/2018     How often do you need to have someone help you when you read instructions, pamphlets, or other written materials from your doctor or pharmacy?: 1 - Never  Interpreter Needed?: No  Information entered by :: Rojelio Blush LPN   Activities of Daily Living     08/01/2024    1:44 PM  In your present state of health, do you have any difficulty performing the following activities:  Hearing? 0  Vision? 0  Difficulty concentrating or making decisions? 0  Walking or climbing stairs? 0  Dressing or bathing? 0  Doing errands, shopping? 0  Preparing Food and eating ? N  Using the Toilet? N  In the past six months, have you accidently leaked urine? N  Do  you have problems with loss of bowel control? N  Managing your Medications? N  Managing your Finances? N  Housekeeping or managing your Housekeeping? N    Patient Care Team: [place], Betty G, MD as PCP - General (Family Medicine) Loni Soyla LABOR, MD as PCP - Cardiology (Cardiology)  I have updated your Care Teams any recent Medical Services you may have received from other providers in the past year.     Assessment:   This is a routine wellness examination for Vauda.  Hearing/Vision screen Hearing Screening - Comments:: Denies hearing difficulties   Vision Screening - Comments:: Wears rx glasses - up to date with routine eye exams with  [facility]   Goals Addressed               This Visit's Progress     Increase physical activity (pt-stated)        Get more active.       Depression Screen     08/01/2024    1:43 PM 09/23/2023   12:20 PM 08/04/2023   11:01 AM 07/29/2023    1:47 PM 07/24/2022    2:25 PM 05/19/2022   11:02 AM 07/19/2021   11:31 AM  PHQ 2/9 Scores  PHQ - 2 Score 0 0 1 0 0 2 0  PHQ- 9 Score  0 1   5     Fall Risk     08/01/2024    1:44 PM 08/04/2023   11:01 AM 07/29/2023    1:48 PM 07/24/2022    2:29 PM 05/19/2022   11:02 AM  Fall Risk   Falls in the past year? 0 1 1 0 0  Number falls in past yr: 0 1 0 0 0  Injury with Fall? 0 0 0 0 0  Risk for fall  due to : No Fall Risks Other (Comment) No Fall Risks No Fall Risks   Follow up Falls evaluation completed Falls evaluation completed Falls prevention discussed Falls prevention discussed  Falls evaluation completed      Data saved with a previous flowsheet row definition    MEDICARE RISK AT HOME:  Medicare Risk at Home Any stairs in or around the home?: Yes If so, are there any without handrails?: No Home free of loose throw rugs in walkways, pet beds, electrical cords, etc?: Yes Adequate lighting in your home to reduce risk of falls?: Yes Life alert?: No Use of a cane, walker or w/c?: No Grab bars  in the bathroom?: Yes Shower chair or bench in shower?: Yes Elevated toilet seat or a handicapped toilet?: Yes  TIMED UP AND GO:  Was the test performed?  No  Cognitive Function: 6CIT completed        08/01/2024    1:45 PM 07/29/2023    1:49 PM 07/24/2022    2:30 PM  6CIT Screen  What Year? 0 points 0 points 0 points  What month? 0 points 0 points 0 points  What time? 0 points 0 points 0 points  Count back from 20 0 points 0 points 0 points  Months in reverse 0 points 0 points 0 points  Repeat phrase 0 points 0 points 0 points  Total Score 0 points 0 points 0 points    Immunizations Immunization History  Administered Date(s) Administered   Moderna Covid-19 Fall Seasonal Vaccine 15yrs & older 09/04/2022, 08/08/2023   PFIZER(Purple Top)SARS-COV-2 Vaccination 12/08/2019, 12/29/2019, 09/01/2020   Pneumococcal Conjugate-13 04/21/2018   Pneumococcal Polysaccharide-23 02/03/2020    Screening Tests Health Maintenance  Topic Date Due   COVID-19 Vaccine (6 - Pfizer risk 2024-25 season) 07/18/2024   Mammogram  07/25/2025   Medicare Annual Wellness (AWV)  08/01/2025   Colonoscopy  06/02/2027   Pneumococcal Vaccine: 50+ Years  Completed   DEXA SCAN  Completed   Hepatitis C Screening  Completed   HPV VACCINES  Aged Out   Meningococcal B Vaccine  Aged Out   DTaP/Tdap/Td  Discontinued   Influenza Vaccine  Discontinued   Zoster Vaccines- Shingrix  Discontinued    Health Maintenance Items Addressed:   Additional Screening:  Vision Screening: Recommended annual ophthalmology exams for early detection of glaucoma and other disorders of the eye. Is the patient up to date with their annual eye exam?  Yes  Who is the provider or what is the name of the office in which the patient attends annual eye exams? [facility]  Dental Screening: Recommended annual dental exams for proper oral hygiene  Community Resource Referral / Chronic Care Management: CRR required this visit?  No    CCM required this visit?  No   Plan:    I have personally reviewed and noted the following in the patient's chart:   Medical and social history Use of alcohol, tobacco or illicit drugs  Current medications and supplements including opioid prescriptions. Patient is not currently taking opioid prescriptions. Functional ability and status Nutritional status Physical activity Advanced directives List of other physicians Hospitalizations, surgeries, and ER visits in previous 12 months Vitals Screenings to include cognitive, depression, and falls Referrals and appointments  In addition, I have reviewed and discussed with patient certain preventive protocols, quality metrics, and best practice recommendations. A written personalized care plan for preventive services as well as general preventive health recommendations were provided to patient.   Rojelio ORN  Tanda, LPN   [id number]   After Visit Summary: (MyChart) Due to this being a telephonic visit, the after visit summary with patients personalized plan was offered to patient via MyChart   Notes: Nothing significant to report at this time.

## 2024-08-05 ENCOUNTER — Ambulatory Visit (INDEPENDENT_AMBULATORY_CARE_PROVIDER_SITE_OTHER): Admitting: Family Medicine

## 2024-08-05 ENCOUNTER — Other Ambulatory Visit: Payer: Self-pay | Admitting: Family Medicine

## 2024-08-05 ENCOUNTER — Encounter: Payer: Self-pay | Admitting: Family Medicine

## 2024-08-05 VITALS — BP 124/86 | HR 72 | Temp 98.0°F | Resp 16 | Ht 62.4 in | Wt 128.4 lb

## 2024-08-05 DIAGNOSIS — Z23 Encounter for immunization: Secondary | ICD-10-CM

## 2024-08-05 DIAGNOSIS — R7303 Prediabetes: Secondary | ICD-10-CM

## 2024-08-05 DIAGNOSIS — I5020 Unspecified systolic (congestive) heart failure: Secondary | ICD-10-CM

## 2024-08-05 DIAGNOSIS — I10 Essential (primary) hypertension: Secondary | ICD-10-CM | POA: Diagnosis not present

## 2024-08-05 DIAGNOSIS — F1220 Cannabis dependence, uncomplicated: Secondary | ICD-10-CM

## 2024-08-05 DIAGNOSIS — E785 Hyperlipidemia, unspecified: Secondary | ICD-10-CM | POA: Diagnosis not present

## 2024-08-05 DIAGNOSIS — M816 Localized osteoporosis [Lequesne]: Secondary | ICD-10-CM

## 2024-08-05 DIAGNOSIS — Z0000 Encounter for general adult medical examination without abnormal findings: Secondary | ICD-10-CM | POA: Diagnosis not present

## 2024-08-05 LAB — LIPID PANEL
Cholesterol: 158 mg/dL (ref 0–200)
HDL: 75.8 mg/dL (ref >39.00)
LDL Cholesterol: 68 mg/dL (ref 0–99)
NonHDL: 82.6
Total CHOL/HDL Ratio: 2
Triglycerides: 75 mg/dL (ref 0.0–149.0)
VLDL: 15 mg/dL (ref 0.0–40.0)

## 2024-08-05 LAB — VITAMIN D 25 HYDROXY (VIT D DEFICIENCY, FRACTURES): VITD: 45.19 ng/mL (ref 30.00–100.00)

## 2024-08-05 LAB — COMPREHENSIVE METABOLIC PANEL WITH GFR
ALT: 14 U/L (ref 0–35)
AST: 20 U/L (ref 0–37)
Albumin: 4.2 g/dL (ref 3.5–5.2)
Alkaline Phosphatase: 38 U/L — ABNORMAL LOW (ref 39–117)
BUN: 23 mg/dL (ref 6–23)
CO2: 28 meq/L (ref 19–32)
Calcium: 10.1 mg/dL (ref 8.4–10.5)
Chloride: 103 meq/L (ref 96–112)
Creatinine, Ser: 1.07 mg/dL (ref 0.40–1.20)
GFR: 52.03 mL/min — ABNORMAL LOW (ref >60.00)
Glucose, Bld: 91 mg/dL (ref 70–99)
Potassium: 4.7 meq/L (ref 3.5–5.1)
Sodium: 139 meq/L (ref 135–145)
Total Bilirubin: 0.6 mg/dL (ref 0.2–1.2)
Total Protein: 7.1 g/dL (ref 6.0–8.3)

## 2024-08-05 LAB — HEMOGLOBIN A1C: Hgb A1c MFr Bld: 6.6 % — ABNORMAL HIGH (ref 4.6–6.5)

## 2024-08-05 NOTE — Assessment & Plan Note (Signed)
 We discussed the importance of regular physical activity and healthy diet for prevention of chronic illness and/or complications. Preventive guidelines reviewed. Vaccination: Prevnar 20 given today, she is not interested in flu vaccine. Ca++ and vit D supplementation to continue. Next CPE in a year.

## 2024-08-05 NOTE — Assessment & Plan Note (Signed)
 Encouraged consistency with a healthy lifestyle for diabetes prevention. Last hemoglobin A1c mildly elevated at 5.9 in 07/2023.

## 2024-08-05 NOTE — Assessment & Plan Note (Signed)
 LVEF 50-55% in 04/2024. She has been asymptomatic, euvolemic. Continue Metoprolol  succinate, Losartan ,Spironolactone ,and Jardiance  (samples given). Continue low-salt diet. Follows with cardiologist q 6 months.

## 2024-08-05 NOTE — Patient Instructions (Addendum)
 A few things to remember from today's visit:  Routine general medical examination at a health care facility  Localized osteoporosis without current pathological fracture - Plan: VITAMIN D  25 Hydroxy (Vit-D Deficiency, Fractures)  Essential (primary) hypertension - Plan: Comprehensive metabolic panel with GFR  Hyperlipidemia LDL goal <70 - Plan: Comprehensive metabolic panel with GFR, Lipid panel  Prediabetes - Plan: Hemoglobin A1c  No changes today.  If you need refills for medications you take chronically, please call your pharmacy. Do not use My Chart to request refills or for acute issues that need immediate attention. If you send a my chart message, it may take a few days to be addressed, specially if I am not in the office.  Please be sure medication list is accurate. If a new problem present, please set up appointment sooner than planned today.

## 2024-08-05 NOTE — Assessment & Plan Note (Signed)
 Still uses marijuana but occasionally.

## 2024-08-05 NOTE — Progress Notes (Signed)
 Chief Complaint  Patient presents with   Annual Exam   Discussed the use of AI scribe software for clinical note transcription with the patient, who gave verbal consent to proceed.  History of Present Illness Leah Pierce is a 73 year old female with PMHx significant for HLD, prediabetes, HTN, CVA, HFrEF, and osteoporosis who presents for an annual physical exam. Last CPE 08/04/23.  She has been under the care of a cardiologist with biannual visits.   HFrEF: Her LVEF was previously 30-35% after her stroke, 02/2021 and more recently 50-55% (05/10/24). She has not experienced any chest pain,orthopnea,PND, dyspnea,or edema. She is currently taking Jardiance  10 mg daily, which she now has to pay for due to a change in her income.  Takes Spironolactone  12.5 mg daily. She is also on Losartan  25 mg daily and Metoprolol  succinate 25 mg daily for HTN. She has tolerated medications well.  Osteoporosis on Fosamax  70 mg weekly but has missed 2 doses recently. Additionally, she takes calcium  with vitamin D  regularly.  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 Although she does not engage in formal exercise, she maintains activity through these tasks.  Her diet includes vegetables frequently, though not daily, and she consumes beer about three times a week.  She does not smoke but uses marijuana occasionally. She reports generally good sleep, averaging seven hours per night, though she sometimes wakes up in the middle of the night and has difficulty returning to sleep.  Lab Results  Component Value Date   NA 139 08/04/2023   CL 104 08/04/2023   K 4.3 08/04/2023   CO2 27 08/04/2023   BUN 14 08/04/2023   CREATININE 0.84 08/04/2023   GFR 70.05 08/04/2023   CALCIUM  9.7 08/04/2023   ALBUMIN 4.0 08/04/2023   GLUCOSE 95 08/04/2023   HLD on Atorvastatin  20 mg daily.  Lab Results  Component Value Date   CHOL 161  08/04/2023   HDL 82.60 08/04/2023   LDLCALC 64 08/04/2023   TRIG 69.0 08/04/2023   CHOLHDL 2 08/04/2023   No hx of diabetes. Her HgA1C has been mildly elevated.  Lab Results  Component Value Date   HGBA1C 5.9 (H) 03/16/2021    Immunization History  Administered Date(s) Administered   Moderna Covid-19 Fall Seasonal Vaccine 76yrs & older 09/04/2022, 08/08/2023   PFIZER(Purple Top)SARS-COV-2 Vaccination 12/08/2019, 12/29/2019, 09/01/2020   Pneumococcal Conjugate-13 04/21/2018   Pneumococcal Polysaccharide-23 02/03/2020   Health Maintenance  Topic Date Due   COVID-19 Vaccine (6 - Pfizer risk 2024-25 season) 08/21/2024 (Originally 07/18/2024)   Mammogram  07/25/2025   Medicare Annual Wellness (AWV)  08/01/2025   Colonoscopy  06/02/2027   Pneumococcal Vaccine: 50+ Years  Completed   DEXA SCAN  Completed   Hepatitis C Screening  Completed   HPV VACCINES  Aged Out   Meningococcal B Vaccine  Aged Out   DTaP/Tdap/Td  Discontinued   Influenza Vaccine  Discontinued   Zoster Vaccines- Shingrix  Discontinued   She has no concerns today.  Review of Systems  Constitutional:  Negative for activity change, appetite change and fever.  HENT:  Negative for mouth sores and sore throat.   Eyes:  Negative for redness and visual disturbance.  Respiratory:  Negative for cough, shortness of breath and wheezing.   Cardiovascular:  Negative for chest pain and leg swelling.  Gastrointestinal:  Negative for abdominal pain, nausea and vomiting.  Endocrine: Negative for cold intolerance,  heat intolerance, polydipsia, polyphagia and polyuria.  Genitourinary:  Negative for decreased urine volume, dysuria and hematuria.  Musculoskeletal:  Negative for gait problem and myalgias.  Skin:  Negative for color change and rash.  Allergic/Immunologic: Positive for environmental allergies.  Neurological:  Negative for syncope, weakness and headaches.  Hematological:  Negative for adenopathy. Does not  bruise/bleed easily.  Psychiatric/Behavioral:  Negative for confusion. The patient is not nervous/anxious.   All other systems reviewed and are negative.  Current Outpatient Medications on File Prior to Visit  Medication Sig Dispense Refill   albuterol  (VENTOLIN  HFA) 108 (90 Base) MCG/ACT inhaler Inhale 2 puffs into the lungs every 6 (six) hours as needed for wheezing or shortness of breath. 18 g 3   aspirin  EC 81 MG tablet Take 1 tablet (81 mg total) by mouth daily. Swallow whole. 30 tablet 11   atorvastatin  (LIPITOR) 20 MG tablet Take 1 tablet (20 mg total) by mouth daily. 90 tablet 3   cholecalciferol  (VITAMIN D3) 25 MCG (1000 UNIT) tablet Take 1,000 Units by mouth daily.     empagliflozin  (JARDIANCE ) 10 MG TABS tablet Take 1 tablet (10 mg total) by mouth daily. Due for follow up. 90 tablet 3   fluticasone  (FLOVENT  HFA) 110 MCG/ACT inhaler Inhale 1 puff into the lungs in the morning and at bedtime. 1 Inhaler 3   losartan  (COZAAR ) 25 MG tablet Take 1 tablet (25 mg total) by mouth daily. Due for follow up 90 tablet 3   metoprolol  succinate (TOPROL -XL) 25 MG 24 hr tablet TAKE 1/2 TABLET BY MOUTH EVERY DAY 45 tablet 1   Misc Natural Products (OSTEO BI-FLEX ADV JOINT SHIELD) TABS Take 1 tablet by mouth daily.     OVER THE COUNTER MEDICATION Take 1 tablet by mouth daily. NEURIVA     Spacer/Aero-Holding Chambers (AEROCHAMBER PLUS) inhaler Use as instructed to use with inahaler. 1 each 1   spironolactone  (ALDACTONE ) 25 MG tablet TAKE 1/2 TABLET BY MOUTH EVERY DAY 45 tablet 1   vitamin C (ASCORBIC ACID ) 500 MG tablet Take 500 mg by mouth daily.     vitamin E 200 UNIT capsule Take 200 Units by mouth daily.     No current facility-administered medications on file prior to visit.   Past Medical History:  Diagnosis Date   Allergy    Arthritis    Asthma    hx of   Chicken pox    Heart murmur    Migraines    Stroke [facility])    Past Surgical History:  Procedure Laterality Date   BUBBLE STUDY   03/18/2021   Procedure: BUBBLE STUDY;  Surgeon: Pietro Redell RAMAN, MD;  Location: [facility] ENDOSCOPY;  Service: Cardiovascular;;   CESAREAN SECTION     COLONOSCOPY     TEE WITHOUT CARDIOVERSION N/A 03/18/2021   Procedure: TRANSESOPHAGEAL ECHOCARDIOGRAM (TEE);  Surgeon: Pietro Redell RAMAN, MD;  Location: [facility] ENDOSCOPY;  Service: Cardiovascular;  Laterality: N/A;   TONSILLECTOMY  1959    Allergies  Allergen Reactions   Tetracyclines & Related     Rash on hands   Family History  Problem Relation Age of Onset   Arthritis Mother    Stroke Mother    Hypertension Mother    Diabetes Mother    Colon polyps Mother    Arthritis Father    Cancer Father        Prostate   Cancer Brother        prostate   Cancer Brother  Pancreas   Colon cancer Neg Hx    Esophageal cancer Neg Hx    Rectal cancer Neg Hx    Stomach cancer Neg Hx     Social History   Socioeconomic History   Marital status: Single    Spouse name: Not on file   Number of children: 1   Years of education: Not on file   Highest education level: Not on file  Occupational History   Not on file  Tobacco Use   Smoking status: Never   Smokeless tobacco: Never  Vaping Use   Vaping status: Never Used  Substance and Sexual Activity   Alcohol use: Yes    Comment: occasionally   Drug use: Yes    Frequency: 1.0 times per week    Types: Marijuana   Sexual activity: Not on file  Other Topics Concern   Not on file  Social History Narrative   Lives alone, her son dies   Right Handed   Drinks 1 cups caffeine daily   Social Drivers of Health   Financial Resource Strain: Low Risk  (08/01/2024)   Overall Financial Resource Strain (CARDIA)    Difficulty of Paying Living Expenses: Not hard at all  Food Insecurity: No Food Insecurity (08/01/2024)   Hunger Vital Sign    Worried About Running Out of Food in the Last Year: Never true    Ran Out of Food in the Last Year: Never true  Transportation Needs: No Transportation Needs  (08/01/2024)   PRAPARE - Administrator, Civil Service (Medical): No    Lack of Transportation (Non-Medical): No  Physical Activity: Inactive (08/01/2024)   Exercise Vital Sign    Days of Exercise per Week: 0 days    Minutes of Exercise per Session: 0 min  Stress: No Stress Concern Present (08/01/2024)   [employer] of Occupational Health - Occupational Stress Questionnaire    Feeling of Stress: Not at all  Social Connections: Moderately Integrated (08/01/2024)   Social Connection and Isolation Panel    Frequency of Communication with Friends and Family: More than three times a week    Frequency of Social Gatherings with Friends and Family: More than three times a week    Attends Religious Services: More than 4 times per year    Active Member of Clubs or Organizations: Yes    Attends Banker Meetings: More than 4 times per year    Marital Status: Divorced   Vitals:   08/05/24 1100  BP: 124/86  Pulse: 72  Resp: 16  Temp: 98 F (36.7 C)  SpO2: 99%   Body mass index is 23.18 kg/m.  Wt Readings from Last 3 Encounters:  08/05/24 128 lb 6.4 oz (58.2 kg)  08/01/24 127 lb (57.6 kg)  05/12/24 127 lb (57.6 kg)   Physical Exam Vitals and nursing note reviewed.  Constitutional:      General: She is not in acute distress.    Appearance: She is well-developed.  HENT:     Head: Normocephalic and atraumatic.     Right Ear: External ear normal. Tympanic membrane is not erythematous.     Left Ear: Tympanic membrane, ear canal and external ear normal.     Ears:     Comments: Excess cerumen right ear canal, can see TM.    Mouth/Throat:     Mouth: Mucous membranes are moist.     Pharynx: Oropharynx is clear. Uvula midline.  Eyes:     Conjunctiva/sclera: Conjunctivae normal.  Pupils: Pupils are equal, round, and reactive to light.  Neck:     Thyroid : No thyroid  mass or thyromegaly.  Cardiovascular:     Rate and Rhythm: Normal rate and regular rhythm.      Pulses:          Dorsalis pedis pulses are 2+ on the right side and 2+ on the left side.     Heart sounds: No murmur heard. Pulmonary:     Effort: Pulmonary effort is normal. No respiratory distress.     Breath sounds: Normal breath sounds.  Abdominal:     Palpations: Abdomen is soft. There is no hepatomegaly or mass.     Tenderness: There is no abdominal tenderness.  Musculoskeletal:     Comments: No signs of synovitis appreciated.  Lymphadenopathy:     Cervical: No cervical adenopathy.  Skin:    General: Skin is warm.     Findings: No erythema or rash.  Neurological:     General: No focal deficit present.     Mental Status: She is alert and oriented to person, place, and time.     Cranial Nerves: No cranial nerve deficit.     Coordination: Coordination normal.     Gait: Gait normal.     Deep Tendon Reflexes:     Reflex Scores:      Bicep reflexes are 2+ on the right side and 2+ on the left side.      Patellar reflexes are 2+ on the right side and 2+ on the left side. Psychiatric:        Mood and Affect: Mood and affect normal.    ASSESSMENT AND PLAN: Leah Pierce was here today annual physical examination.  Orders Placed This Encounter  Procedures   Comprehensive metabolic panel with GFR   Lipid panel   Hemoglobin A1c   VITAMIN D  25 Hydroxy (Vit-D Deficiency, Fractures)   Lab Results  Component Value Date   HGBA1C 6.6 (H) 08/05/2024   Lab Results  Component Value Date   NA 139 08/05/2024   CL 103 08/05/2024   K 4.7 08/05/2024   CO2 28 08/05/2024   BUN 23 08/05/2024   CREATININE 1.07 08/05/2024   GFR 52.03 (L) 08/05/2024   CALCIUM  10.1 08/05/2024   ALBUMIN 4.2 08/05/2024   GLUCOSE 91 08/05/2024   Lab Results  Component Value Date   ALT 14 08/05/2024   AST 20 08/05/2024   ALKPHOS 38 (L) 08/05/2024   BILITOT 0.6 08/05/2024   Lab Results  Component Value Date   VD25OH 45.19 08/05/2024   Routine general medical examination at a health  care facility Assessment & Plan: We discussed the importance of regular physical activity and healthy diet for prevention of chronic illness and/or complications. Preventive guidelines reviewed. Vaccination: Prevnar 20 given today, she is not interested in flu vaccine. Ca++ and vit D supplementation to continue. Next CPE in a year.   Localized osteoporosis without current pathological fracture Assessment & Plan: Currently on Fosamax  70 mg weekly, sometimes she forgets to take medication. Continue adequate calcium  and vitamin D  intake as well as low impact regular physical activity and fall prevention.  Orders: -     VITAMIN D  25 Hydroxy (Vit-D Deficiency, Fractures); Future  Essential (primary) hypertension -     Comprehensive metabolic panel with GFR; Future  Hyperlipidemia LDL goal <70 Assessment & Plan: Currently on atorvastatin  20 mg daily. Continue low-fat diet. Further recommendation will be given according to lipid panel result.  Orders: -     Comprehensive metabolic panel with GFR; Future -     Lipid panel; Future  Prediabetes Assessment & Plan: Encouraged consistency with a healthy lifestyle for diabetes prevention. Last hemoglobin A1c mildly elevated at 5.9 in 07/2023.  Orders: -     Hemoglobin A1c; Future  HFrEF (heart failure with reduced ejection fraction) (HCC) Assessment & Plan: LVEF 50-55% in 04/2024. She has been asymptomatic, euvolemic. Continue Metoprolol  succinate, Losartan ,Spironolactone ,and Jardiance  (samples given). Continue low-salt diet. Follows with cardiologist q 6 months.   Cannabis dependence, uncomplicated (HCC) Assessment & Plan: Still uses marijuana but occasionally.   Need for pneumococcal vaccination -     Pneumococcal conjugate vaccine 20-valent   Return in 1 year (on 08/05/2025) for CPE.  Doyl Bitting G. [place], MD  [facility]. [place] office.

## 2024-08-05 NOTE — Assessment & Plan Note (Signed)
 Currently on Fosamax  70 mg weekly, sometimes she forgets to take medication. Continue adequate calcium  and vitamin D  intake as well as low impact regular physical activity and fall prevention.

## 2024-08-05 NOTE — Assessment & Plan Note (Signed)
 Currently on atorvastatin  20 mg daily. Continue low-fat diet. Further recommendation will be given according to lipid panel result.

## 2024-08-06 ENCOUNTER — Encounter: Payer: Self-pay | Admitting: Family Medicine

## 2024-08-06 ENCOUNTER — Ambulatory Visit: Payer: Self-pay | Admitting: Family Medicine

## 2024-08-06 DIAGNOSIS — E119 Type 2 diabetes mellitus without complications: Secondary | ICD-10-CM | POA: Insufficient documentation

## 2024-08-06 DIAGNOSIS — E1169 Type 2 diabetes mellitus with other specified complication: Secondary | ICD-10-CM

## 2024-08-06 NOTE — Assessment & Plan Note (Signed)
 BP adequately controlled. Continue same dose Metoprolol  succinate,spironolactone , and Losartan . Low salt diet to continue. Continue monitoring BP at home. Eye exam is current.

## 2024-08-08 ENCOUNTER — Telehealth: Payer: Self-pay | Admitting: *Deleted

## 2024-08-08 NOTE — Telephone Encounter (Signed)
 Copied from CRM #[id number]. Topic: Clinical - Lab/Test Results >> Aug 08, 2024 10:24 AM Franky GRADE wrote: Reason for CRM: Patient received a call from the Nutritionist office to schedule an appointment; however, she does not remember being referred. After reviewing the patient's chart I advised the patient of the message from Dr.Jordan regarding lab results from 08/05/2024. I read the message to the patient she understood and had no questions. She will follow up with the nutritionist  to schedule an appointment.

## 2024-08-08 NOTE — Telephone Encounter (Signed)
 Noted   please see result note

## 2024-08-11 ENCOUNTER — Other Ambulatory Visit: Payer: Self-pay | Admitting: Internal Medicine

## 2024-08-11 ENCOUNTER — Other Ambulatory Visit: Payer: Self-pay | Admitting: Family Medicine

## 2024-08-11 DIAGNOSIS — I5020 Unspecified systolic (congestive) heart failure: Secondary | ICD-10-CM

## 2024-08-11 DIAGNOSIS — I10 Essential (primary) hypertension: Secondary | ICD-10-CM

## 2024-08-11 DIAGNOSIS — E785 Hyperlipidemia, unspecified: Secondary | ICD-10-CM

## 2024-08-12 ENCOUNTER — Ambulatory Visit

## 2024-09-05 NOTE — Progress Notes (Deleted)
  Start: *** end: *** Patient is here today *** Patient would like to learn *** Patient lives with ***.  *** shopping and cooking.  History includes:  *** Medications include:  *** Labs noted:  ***   6.6%, jardiance    No questionnaires on file. Pt's ejection fraction of 50-55% per cardiology progress notes 04/2024.

## 2024-09-10 ENCOUNTER — Other Ambulatory Visit: Payer: Self-pay | Admitting: Family Medicine

## 2024-09-10 DIAGNOSIS — I5020 Unspecified systolic (congestive) heart failure: Secondary | ICD-10-CM

## 2024-09-14 ENCOUNTER — Encounter: Attending: Family Medicine | Admitting: Dietician

## 2024-09-14 ENCOUNTER — Ambulatory Visit: Admitting: Dietician

## 2024-09-14 DIAGNOSIS — E1169 Type 2 diabetes mellitus with other specified complication: Secondary | ICD-10-CM

## 2024-09-14 NOTE — Progress Notes (Signed)
 Diabetes Self-Management Education  Visit Type: First/Initial  Appt. Start Time: 1443 Appt. End Time: 1552  09/14/2024  Leah Pierce, identified by name and date of birth, is a 73 y.o. female with a diagnosis of Diabetes: Type 2.   ASSESSMENT  Patient is here today alone. Pt reports a family history of diabetes. Patient would like to learn more about diabetes and A1C%.  Patient lives with alone and does and shopping and cooking. Pt reports she works part time mostly standing. Pt reports following a NAS meal pattern. Pt reports typical intake of 2 meals daily with intake of a Boost about 4-5 times weekly. Pt reports she is lactose intolerant and selects soy milk and can tolerate yogurt and cheese.  Pt reports she eats out 2 times monthly.  All Pt's questions were answered during this encounter.  History includes:  Past Medical History:  Diagnosis Date   Allergy    Arthritis    Asthma    hx of   Chicken pox    Diabetes mellitus (HCC) 08/06/2024   Heart murmur    Migraines    Stroke (HCC)     Medications include:   Current Outpatient Medications:    albuterol  (VENTOLIN  HFA) 108 (90 Base) MCG/ACT inhaler, Inhale 2 puffs into the lungs every 6 (six) hours as needed for wheezing or shortness of breath., Disp: 18 g, Rfl: 3   alendronate  (FOSAMAX ) 70 MG tablet, TAKE 1 TABLET (70 MG TOTAL) BY MOUTH EVERY 7 DAYS WITH FULL GLASS WATER ON EMPTY STOMACH, Disp: 12 tablet, Rfl: 4   aspirin  EC 81 MG tablet, Take 1 tablet (81 mg total) by mouth daily. Swallow whole., Disp: 30 tablet, Rfl: 11   atorvastatin  (LIPITOR) 20 MG tablet, Take 1 tablet (20 mg total) by mouth daily., Disp: 90 tablet, Rfl: 3   cholecalciferol  (VITAMIN D3) 25 MCG (1000 UNIT) tablet, Take 1,000 Units by mouth daily., Disp: , Rfl:    fluticasone  (FLOVENT  HFA) 110 MCG/ACT inhaler, Inhale 1 puff into the lungs in the morning and at bedtime., Disp: 1 Inhaler, Rfl: 3   JARDIANCE  10 MG TABS tablet, TAKE 1 TABLET (10 MG TOTAL) BY  MOUTH DAILY. DUE FOR FOLLOW UP., Disp: 90 tablet, Rfl: 3   losartan  (COZAAR ) 25 MG tablet, TAKE 1 TABLET (25 MG TOTAL) BY MOUTH DAILY. DUE FOR FOLLOW UP, Disp: 90 tablet, Rfl: 3   metoprolol  succinate (TOPROL -XL) 25 MG 24 hr tablet, TAKE 1/2 TABLET BY MOUTH EVERY DAY, Disp: 45 tablet, Rfl: 1   Misc Natural Products (OSTEO BI-FLEX ADV JOINT SHIELD) TABS, Take 1 tablet by mouth daily., Disp: , Rfl:    OVER THE COUNTER MEDICATION, Take 1 tablet by mouth daily. NEURIVA, Disp: , Rfl:    Spacer/Aero-Holding Chambers (AEROCHAMBER PLUS) inhaler, Use as instructed to use with inahaler., Disp: 1 each, Rfl: 1   spironolactone  (ALDACTONE ) 25 MG tablet, TAKE 1/2 TABLET BY MOUTH EVERY DAY, Disp: 45 tablet, Rfl: 1   vitamin C (ASCORBIC ACID ) 500 MG tablet, Take 500 mg by mouth daily., Disp: , Rfl:    vitamin E 200 UNIT capsule, Take 200 Units by mouth daily., Disp: , Rfl:    Labs noted:   Lab Results  Component Value Date   HGBA1C 6.6 (H) 08/05/2024   Lab Results  Component Value Date   CHOL 158 08/05/2024   HDL 75.80 08/05/2024   LDLCALC 68 08/05/2024   TRIG 75.0 08/05/2024   CHOLHDL 2 08/05/2024   Lab Results  Component  Value Date   NA 139 08/05/2024   CL 103 08/05/2024   K 4.7 08/05/2024   CO2 28 08/05/2024   BUN 23 08/05/2024   CREATININE 1.07 08/05/2024   GFR 52.03 (L) 08/05/2024   CALCIUM  10.1 08/05/2024   ALBUMIN 4.2 08/05/2024   GLUCOSE 91 08/05/2024   Labs Other:  Pt's ejection fraction of 50-55% per cardiology progress note on 04/2024.  Wt Readings from Last 3 Encounters:  08/05/24 128 lb 6.4 oz (58.2 kg)  08/01/24 127 lb (57.6 kg)  05/12/24 127 lb (57.6 kg)    Diabetes Self-Management Education - 09/14/24 1450       Visit Information   Visit Type First/Initial      Initial Visit   Diabetes Type Type 2    Date Diagnosed 2025    Are you currently following a meal plan? No    Are you taking your medications as prescribed? Yes      Health Coping   How would you rate  your overall health? Good      Psychosocial Assessment   Patient Belief/Attitude about Diabetes Motivated to manage diabetes    What is the hardest part about your diabetes right now, causing you the most concern, or is the most worrisome to you about your diabetes?   Getting support / problem solving;Other (comment)   newly diagnosed   Self-care barriers None    Self-management support Doctor's office    Other persons present Patient    Patient Concerns Nutrition/Meal planning;Support;Weight Control    Special Needs None    Preferred Learning Style No preference indicated    Learning Readiness Contemplating    How often do you need to have someone help you when you read instructions, pamphlets, or other written materials from your doctor or pharmacy? 1 - Never    What is the last grade level you completed in school? masters      Pre-Education Assessment   Patient understands the diabetes disease and treatment process. Needs Instruction    Patient understands incorporating nutritional management into lifestyle. Needs Instruction    Patient undertands incorporating physical activity into lifestyle. Needs Instruction    Patient understands using medications safely. Needs Instruction    Patient understands monitoring blood glucose, interpreting and using results Needs Instruction    Patient understands prevention, detection, and treatment of acute complications. Needs Instruction    Patient understands prevention, detection, and treatment of chronic complications. Needs Instruction    Patient understands how to develop strategies to address psychosocial issues. Needs Instruction    Patient understands how to develop strategies to promote health/change behavior. Needs Instruction      Complications   Last HgB A1C per patient/outside source 6.6 %    How often do you check your blood sugar? 0 times/day (not testing)    Have you had a dilated eye exam in the past 12 months? Yes    Have you had  a dental exam in the past 12 months? Yes    Are you checking your feet? No      Dietary Intake   Breakfast 1 salmon patty,1 eggs, 1 whole wheat, water, coffee with creamer and 2-3 teaspoons sugar or soy milk, raisin bran    Lunch Boost    Dinner 1 cup fried rice, broccoli, shrimp, water or breaded and fried pork chop with gravy, baked potato, broocoli    Snack (evening) Boost    Beverage(s) water, coffee with creamer and 2-3 teaspoons sugar, soy milk  Activity / Exercise   Activity / Exercise Type ADL's;Other (comment)   walking standing at work     Patient Education   Previous Diabetes Education No    Disease Pathophysiology Definition of diabetes, type 1 and 2, and the diagnosis of diabetes    Healthy Eating Plate Method;Carbohydrate counting;Role of diet in the treatment of diabetes and the relationship between the three main macronutrients and blood glucose level;Food label reading, portion sizes and measuring food.    Being Active Role of exercise on diabetes management, blood pressure control and cardiac health.    Medications Reviewed patients medication for diabetes, action, purpose, timing of dose and side effects.    Monitoring Identified appropriate SMBG and/or A1C goals.;Daily foot exams    Acute complications Taught prevention, symptoms, and  treatment of hypoglycemia - the 15 rule.    Chronic complications Relationship between chronic complications and blood glucose control    Diabetes Stress and Support Identified and addressed patients feelings and concerns about diabetes    Preconception care Other (comment)   n/a   Lifestyle and Health Coping Lifestyle issues that need to be addressed for better diabetes care      Individualized Goals (developed by patient)   Nutrition Follow meal plan discussed    Physical Activity --   per cardiology   Medications take my medication as prescribed    Monitoring  Not Applicable    Problem Solving Eating Pattern    Reducing Risk  do foot checks daily    Health Coping Ask for help with psychological, social, or emotional issues      Post-Education Assessment   Patient understands the diabetes disease and treatment process. Needs Review    Patient understands incorporating nutritional management into lifestyle. Needs Review    Patient undertands incorporating physical activity into lifestyle. Needs Review    Patient understands using medications safely. Needs Review    Patient understands monitoring blood glucose, interpreting and using results Needs Review    Patient understands prevention, detection, and treatment of acute complications. Needs Review    Patient understands prevention, detection, and treatment of chronic complications. Needs Review    Patient understands how to develop strategies to address psychosocial issues. Needs Review    Patient understands how to develop strategies to promote health/change behavior. Needs Review      Outcomes   Expected Outcomes Demonstrated interest in learning. Expect positive outcomes    Future DMSE 3-4 months    Program Status Not Completed          Individualized Plan for Diabetes Self-Management Training:   Learning Objective:  Patient will have a greater understanding of diabetes self-management. Patient education plan is to attend individual and/or group sessions per assessed needs and concerns.   Plan:   Patient Instructions  Increase water intake and decrease sugary sweetened beverages    Expected Outcomes:  Demonstrated interest in learning. Expect positive outcomes  Education material provided: ADA - How to Thrive: A Guide for Your Journey with Diabetes, My Plate, Snack sheet, Support group flyer, and Diabetes Resources  If problems or questions, patient to contact team via:  Phone  Future DSME appointment: 3-4 months

## 2024-09-14 NOTE — Patient Instructions (Signed)
 Increase water intake and decrease sugary sweetened beverages

## 2024-09-20 ENCOUNTER — Other Ambulatory Visit: Payer: Self-pay | Admitting: Family Medicine

## 2024-09-20 ENCOUNTER — Ambulatory Visit: Admitting: Dietician

## 2024-09-20 DIAGNOSIS — E785 Hyperlipidemia, unspecified: Secondary | ICD-10-CM

## 2024-09-20 MED ORDER — ATORVASTATIN CALCIUM 20 MG PO TABS: 20.0000 mg | ORAL_TABLET | Freq: Every day | ORAL | 3 refills | Status: AC

## 2024-09-20 NOTE — Telephone Encounter (Signed)
 Copied from CRM [id number]. Topic: Clinical - Medication Refill >> Sep 20, 2024  8:53 AM Eva FALCON wrote: Medication:  atorvastatin  (LIPITOR) 20 MG tablet    Has the patient contacted their pharmacy? Yes, was told to call office.  (Agent: If no, request that the patient contact the pharmacy for the refill. If patient does not wish to contact the pharmacy document the reason why and proceed with request.) (Agent: If yes, when and what did the pharmacy advise?)  This is the patient's preferred pharmacy:  [facility] [phone] [address], [place] - [address] [place] [address] Phone: [phone] Fax: [phone]  Is this the correct pharmacy for this prescription? Yes If no, delete pharmacy and type the correct one.   Has the prescription been filled recently? Yes  Is the patient out of the medication? Yes, she just took her last pill this morning.  Has the patient been seen for an appointment in the last year OR does the patient have an upcoming appointment? Yes  Can we respond through MyChart? No  Agent: Please be advised that Rx refills may take up to 3 business days. We ask that you follow-up with your pharmacy.

## 2024-11-08 ENCOUNTER — Ambulatory Visit: Attending: Internal Medicine | Admitting: Internal Medicine

## 2024-11-08 VITALS — BP 110/72 | HR 70 | Ht 62.5 in | Wt 129.0 lb

## 2024-11-08 DIAGNOSIS — I10 Essential (primary) hypertension: Secondary | ICD-10-CM

## 2024-11-08 DIAGNOSIS — I341 Nonrheumatic mitral (valve) prolapse: Secondary | ICD-10-CM | POA: Diagnosis not present

## 2024-11-08 DIAGNOSIS — I5020 Unspecified systolic (congestive) heart failure: Secondary | ICD-10-CM | POA: Diagnosis not present

## 2024-11-08 DIAGNOSIS — I63132 Cerebral infarction due to embolism of left carotid artery: Secondary | ICD-10-CM

## 2024-11-08 DIAGNOSIS — E785 Hyperlipidemia, unspecified: Secondary | ICD-10-CM | POA: Diagnosis not present

## 2024-11-08 DIAGNOSIS — I340 Nonrheumatic mitral (valve) insufficiency: Secondary | ICD-10-CM | POA: Diagnosis not present

## 2024-11-08 NOTE — Progress Notes (Signed)
 " Cardiology Office Note:  .   Date:  11/08/2024  ID:  Leah Pierce, DOB 1951/03/26, MRN [id number] PCP: Jordan, Betty G, MD  [facility] Providers Cardiologist:  Soyla DELENA Merck, MD    History of Present Illness: Leah   Porshia Pierce is a 73 y.o. female.  Discussed the use of AI scribe software for clinical note transcription with the patient, who gave verbal consent to proceed.  History of Present Illness Leah Pierce is a 73 year old female with mitral valve regurgitation who presents for routine follow-up. 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 Her recent echocardiogram shows a preserved ejection fraction of 50-55%, consistent with previous results. A cardiac MRI previously indicated borderline reduced cardiac function at 45%.   A heart monitor in 2022 was negative for atrial fibrillation, and she does not have regular symptoms of palpitations  She reports decreased energy and occasional awareness of her heartbeat that improves with rest. She denies significant palpitations, severe shortness of breath, fluid retention, or unexplained fatigue.  She recalls an echocardiogram this summer to monitor her mitral regurgitation and has not noticed symptom worsening since that study.  She takes daily baby aspirin  for prior stroke, atorvastatin  20 mg, Jardiance  10 mg, losartan  25 mg, metoprolol  succinate 12.5 mg, and spironolactone  12.5 mg.  Her weight is stable and she is trying to gain weight. She wears compression socks regularly.  Her family history is notable for a son who had valve replacement and died from kidney failure.     ROS: negative except per HPI above.  Studies Reviewed: Leah   EKG Interpretation Date/Time:  Tuesday November 08 2024 09:37:59 EST Ventricular Rate:  70 PR Interval:  160 QRS Duration:  84 QT Interval:  426 QTC Calculation: 460 R  Axis:   48  Text Interpretation: Normal sinus rhythm Nonspecific T wave abnormality When compared with ECG of 12-May-2024 14:02, No significant change was found Confirmed by Merck Soyla ([id number]) on 11/08/2024 9:57:22 AM    Results Labs LDL: 68  Diagnostic EKG (11/08/2024): No significant interval changes; no arrhythmia; normal for this patient. (Independently interpreted) Echocardiogram (Summer 2025): Moderate to severe mitral regurgitation; left ventricular systolic function preserved. Risk Assessment/Calculations:       Physical Exam:   VS:  BP 110/72 (BP Location: Left Arm, Patient Position: Sitting, Cuff Size: Normal)   Pulse 70   Ht 5' 2.5 (1.588 m)   Wt 129 lb (58.5 kg)   SpO2 99%   BMI 23.22 kg/m    Wt Readings from Last 3 Encounters:  11/08/24 129 lb (58.5 kg)  08/05/24 128 lb 6.4 oz (58.2 kg)  08/01/24 127 lb (57.6 kg)     Physical Exam GENERAL: Alert, cooperative, well developed, no acute distress. HEENT: Normocephalic, normal oropharynx, moist mucous membranes. CHEST: Clear to auscultation bilaterally, no wheezes, rhonchi, or crackles. CARDIOVASCULAR: Normal heart rate and rhythm, S1 and S2 normal, heart murmur not loud. ABDOMEN: Soft, non-tender, non-distended, without organomegaly, normal bowel sounds. EXTREMITIES: No cyanosis or edema. NEUROLOGICAL: Cranial nerves grossly intact, moves all extremities without gross motor or sensory deficit.   ASSESSMENT AND PLAN: .    Assessment and Plan Assessment & Plan Myxomatous mitral valve with mod-severe mitral valve regurgitation Moderate to severe leakage. No significant EKG changes. Adequate heart function. Surgical intervention when symptoms worsen. - Scheduled echocardiogram for June. Needs echo every 6-12 mo.  -  Instructed to report symptoms such as fluid retention, severe dyspnea, extreme fatigue, unexplained syncope, or palpitations. - Monitor for signs of atrial fibrillation as an indicator for  potential valve repair.  HFrecEF Heart Failure Therapy Continue Jardiance  10 mg, losartan  25 mg, metoprolol  succinate 12.5 mg, and spironolactone  12.5 mg.  Essential hypertension Blood pressure generally well-controlled with occasional slight elevations. No significant concerns. - Continue current antihypertensive regimen.  losartan  25 mg, metoprolol  succinate 12.5 mg, and spironolactone  12.5 mg.  Hyperlipidemia Cholesterol levels well-managed with atorvastatin . LDL at 68, indicating good control. - Continue atorvastatin  20 mg daily.  History of ischemic stroke Currently on baby aspirin  for secondary prevention. - Continue baby aspirin  daily.        Soyla Merck, MD, [facility] "

## 2024-11-08 NOTE — Patient Instructions (Signed)
 Medication Instructions:  No Changes  Testing/Procedures: Your physician has requested that you have an echocardiogram. This is already scheduled for 05/12/2025 here at [address] Echocardiography is a painless test that uses sound waves to create images of your heart. It provides your doctor with information about the size and shape of your heart and how well your hearts chambers and valves are working. This procedure takes approximately one hour. There are no restrictions for this procedure. Please do NOT wear cologne, perfume, aftershave, or lotions (deodorant is allowed). Please arrive 15 minutes prior to your appointment time.  Please note: We ask at that you not bring children with you during ultrasound (echo/ vascular) testing. Due to room size and safety concerns, children are not allowed in the ultrasound rooms during exams. Our front office staff cannot provide observation of children in our lobby area while testing is being conducted. An adult accompanying a patient to their appointment will only be allowed in the ultrasound room at the discretion of the ultrasound technician under special circumstances. We apologize for any inconvenience.   Follow-Up: At [facility], you and your health needs are our priority.  As part of our continuing mission to provide you with exceptional heart care, our providers are all part of one team.  This team includes your primary Cardiologist (physician) and Advanced Practice Providers or APPs (Physician Assistants and Nurse Practitioners) who all work together to provide you with the care you need, when you need it.  Your next appointment:    Last week of June  2026 (AFTER the Echocardiogram); please call in February for an appointment.   Provider:   Gayatri A Acharya, MD   We recommend signing up for the patient portal called MyChart.  Sign up information is provided on this After Visit Summary.  MyChart is used to connect with  patients for Virtual Visits (Telemedicine).  Patients are able to view lab/test results, encounter notes, upcoming appointments, etc.  Non-urgent messages can be sent to your provider as well.   To learn more about what you can do with MyChart, go to [email or web address].

## 2024-12-06 LAB — BASIC METABOLIC PANEL WITH GFR
CO2: 29 — AB (ref 13–22)
Chloride: 104 (ref 99–108)
Creatinine: 0.8 (ref 0.5–1.1)
Glucose: 75
Potassium: 4.3 meq/L (ref 3.5–5.1)
Sodium: 138 (ref 137–147)

## 2024-12-06 LAB — COMPREHENSIVE METABOLIC PANEL WITH GFR
Albumin: 4.4 (ref 3.5–5.0)
Calcium: 9.2 (ref 8.7–10.7)
eGFR: 79

## 2024-12-06 LAB — MICROALBUMIN / CREATININE URINE RATIO: Microalb Creat Ratio: 0.2

## 2024-12-06 LAB — HEPATIC FUNCTION PANEL
ALT: 19 U/L (ref 7–35)
AST: 19 (ref 13–35)
Alkaline Phosphatase: 45 (ref 25–125)

## 2024-12-13 LAB — LAB REPORT - SCANNED
Creatinine, POC: 8 mg/dL
EGFR: 79

## 2024-12-23 ENCOUNTER — Encounter: Payer: Self-pay | Admitting: Family Medicine

## 2025-05-12 ENCOUNTER — Other Ambulatory Visit (HOSPITAL_COMMUNITY)

## 2025-08-07 ENCOUNTER — Ambulatory Visit
